# Patient Record
Sex: Female | Born: 1945 | Race: White | Hispanic: No | Smoking: Never smoker
Health system: Southern US, Community
[De-identification: ages and names within clinical notes are randomized; demographics above are authoritative.]

## PROBLEM LIST (undated history)

## (undated) DIAGNOSIS — G459 Transient cerebral ischemic attack, unspecified: Secondary | ICD-10-CM

## (undated) DIAGNOSIS — M1990 Unspecified osteoarthritis, unspecified site: Secondary | ICD-10-CM

## (undated) DIAGNOSIS — I10 Essential (primary) hypertension: Secondary | ICD-10-CM

## (undated) DIAGNOSIS — F329 Major depressive disorder, single episode, unspecified: Secondary | ICD-10-CM

## (undated) DIAGNOSIS — E785 Hyperlipidemia, unspecified: Secondary | ICD-10-CM

## (undated) DIAGNOSIS — R112 Nausea with vomiting, unspecified: Secondary | ICD-10-CM

## (undated) DIAGNOSIS — Z8489 Family history of other specified conditions: Secondary | ICD-10-CM

## (undated) DIAGNOSIS — I639 Cerebral infarction, unspecified: Secondary | ICD-10-CM

## (undated) DIAGNOSIS — T7840XA Allergy, unspecified, initial encounter: Secondary | ICD-10-CM

## (undated) DIAGNOSIS — R160 Hepatomegaly, not elsewhere classified: Secondary | ICD-10-CM

## (undated) DIAGNOSIS — M797 Fibromyalgia: Secondary | ICD-10-CM

## (undated) DIAGNOSIS — K7460 Unspecified cirrhosis of liver: Secondary | ICD-10-CM

## (undated) DIAGNOSIS — I700 Atherosclerosis of aorta: Secondary | ICD-10-CM

## (undated) DIAGNOSIS — R42 Dizziness and giddiness: Secondary | ICD-10-CM

## (undated) DIAGNOSIS — T753XXA Motion sickness, initial encounter: Secondary | ICD-10-CM

## (undated) DIAGNOSIS — E039 Hypothyroidism, unspecified: Secondary | ICD-10-CM

## (undated) DIAGNOSIS — K219 Gastro-esophageal reflux disease without esophagitis: Secondary | ICD-10-CM

## (undated) DIAGNOSIS — E079 Disorder of thyroid, unspecified: Secondary | ICD-10-CM

## (undated) DIAGNOSIS — E119 Type 2 diabetes mellitus without complications: Secondary | ICD-10-CM

## (undated) DIAGNOSIS — Z9889 Other specified postprocedural states: Secondary | ICD-10-CM

## (undated) DIAGNOSIS — R519 Headache, unspecified: Secondary | ICD-10-CM

## (undated) DIAGNOSIS — R51 Headache: Secondary | ICD-10-CM

## (undated) DIAGNOSIS — F32A Depression, unspecified: Secondary | ICD-10-CM

## (undated) HISTORY — PX: SPINE SURGERY: SHX786

## (undated) HISTORY — DX: Disorder of thyroid, unspecified: E07.9

## (undated) HISTORY — DX: Depression, unspecified: F32.A

## (undated) HISTORY — PX: CARPAL TUNNEL RELEASE: SHX101

## (undated) HISTORY — PX: ABDOMINAL HYSTERECTOMY: SHX81

## (undated) HISTORY — DX: Hepatomegaly, not elsewhere classified: R16.0

## (undated) HISTORY — DX: Hyperlipidemia, unspecified: E78.5

## (undated) HISTORY — DX: Essential (primary) hypertension: I10

## (undated) HISTORY — DX: Gastro-esophageal reflux disease without esophagitis: K21.9

## (undated) HISTORY — DX: Allergy, unspecified, initial encounter: T78.40XA

## (undated) HISTORY — DX: Atherosclerosis of aorta: I70.0

## (undated) HISTORY — DX: Fibromyalgia: M79.7

## (undated) HISTORY — DX: Unspecified cirrhosis of liver: K74.60

## (undated) HISTORY — DX: Major depressive disorder, single episode, unspecified: F32.9

## (undated) HISTORY — DX: Cerebral infarction, unspecified: I63.9

---

## 1973-06-29 HISTORY — PX: VARICOSE VEIN SURGERY: SHX832

## 2004-07-07 ENCOUNTER — Emergency Department: Payer: Self-pay | Admitting: General Practice

## 2005-10-15 ENCOUNTER — Emergency Department: Payer: Self-pay | Admitting: Emergency Medicine

## 2007-09-26 ENCOUNTER — Other Ambulatory Visit: Payer: Self-pay

## 2007-09-26 ENCOUNTER — Inpatient Hospital Stay: Payer: Self-pay | Admitting: Internal Medicine

## 2008-08-15 ENCOUNTER — Ambulatory Visit: Payer: Self-pay | Admitting: Gastroenterology

## 2009-01-01 ENCOUNTER — Emergency Department (HOSPITAL_COMMUNITY): Admission: EM | Admit: 2009-01-01 | Discharge: 2009-01-01 | Payer: Self-pay | Admitting: Emergency Medicine

## 2009-05-26 ENCOUNTER — Emergency Department: Payer: Self-pay | Admitting: Unknown Physician Specialty

## 2010-01-18 ENCOUNTER — Emergency Department: Payer: Self-pay | Admitting: Emergency Medicine

## 2010-02-12 ENCOUNTER — Encounter: Payer: Self-pay | Admitting: Anesthesiology

## 2010-02-27 ENCOUNTER — Encounter: Payer: Self-pay | Admitting: Anesthesiology

## 2010-03-29 ENCOUNTER — Encounter: Payer: Self-pay | Admitting: Anesthesiology

## 2010-10-05 LAB — URINE MICROSCOPIC-ADD ON

## 2010-10-05 LAB — URINALYSIS, ROUTINE W REFLEX MICROSCOPIC
Bilirubin Urine: NEGATIVE
Glucose, UA: NEGATIVE mg/dL
Hgb urine dipstick: NEGATIVE
Ketones, ur: NEGATIVE mg/dL
Nitrite: NEGATIVE
Protein, ur: NEGATIVE mg/dL
Specific Gravity, Urine: 1.014 (ref 1.005–1.030)
Urobilinogen, UA: 0.2 mg/dL (ref 0.0–1.0)
pH: 6 (ref 5.0–8.0)

## 2010-10-05 LAB — BASIC METABOLIC PANEL
BUN: 18 mg/dL (ref 6–23)
CO2: 28 mEq/L (ref 19–32)
Calcium: 9.1 mg/dL (ref 8.4–10.5)
Chloride: 105 mEq/L (ref 96–112)
Creatinine, Ser: 1.02 mg/dL (ref 0.4–1.2)
GFR calc Af Amer: >60 mL/min (ref >60)
GFR calc non Af Amer: 55 mL/min — ABNORMAL LOW (ref >60)
Glucose, Bld: 181 mg/dL — ABNORMAL HIGH (ref 70–99)
Potassium: 4.1 mEq/L (ref 3.5–5.1)
Sodium: 140 mEq/L (ref 135–145)

## 2010-10-05 LAB — DIFFERENTIAL
Basophils Absolute: 0 10*3/uL (ref 0.0–0.1)
Basophils Relative: 1 % (ref 0–1)
Eosinophils Absolute: 0.2 10*3/uL (ref 0.0–0.7)
Eosinophils Relative: 3 % (ref 0–5)
Lymphocytes Relative: 37 % (ref 12–46)
Lymphs Abs: 2.3 10*3/uL (ref 0.7–4.0)
Monocytes Absolute: 0.4 10*3/uL (ref 0.1–1.0)
Monocytes Relative: 7 % (ref 3–12)
Neutro Abs: 3.3 10*3/uL (ref 1.7–7.7)
Neutrophils Relative %: 54 % (ref 43–77)

## 2010-10-05 LAB — POCT CARDIAC MARKERS
CKMB, poc: <1 ng/mL — ABNORMAL LOW (ref 1.0–8.0)
Myoglobin, poc: 88.8 ng/mL (ref 12–200)
Troponin i, poc: <0.05 ng/mL (ref 0.00–0.09)

## 2010-10-05 LAB — CBC
HCT: 38 % (ref 36.0–46.0)
Hemoglobin: 13.1 g/dL (ref 12.0–15.0)
MCHC: 34.6 g/dL (ref 30.0–36.0)
MCV: 91.3 fL (ref 78.0–100.0)
Platelets: 210 10*3/uL (ref 150–400)
RBC: 4.16 MIL/uL (ref 3.87–5.11)
RDW: 12.3 % (ref 11.5–15.5)
WBC: 6.2 10*3/uL (ref 4.0–10.5)

## 2012-03-03 ENCOUNTER — Emergency Department: Payer: Self-pay | Admitting: Emergency Medicine

## 2012-03-03 LAB — COMPREHENSIVE METABOLIC PANEL
Albumin: 3.9 g/dL (ref 3.4–5.0)
Alkaline Phosphatase: 50 U/L (ref 50–136)
Anion Gap: 6 — ABNORMAL LOW (ref 7–16)
BUN: 25 mg/dL — ABNORMAL HIGH (ref 7–18)
Bilirubin,Total: 0.3 mg/dL (ref 0.2–1.0)
Calcium, Total: 9.6 mg/dL (ref 8.5–10.1)
Chloride: 104 mmol/L (ref 98–107)
Co2: 29 mmol/L (ref 21–32)
Creatinine: 1.26 mg/dL (ref 0.60–1.30)
EGFR (African American): 51 — ABNORMAL LOW
EGFR (Non-African Amer.): 44 — ABNORMAL LOW
Glucose: 146 mg/dL — ABNORMAL HIGH (ref 65–99)
Osmolality: 285 (ref 275–301)
Potassium: 4.5 mmol/L (ref 3.5–5.1)
SGOT(AST): 59 U/L — ABNORMAL HIGH (ref 15–37)
SGPT (ALT): 49 U/L (ref 12–78)
Sodium: 139 mmol/L (ref 136–145)
Total Protein: 8.2 g/dL (ref 6.4–8.2)

## 2012-03-03 LAB — CBC WITH DIFFERENTIAL/PLATELET
Basophil #: 0.1 10*3/uL (ref 0.0–0.1)
Basophil %: 0.9 %
Eosinophil #: 0.3 10*3/uL (ref 0.0–0.7)
Eosinophil %: 2.9 %
HCT: 39.9 % (ref 35.0–47.0)
HGB: 13.6 g/dL (ref 12.0–16.0)
Lymphocyte #: 4.3 10*3/uL — ABNORMAL HIGH (ref 1.0–3.6)
Lymphocyte %: 46.2 %
MCH: 30.4 pg (ref 26.0–34.0)
MCHC: 34 g/dL (ref 32.0–36.0)
MCV: 89 fL (ref 80–100)
Monocyte #: 0.6 x10 3/mm (ref 0.2–0.9)
Monocyte %: 6 %
Neutrophil #: 4.1 10*3/uL (ref 1.4–6.5)
Neutrophil %: 44 %
Platelet: 245 10*3/uL (ref 150–440)
RBC: 4.46 10*6/uL (ref 3.80–5.20)
RDW: 12.9 % (ref 11.5–14.5)
WBC: 9.2 10*3/uL (ref 3.6–11.0)

## 2012-03-03 LAB — TROPONIN I: Troponin-I: <0.02 ng/mL

## 2012-03-03 LAB — URINALYSIS, COMPLETE
Bacteria: NONE SEEN
Bilirubin,UR: NEGATIVE
Blood: NEGATIVE
Glucose,UR: NEGATIVE mg/dL (ref 0–75)
Ketone: NEGATIVE
Leukocyte Esterase: NEGATIVE
Nitrite: NEGATIVE
Ph: 5 (ref 4.5–8.0)
Protein: NEGATIVE
RBC,UR: <1 /HPF (ref 0–5)
Specific Gravity: 1.016 (ref 1.003–1.030)
Squamous Epithelial: 1
WBC UR: <1 /HPF (ref 0–5)

## 2012-03-03 LAB — CK TOTAL AND CKMB (NOT AT ARMC)
CK, Total: 91 U/L (ref 21–215)
CK-MB: 0.8 ng/mL (ref 0.5–3.6)

## 2012-03-03 LAB — APTT: Activated PTT: 27.9 secs (ref 23.6–35.9)

## 2012-03-03 LAB — PROTIME-INR
INR: 0.9
Prothrombin Time: 12.6 secs (ref 11.5–14.7)

## 2012-05-10 ENCOUNTER — Ambulatory Visit: Payer: Self-pay | Admitting: Gastroenterology

## 2012-07-07 ENCOUNTER — Ambulatory Visit: Payer: Self-pay | Admitting: Family Medicine

## 2012-07-30 ENCOUNTER — Ambulatory Visit: Payer: Self-pay | Admitting: Family Medicine

## 2012-09-16 ENCOUNTER — Ambulatory Visit: Payer: Self-pay | Admitting: Family Medicine

## 2013-01-08 ENCOUNTER — Emergency Department: Payer: Self-pay | Admitting: Emergency Medicine

## 2013-01-10 ENCOUNTER — Emergency Department: Payer: Self-pay | Admitting: Emergency Medicine

## 2013-01-10 LAB — URINALYSIS, COMPLETE
Bacteria: NONE SEEN
Bilirubin,UR: NEGATIVE
Blood: NEGATIVE
Glucose,UR: NEGATIVE mg/dL (ref 0–75)
Ketone: NEGATIVE
Nitrite: NEGATIVE
Ph: 5 (ref 4.5–8.0)
Protein: NEGATIVE
RBC,UR: <1 /HPF (ref 0–5)
Specific Gravity: 1.012 (ref 1.003–1.030)
Squamous Epithelial: 1
WBC UR: 1 /HPF (ref 0–5)

## 2013-01-10 LAB — COMPREHENSIVE METABOLIC PANEL
Albumin: 3.6 g/dL (ref 3.4–5.0)
Alkaline Phosphatase: 65 U/L (ref 50–136)
Anion Gap: 8 (ref 7–16)
BUN: 18 mg/dL (ref 7–18)
Bilirubin,Total: 0.3 mg/dL (ref 0.2–1.0)
Calcium, Total: 9.8 mg/dL (ref 8.5–10.1)
Chloride: 105 mmol/L (ref 98–107)
Co2: 28 mmol/L (ref 21–32)
Creatinine: 0.91 mg/dL (ref 0.60–1.30)
EGFR (African American): >60
EGFR (Non-African Amer.): >60
Glucose: 110 mg/dL — ABNORMAL HIGH (ref 65–99)
Osmolality: 284 (ref 275–301)
Potassium: 4.1 mmol/L (ref 3.5–5.1)
SGOT(AST): 118 U/L — ABNORMAL HIGH (ref 15–37)
SGPT (ALT): 64 U/L (ref 12–78)
Sodium: 141 mmol/L (ref 136–145)
Total Protein: 8.2 g/dL (ref 6.4–8.2)

## 2013-01-10 LAB — CBC
HCT: 36.9 % (ref 35.0–47.0)
HGB: 12.2 g/dL (ref 12.0–16.0)
MCH: 28.5 pg (ref 26.0–34.0)
MCHC: 33 g/dL (ref 32.0–36.0)
MCV: 86 fL (ref 80–100)
Platelet: 216 10*3/uL (ref 150–440)
RBC: 4.27 10*6/uL (ref 3.80–5.20)
RDW: 13.1 % (ref 11.5–14.5)
WBC: 8.9 10*3/uL (ref 3.6–11.0)

## 2013-01-10 LAB — TROPONIN I: Troponin-I: <0.02 ng/mL

## 2013-04-05 ENCOUNTER — Ambulatory Visit: Payer: Self-pay | Admitting: Gastroenterology

## 2013-04-05 LAB — IRON AND TIBC
Iron Bind.Cap.(Total): 466 ug/dL — ABNORMAL HIGH (ref 250–450)
Iron Saturation: 22 %
Iron: 104 ug/dL (ref 50–170)
Unbound Iron-Bind.Cap.: 362 ug/dL

## 2013-04-05 LAB — FERRITIN: Ferritin (ARMC): 33 ng/mL (ref 8–388)

## 2013-04-11 ENCOUNTER — Encounter: Payer: Self-pay | Admitting: Podiatry

## 2013-04-11 ENCOUNTER — Encounter: Payer: Self-pay | Admitting: *Deleted

## 2013-04-11 ENCOUNTER — Ambulatory Visit (INDEPENDENT_AMBULATORY_CARE_PROVIDER_SITE_OTHER): Payer: Medicare Other | Admitting: Podiatry

## 2013-04-11 ENCOUNTER — Ambulatory Visit (INDEPENDENT_AMBULATORY_CARE_PROVIDER_SITE_OTHER): Payer: Medicare Other

## 2013-04-11 VITALS — BP 134/78 | HR 94 | Resp 18 | Ht 64.0 in | Wt 178.0 lb

## 2013-04-11 DIAGNOSIS — M7750 Other enthesopathy of unspecified foot: Secondary | ICD-10-CM

## 2013-04-11 DIAGNOSIS — R52 Pain, unspecified: Secondary | ICD-10-CM

## 2013-04-11 DIAGNOSIS — R609 Edema, unspecified: Secondary | ICD-10-CM

## 2013-04-11 MED ORDER — TRIAMCINOLONE ACETONIDE 10 MG/ML IJ SUSP
5.0000 mg | Freq: Once | INTRAMUSCULAR | Status: AC
  Administered 2013-04-11: 5 mg via INTRA_ARTICULAR

## 2013-04-11 NOTE — Progress Notes (Signed)
N sore   L left foot heel going up leg D last thursday O all of sudden  C worse  A on it  T  Wore air fracture walker , compression hose , ice

## 2013-04-11 NOTE — Progress Notes (Signed)
Subjective:     Patient ID: Charlotte Herrera, female   DOB: 24-Mar-1946, 67 y.o.   MRN: [id number]  Foot Pain   patient states my left plantar HEEL is doing well but the ankle and the outside of my foot is very sore. States it has been progressing over the last 4 weeks she has tried to wear her walking boot and use compression hose   Review of Systems  All other systems reviewed and are negative.       Objective:   Physical Exam  Nursing note and vitals reviewed. Constitutional: She appears well-developed and well-nourished.  Cardiovascular: Intact distal pulses.   Musculoskeletal: Normal range of motion.  Skin: Skin is warm.   Patient has pain in the sinus tarsi left and into the lateral ankle gutter when pressed. The plantar heel is doing well at the current time from surgery    Assessment:     Sinus tarsitis left with lateral inflammation which may be due to compensation for the healing of the heel or separate prop    Plan:     Explained nature of this condition and did separate H&P. Injected the left sinus tarsi and lateral ankle gutter 3 mg Kenalog 5 mg Xylocaine Marcaine mixture

## 2013-05-02 ENCOUNTER — Ambulatory Visit (INDEPENDENT_AMBULATORY_CARE_PROVIDER_SITE_OTHER): Payer: Medicare Other | Admitting: Podiatry

## 2013-05-02 ENCOUNTER — Encounter: Payer: Self-pay | Admitting: Podiatry

## 2013-05-02 VITALS — BP 132/78 | HR 94 | Resp 16 | Ht 64.0 in | Wt 176.0 lb

## 2013-05-02 DIAGNOSIS — M779 Enthesopathy, unspecified: Secondary | ICD-10-CM

## 2013-05-02 NOTE — Progress Notes (Signed)
Subjective:     Patient ID: Charlotte Herrera, female   DOB: August 27, 1945, 67 y.o.   MRN: [id number]  HPI patient states my foot is feeling much better   Review of Systems     Objective:   Physical Exam  Nursing note and vitals reviewed. Constitutional: She is oriented to person, place, and time.  Cardiovascular: Intact distal pulses.   Musculoskeletal: Normal range of motion.  Neurological: She is oriented to person, place, and time.  Skin: Skin is warm.   patient's left sinus tarsi is much improved with pressure with good range of motion     Assessment:     Sinus tarsitis left that is improving    Plan:     Advised on physical therapy and supportive shoe gear. Reappoint if need

## 2013-05-04 ENCOUNTER — Ambulatory Visit: Payer: Self-pay | Admitting: Gastroenterology

## 2013-06-29 HISTORY — PX: COLONOSCOPY: SHX174

## 2013-07-30 ENCOUNTER — Emergency Department: Payer: Self-pay | Admitting: Emergency Medicine

## 2013-07-30 LAB — TROPONIN I: Troponin-I: <0.02 ng/mL

## 2013-07-30 LAB — CBC WITH DIFFERENTIAL/PLATELET
Basophil #: 0.1 10*3/uL (ref 0.0–0.1)
Basophil %: 0.9 %
Eosinophil #: 0.2 10*3/uL (ref 0.0–0.7)
Eosinophil %: 1.8 %
HCT: 39 % (ref 35.0–47.0)
HGB: 13 g/dL (ref 12.0–16.0)
Lymphocyte #: 3.9 10*3/uL — ABNORMAL HIGH (ref 1.0–3.6)
Lymphocyte %: 37.7 %
MCH: 28.7 pg (ref 26.0–34.0)
MCHC: 33.4 g/dL (ref 32.0–36.0)
MCV: 86 fL (ref 80–100)
Monocyte #: 0.6 x10 3/mm (ref 0.2–0.9)
Monocyte %: 5.7 %
Neutrophil #: 5.6 10*3/uL (ref 1.4–6.5)
Neutrophil %: 53.9 %
Platelet: 297 10*3/uL (ref 150–440)
RBC: 4.55 10*6/uL (ref 3.80–5.20)
RDW: 13.9 % (ref 11.5–14.5)
WBC: 10.3 10*3/uL (ref 3.6–11.0)

## 2013-07-30 LAB — COMPREHENSIVE METABOLIC PANEL
Albumin: 3 g/dL — ABNORMAL LOW (ref 3.4–5.0)
Alkaline Phosphatase: 68 U/L
Anion Gap: 6 — ABNORMAL LOW (ref 7–16)
BUN: 31 mg/dL — ABNORMAL HIGH (ref 7–18)
Bilirubin,Total: 0.3 mg/dL (ref 0.2–1.0)
Calcium, Total: 9.8 mg/dL (ref 8.5–10.1)
Chloride: 104 mmol/L (ref 98–107)
Co2: 24 mmol/L (ref 21–32)
Creatinine: 1.32 mg/dL — ABNORMAL HIGH (ref 0.60–1.30)
EGFR (African American): 48 — ABNORMAL LOW
EGFR (Non-African Amer.): 41 — ABNORMAL LOW
Glucose: 136 mg/dL — ABNORMAL HIGH (ref 65–99)
Osmolality: 277 (ref 275–301)
Potassium: 3.9 mmol/L (ref 3.5–5.1)
SGOT(AST): 63 U/L — ABNORMAL HIGH (ref 15–37)
SGPT (ALT): 34 U/L (ref 12–78)
Sodium: 134 mmol/L — ABNORMAL LOW (ref 136–145)
Total Protein: 8.5 g/dL — ABNORMAL HIGH (ref 6.4–8.2)

## 2013-07-30 LAB — URINALYSIS, COMPLETE
Bacteria: NONE SEEN
Bilirubin,UR: NEGATIVE
Blood: NEGATIVE
Glucose,UR: NEGATIVE mg/dL (ref 0–75)
Hyaline Cast: 3
Ketone: NEGATIVE
Leukocyte Esterase: NEGATIVE
Nitrite: NEGATIVE
Ph: 5 (ref 4.5–8.0)
Protein: NEGATIVE
RBC,UR: 1 /HPF (ref 0–5)
Specific Gravity: 1.019 (ref 1.003–1.030)
Squamous Epithelial: 1
WBC UR: 2 /HPF (ref 0–5)

## 2013-07-30 LAB — LIPASE, BLOOD: Lipase: 92 U/L (ref 73–393)

## 2013-08-01 ENCOUNTER — Emergency Department: Payer: Self-pay | Admitting: Emergency Medicine

## 2013-08-01 LAB — DRUG SCREEN, URINE

## 2013-08-01 LAB — COMPREHENSIVE METABOLIC PANEL
Albumin: 3.4 g/dL (ref 3.4–5.0)
Alkaline Phosphatase: 69 U/L
Anion Gap: 7 (ref 7–16)
BUN: 21 mg/dL — ABNORMAL HIGH (ref 7–18)
Bilirubin,Total: 0.2 mg/dL (ref 0.2–1.0)
Calcium, Total: 9.7 mg/dL (ref 8.5–10.1)
Chloride: 105 mmol/L (ref 98–107)
Co2: 28 mmol/L (ref 21–32)
Creatinine: 1.09 mg/dL (ref 0.60–1.30)
EGFR (African American): >60
EGFR (Non-African Amer.): 52 — ABNORMAL LOW
Glucose: 91 mg/dL (ref 65–99)
Osmolality: 282 (ref 275–301)
Potassium: 4.1 mmol/L (ref 3.5–5.1)
SGOT(AST): 46 U/L — ABNORMAL HIGH (ref 15–37)
SGPT (ALT): 32 U/L (ref 12–78)
Sodium: 140 mmol/L (ref 136–145)
Total Protein: 8.8 g/dL — ABNORMAL HIGH (ref 6.4–8.2)

## 2013-08-01 LAB — CBC
HCT: 39.7 % (ref 35.0–47.0)
HGB: 13.1 g/dL (ref 12.0–16.0)
MCH: 28.6 pg (ref 26.0–34.0)
MCHC: 33 g/dL (ref 32.0–36.0)
MCV: 87 fL (ref 80–100)
Platelet: 289 10*3/uL (ref 150–440)
RBC: 4.58 10*6/uL (ref 3.80–5.20)
RDW: 13.5 % (ref 11.5–14.5)
WBC: 10.7 10*3/uL (ref 3.6–11.0)

## 2013-08-01 LAB — URINALYSIS, COMPLETE
Bacteria: NONE SEEN
Bilirubin,UR: NEGATIVE
Blood: NEGATIVE
Glucose,UR: NEGATIVE mg/dL (ref 0–75)
Ketone: NEGATIVE
Leukocyte Esterase: NEGATIVE
Nitrite: NEGATIVE
Ph: 5 (ref 4.5–8.0)
Protein: NEGATIVE
RBC,UR: <1 /HPF (ref 0–5)
Specific Gravity: 1.01 (ref 1.003–1.030)
Squamous Epithelial: 1
WBC UR: <1 /HPF (ref 0–5)

## 2013-08-01 LAB — ETHANOL
Ethanol %: <0.003 % (ref 0.000–0.080)
Ethanol: <3 mg/dL

## 2013-08-01 LAB — SALICYLATE LEVEL: Salicylates, Serum: <1.7 mg/dL

## 2013-08-01 LAB — TSH: Thyroid Stimulating Horm: 1.64 u[IU]/mL

## 2013-08-01 LAB — ACETAMINOPHEN LEVEL: Acetaminophen: <2 ug/mL

## 2013-08-02 LAB — AMMONIA: Ammonia, Plasma: 19 mcmol/L (ref 11–32)

## 2013-08-11 ENCOUNTER — Ambulatory Visit: Payer: Medicare Other | Admitting: Podiatry

## 2013-09-01 ENCOUNTER — Ambulatory Visit (INDEPENDENT_AMBULATORY_CARE_PROVIDER_SITE_OTHER): Payer: Commercial Managed Care - HMO | Admitting: Podiatry

## 2013-09-01 VITALS — BP 149/79 | HR 84 | Resp 16 | Ht 65.0 in | Wt 171.0 lb

## 2013-09-01 DIAGNOSIS — M7750 Other enthesopathy of unspecified foot: Secondary | ICD-10-CM

## 2013-09-01 DIAGNOSIS — B351 Tinea unguium: Secondary | ICD-10-CM

## 2013-09-01 DIAGNOSIS — M79609 Pain in unspecified limb: Secondary | ICD-10-CM

## 2013-09-01 MED ORDER — TRIAMCINOLONE ACETONIDE 10 MG/ML IJ SUSP
10.0000 mg | Freq: Once | INTRAMUSCULAR | Status: AC
  Administered 2013-09-01: 10 mg

## 2013-09-01 NOTE — Progress Notes (Signed)
Subjective:     Patient ID: Charlotte Herrera, female   DOB: 1946/06/17, 68 y.o.   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

## 2013-11-03 ENCOUNTER — Ambulatory Visit: Payer: Self-pay | Admitting: Urgent Care

## 2013-12-01 ENCOUNTER — Ambulatory Visit: Payer: Commercial Managed Care - HMO | Admitting: Podiatry

## 2013-12-14 ENCOUNTER — Ambulatory Visit: Payer: Self-pay | Admitting: Family Medicine

## 2014-01-27 ENCOUNTER — Other Ambulatory Visit: Payer: Self-pay | Admitting: Urgent Care

## 2014-01-27 LAB — HEPATIC FUNCTION PANEL A (ARMC)
Albumin: 3.5 g/dL (ref 3.4–5.0)
Alkaline Phosphatase: 54 U/L
Bilirubin, Direct: <0.1 mg/dL (ref 0.00–0.20)
Bilirubin,Total: 0.4 mg/dL (ref 0.2–1.0)
SGOT(AST): 57 U/L — ABNORMAL HIGH (ref 15–37)
SGPT (ALT): 45 U/L
Total Protein: 8 g/dL (ref 6.4–8.2)

## 2014-10-20 NOTE — Consult Note (Signed)
PATIENT NAME:  Charlotte Herrera, Charlotte Herrera MR#:  [id number] DATE OF BIRTH:  06/16/1946  DATE OF CONSULTATION:  08/02/2013  CONSULTING PHYSICIAN:  Gonzella Lex, MD  IDENTIFYING INFORMATION AND REASON FOR CONSULTATION: A 69 year old woman brought to the Emergency Room yesterday by her daughter because of acute mental status change with crying spells. Consultation for psychiatric evaluation.   HISTORY OF PRESENT ILLNESS: The patient's chief complaint "I have just been having crying spells." She reports that she has had multiple symptoms consistent with depression recently. For the last six months her energy level has been low. For the last two months she has been having crying spells which happened more commonly at night, but have happened at other times during the day as well. For several months she has lost interest in normal activities. She feels overwhelmed by her job. She denies having any suicidal ideation, but does have a sense of hopelessness. Symptoms seem to have been made worse by the cold weather. She has talked to her primary care doctor about it and he prescribed an antidepressant, but she did not start it yet. The patient is not drinking alcohol or abusing drugs and says she has been compliant with other medical treatment.   PAST PSYCHIATRIC HISTORY: She indicates that a couple of decades ago she also went through a period of depression, but has no history of suicidal ideation or behavior and no history of psychiatric hospitalization and cannot recall being on antidepressant medicine before. She is currently taking 0.25 mg of alprazolam twice a day p.r.n. from her primary care doctor for anxiety symptoms.   SOCIAL HISTORY: The patient is divorced. Lives alone, but her daughter lives in the half of the duplex right above her. She has two adult daughters who are actively involved in her life. 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 She used to to enjoy her job and find it easy.  Recently she feels more stressed out about it. She has a sister who has cancer and is getting chemotherapy and that has also been a cause of emotional stress.   PAST MEDICAL HISTORY: The patient has diabetes. Also, a history of coronary artery disease, also high blood pressure, hypothyroidism, chronic headaches.   FAMILY HISTORY: She thinks that she had a brother who had some depression problems after the war in [place], but does not know of any family members with a suicide history.   REVIEW OF SYSTEMS: The patient endorses depressed mood, poor interrupted sleep, low appetite with weight loss, low energy, loss of interest in normal activities. She denies auditory or visual hallucinations and denies paranoia or delusions. Denies any nausea or vomiting. Denies current pain. Denies malaise. She does say that she sometimes gets hot flashes intermittently. She has had recurrent sinus infections diagnosed by her primary care doctor. The rest of the 10- point review of systems is negative.   MENTAL STATUS EXAM: Slightly disheveled woman, looks her stated age or older. Passively cooperative with the interview. Psychomotor activity slow and sluggish. Eye contact poor. Speech quiet and very slow. Thoughts slow and hampered. She is alert and oriented x 4. Short term memory intact, Rudie-term memory appears intact. Appears to have normal fund of knowledge. She has a flat, dysphoric, affect and tearfulness at some points. Mood is described as depressed. Thoughts are lucid but slow. No obvious loosening of associations or delusions. Denies auditory or visual hallucinations. Denies any suicidal thoughts whatsoever. Denies homicidal ideation. Judgment and insight appear adequate.  LABORATORY RESULTS: Reviewed lab from yesterday. A CAT scan showed atrophy and an old stroke, but no recent changes. Drug screen positive for benzodiazepines. TSH normal at 1.6. No alcohol level. BUN slightly elevated at 21, creatinine normal.  AST slightly elevated at 46. CBC unremarkable. Urinalysis unremarkable. No sign of infection.   VITAL SIGNS: Most recent vitals include blood pressure 149/66, respirations 18, pulse 76, temperature 97.2.   ASSESSMENT AND MEDICAL DECISION MAKING: After review of full history as detailed above and review of old records the patient appears to have a major depressive episode, single episode, moderate severity. She does not currently have active suicidal ideation. There had been some concern about sluggishness and self-care, but at this point, she is not showing signs of acute dangerousness that would make her committable. The patient does require treatment. Medically, her blood pressure looks fine, her labs are fine. We reviewed CAT scan and there is no sign of a stroke which had initially been a concern I had. Therefore, I have ruled out stroke, rule out dementia as well as major depression. Proper intervention would include beginning antidepressant medicine. I have prescribed Lexapro and printed out 10 mg a day. The patient has been educated about medication usage. She will be referred back to her primary care doctor and also given some follow-up information for psychiatric treatment by the discharge coordinator. Discussed the case with ER doctor.   DIAGNOSIS, PRINCIPAL AND PRIMARY:  AXIS I: Major depression, single, moderate.   SECONDARY DIAGNOSES: AXIS I: No further diagnosis.  AXIS II: Deferred.  AXIS III: Diabetes, high blood pressure, coronary artery disease, rule out stroke. History of past stroke.  AXIS IV: Moderate.  AXIS V: Functioning at time of evaluation: 41.  ____________________________ Gonzella Lex, MD jtc:sg D: 08/02/2013 12:22:24 ET T: 08/02/2013 12:46:48 ET JOB#: [id number]  cc: Gonzella Lex, MD, <Dictator> Gonzella Lex MD ELECTRONICALLY SIGNED 08/02/2013 16:51

## 2014-10-20 NOTE — Consult Note (Signed)
Brief Consult Note: Comments: Psychiatry: Chart reviewed and case discussed with behavioral health intake nurse. 69 year old woman with a past history of TIA and no past psychiatric history presents with new onset uncontrolled crying, confusion. History sounds at least as likely to be neurological disease as primary psychiatric.Symptoms consistant with pseudobulbar affect typical of neurologic insult. Needs head scan at least. Would not advise jumping to the conclusion of admission to [facility] until further workup..  Electronic Signatures: Gonzella Lex (MD)  (Signed 03-Feb-15 22:43)  Authored: Brief Consult Note   Last Updated: 03-Feb-15 22:43 by Gonzella Lex (MD)

## 2014-10-20 NOTE — Consult Note (Signed)
Brief Consult Note: Diagnosis: major depression single moderate.   Patient was seen by consultant.   Consult note dictated.   Recommend further assessment or treatment.   Orders entered.   Discussed with Attending MD.   Comments: Psychiatry: Patient seen. chart reviewed. Note dictated. Patient appears to have major depression but is not suicidal or acutely dangerous. Advise starting lexapro. Patient agrees. Sscript done. See full note. Follow up with PCP and outpt provider per dc coordinator.  Electronic Signatures: Gonzella Lex (MD)  (Signed 04-Feb-15 12:14)  Authored: Brief Consult Note   Last Updated: 04-Feb-15 12:14 by Gonzella Lex (MD)

## 2014-12-07 ENCOUNTER — Other Ambulatory Visit: Payer: Self-pay

## 2014-12-07 DIAGNOSIS — E119 Type 2 diabetes mellitus without complications: Secondary | ICD-10-CM

## 2014-12-07 MED ORDER — GLUCOSE BLOOD VI STRP: ORAL_STRIP | Status: DC

## 2014-12-07 MED ORDER — INSULIN PEN NEEDLE 31G X 5 MM MISC: 1.0000 | Freq: Every day | Status: DC

## 2014-12-25 ENCOUNTER — Ambulatory Visit (INDEPENDENT_AMBULATORY_CARE_PROVIDER_SITE_OTHER): Payer: PPO | Admitting: Family Medicine

## 2014-12-25 ENCOUNTER — Encounter: Payer: Self-pay | Admitting: Family Medicine

## 2014-12-25 VITALS — BP 118/68 | HR 104 | Temp 98.0°F | Ht 64.0 in | Wt 175.2 lb

## 2014-12-25 DIAGNOSIS — F419 Anxiety disorder, unspecified: Secondary | ICD-10-CM | POA: Insufficient documentation

## 2014-12-25 DIAGNOSIS — F411 Generalized anxiety disorder: Secondary | ICD-10-CM | POA: Diagnosis not present

## 2014-12-25 DIAGNOSIS — B372 Candidiasis of skin and nail: Secondary | ICD-10-CM | POA: Diagnosis not present

## 2014-12-25 DIAGNOSIS — IMO0002 Reserved for concepts with insufficient information to code with codable children: Secondary | ICD-10-CM | POA: Insufficient documentation

## 2014-12-25 DIAGNOSIS — Z8673 Personal history of transient ischemic attack (TIA), and cerebral infarction without residual deficits: Secondary | ICD-10-CM | POA: Diagnosis not present

## 2014-12-25 DIAGNOSIS — Z87898 Personal history of other specified conditions: Secondary | ICD-10-CM | POA: Insufficient documentation

## 2014-12-25 DIAGNOSIS — E1165 Type 2 diabetes mellitus with hyperglycemia: Secondary | ICD-10-CM

## 2014-12-25 DIAGNOSIS — E559 Vitamin D deficiency, unspecified: Secondary | ICD-10-CM | POA: Diagnosis not present

## 2014-12-25 DIAGNOSIS — E785 Hyperlipidemia, unspecified: Secondary | ICD-10-CM

## 2014-12-25 DIAGNOSIS — Z794 Long term (current) use of insulin: Secondary | ICD-10-CM

## 2014-12-25 DIAGNOSIS — E538 Deficiency of other specified B group vitamins: Secondary | ICD-10-CM

## 2014-12-25 DIAGNOSIS — E119 Type 2 diabetes mellitus without complications: Secondary | ICD-10-CM | POA: Insufficient documentation

## 2014-12-25 DIAGNOSIS — E1149 Type 2 diabetes mellitus with other diabetic neurological complication: Secondary | ICD-10-CM

## 2014-12-25 DIAGNOSIS — R748 Abnormal levels of other serum enzymes: Secondary | ICD-10-CM | POA: Insufficient documentation

## 2014-12-25 DIAGNOSIS — E1142 Type 2 diabetes mellitus with diabetic polyneuropathy: Secondary | ICD-10-CM | POA: Insufficient documentation

## 2014-12-25 DIAGNOSIS — Z8659 Personal history of other mental and behavioral disorders: Secondary | ICD-10-CM | POA: Insufficient documentation

## 2014-12-25 MED ORDER — METFORMIN HCL 500 MG PO TABS: 500.0000 mg | ORAL_TABLET | Freq: Two times a day (BID) | ORAL | Status: DC

## 2014-12-25 MED ORDER — CLOPIDOGREL BISULFATE 75 MG PO TABS: 75.0000 mg | ORAL_TABLET | Freq: Every day | ORAL | Status: DC

## 2014-12-25 MED ORDER — ALPRAZOLAM 0.5 MG PO TABS: 0.5000 mg | ORAL_TABLET | Freq: Two times a day (BID) | ORAL | Status: DC | PRN

## 2014-12-25 MED ORDER — KETOCONAZOLE 2 % EX CREA: 1.0000 "application " | TOPICAL_CREAM | Freq: Every day | CUTANEOUS | Status: DC

## 2014-12-25 NOTE — Progress Notes (Signed)
Name: Charlotte Herrera   MRN: [id number]    DOB: [birth year]   Date:12/25/2014       Progress Note  Subjective  Chief Complaint  Chief Complaint  Patient presents with  . Follow-up    diabetes    Diabetes She presents for her follow-up diabetic visit. She has type 2 diabetes mellitus. Her disease course has been stable. Hypoglycemia symptoms include nervousness/anxiousness. Associated symptoms include polydipsia. Pertinent negatives for diabetes include no chest pain and no polyuria. There are no hypoglycemic complications. Diabetic complications include a CVA (hx of TIA.) and peripheral neuropathy. Pertinent negatives for diabetic complications include no heart disease. Risk factors for coronary artery disease include dyslipidemia. Current diabetic treatment includes intensive insulin program and oral agent (monotherapy). Her weight is stable. She is following a diabetic diet. She participates in exercise weekly. There is no change in her home blood glucose trend. Her breakfast blood glucose is taken between 6-7 am. Her breakfast blood glucose range is generally 90-110 mg/dl. An ACE inhibitor/angiotensin II receptor blocker is being taken.  Hyperlipidemia This is a chronic problem. Recent lipid tests were reviewed and are high. Associated symptoms include leg pain. Pertinent negatives include no chest pain or myalgias. She is currently on no antihyperlipidemic treatment. Risk factors for coronary artery disease include diabetes mellitus, dyslipidemia and family history.  Anxiety Presents for follow-up visit. Symptoms include depressed mood, excessive worry and nervous/anxious behavior. Patient reports no chest pain.   Past treatments include benzodiazephines and SSRIs. The treatment provided significant relief. Compliance with prior treatments has been good.      Past Medical History  Diagnosis Date  . Enlarged liver   . Diabetes mellitus without complication   . Hypertension   .  Hyperlipidemia   . GERD (gastroesophageal reflux disease)   . Thyroid disease   . Stroke   . Depression   . Fibromyalgia affecting multiple sites     Past Surgical History  Procedure Laterality Date  . Varicose vein surgery Left 1975  . Abdominal hysterectomy    . Spine surgery    . Carpal tunnel release Left     Family History  Problem Relation Age of Onset  . Cancer Mother   . Diabetes Father   . Hearing loss Father   . Cancer Father   . Diabetes Brother   . Cancer Brother     History   Social History  . Marital Status: Divorced    Spouse Name: N/A  . Number of Children: N/A  . Years of Education: N/A   Occupational History  . Not on file.   Social History Main Topics  . Smoking status: Never Smoker   . Smokeless tobacco: Never Used  . Alcohol Use: No  . Drug Use: No  . Sexual Activity: Not on file   Other Topics Concern  . Not on file   Social History Narrative     Current outpatient prescriptions:  .  acetaminophen (TYLENOL) 325 MG tablet, Take 650 mg by mouth daily., Disp: , Rfl:  .  ALPRAZolam (XANAX) 0.5 MG tablet, Take 0.5 mg by mouth. 1/2 IN MORNING AND 1/2 BEDTIME, Disp: , Rfl:  .  clopidogrel (PLAVIX) 75 MG tablet, Take 75 mg by mouth daily., Disp: , Rfl:  .  ergocalciferol (VITAMIN D2) 50000 UNITS capsule, Take 50,000 Units by mouth once a week., Disp: , Rfl:  .  gabapentin (NEURONTIN) 300 MG capsule, Take 300 mg by mouth. ONE OR TWO AT NIGHT, Disp: ,  Rfl:  .  glucose blood (BAYER CONTOUR NEXT TEST) test strip, Test blood sugar twice daily, Disp: 100 each, Rfl: 12 .  insulin glargine (LANTUS) 100 UNIT/ML injection, Inject 80 Units into the skin at bedtime., Disp: , Rfl:  .  Insulin Pen Needle 31G X 5 MM MISC, 1 each by Does not apply route at bedtime., Disp: 100 each, Rfl: 2 .  levothyroxine (SYNTHROID, LEVOTHROID) 50 MCG tablet, Take 50 mcg by mouth daily before breakfast., Disp: , Rfl:  .  losartan-hydrochlorothiazide (HYZAAR) 50-12.5 MG per  tablet, Take 1 tablet by mouth daily., Disp: , Rfl:  .  metFORMIN (GLUCOPHAGE) 500 MG tablet, Take 500 mg by mouth 2 (two) times daily with a meal., Disp: , Rfl:  .  cetirizine (ZYRTEC) 10 MG tablet, Take 10 mg by mouth daily., Disp: , Rfl:  .  escitalopram (LEXAPRO) 10 MG tablet, Take 10 mg by mouth daily., Disp: , Rfl:  .  fluticasone (FLONASE) 50 MCG/ACT nasal spray, Place 2 sprays into the nose daily., Disp: , Rfl:  .  ketoconazole (NIZORAL) 2 % cream, Apply 1 application topically daily., Disp: 30 g, Rfl: 0 .  omeprazole (PRILOSEC) 40 MG capsule, Take 40 mg by mouth daily., Disp: , Rfl:   Allergies  Allergen Reactions  . Aspirin   . Codeine Nausea And Vomiting    RASH  . Duloxetine Hcl   . Lyrica [Pregabalin] Nausea And Vomiting  . Penicillins   . Sulfa Antibiotics Swelling     Review of Systems  Cardiovascular: Negative for chest pain.  Musculoskeletal: Negative for myalgias.  Endo/Heme/Allergies: Positive for polydipsia.  Psychiatric/Behavioral: The patient is nervous/anxious.       Objective  Filed Vitals:   12/25/14 1450  BP: 118/68  Pulse: 104  Temp: 98 F (36.7 C)  TempSrc: Oral  Height: 5\' 4"  (1.626 m)  Weight: 175 lb 3.2 oz (79.47 kg)  SpO2: 94%    Physical Exam  Constitutional: She is oriented to person, place, and time and well-developed, well-nourished, and in no distress.  HENT:  Head: Normocephalic and atraumatic.  Cardiovascular: Normal rate and regular rhythm.   Pulmonary/Chest: Effort normal and breath sounds normal.  Abdominal: Soft. Bowel sounds are normal.  Musculoskeletal: Normal range of motion.  Neurological: She is alert and oriented to person, place, and time.  Skin: Skin is warm and dry.  Nursing note and vitals reviewed.      No results found for this or any previous visit (from the past 2160 hour(s)).   Assessment & Plan 1. Vitamin B12 deficiency Patient has been on vitamin B-12 injections the past which were stopped by  previous PCP. Repeat levels today and follow-up. - B12  2. Vitamin D deficiency  - Vitamin D (25 hydroxy)  3. Type 2 diabetes mellitus with other diabetic neurological complication  - metFORMIN (GLUCOPHAGE) 500 MG tablet; Take 1 tablet (500 mg total) by mouth 2 (two) times daily with a meal.  Dispense: 180 tablet; Refill: 0  4. Dyslipidemia We are awaiting lab work performed 2 weeks ago by [facility] for evaluation of diabetes, cholesterol and metabolic panel.  5. Candidal intertrigo  - ketoconazole (NIZORAL) 2 % cream; Apply 1 application topically daily.  Dispense: 30 g; Refill: 0  6. Hx of transient ischemic attack (TIA)  - clopidogrel (PLAVIX) 75 MG tablet; Take 1 tablet (75 mg total) by mouth daily.  Dispense: 90 tablet; Refill: 1  7. Generalized anxiety disorder Symptoms of anxiety are stable on present  therapy. Refills provided - ALPRAZolam (XANAX) 0.5 MG tablet; Take 1 tablet (0.5 mg total) by mouth 2 (two) times daily as needed for anxiety.  Dispense: 60 tablet; Refill: 2   There are no diagnoses linked to this encounter.  Piero Mustard [facility] 12/25/2014 4:07 PM

## 2015-01-08 ENCOUNTER — Encounter: Payer: Self-pay | Admitting: Family Medicine

## 2015-01-15 ENCOUNTER — Encounter: Payer: Self-pay | Admitting: Family Medicine

## 2015-01-15 ENCOUNTER — Ambulatory Visit
Admission: RE | Admit: 2015-01-15 | Discharge: 2015-01-15 | Disposition: A | Discharge status: Home / Self Care | Payer: PPO | Source: Ambulatory Visit | Attending: Family Medicine | Admitting: Family Medicine

## 2015-01-15 ENCOUNTER — Ambulatory Visit (INDEPENDENT_AMBULATORY_CARE_PROVIDER_SITE_OTHER): Payer: PPO | Admitting: Family Medicine

## 2015-01-15 VITALS — BP 118/70 | HR 92 | Temp 98.0°F | Resp 17 | Ht 64.0 in | Wt 174.8 lb

## 2015-01-15 DIAGNOSIS — E538 Deficiency of other specified B group vitamins: Secondary | ICD-10-CM

## 2015-01-15 DIAGNOSIS — M11261 Other chondrocalcinosis, right knee: Secondary | ICD-10-CM | POA: Insufficient documentation

## 2015-01-15 DIAGNOSIS — M25462 Effusion, left knee: Secondary | ICD-10-CM | POA: Diagnosis not present

## 2015-01-15 DIAGNOSIS — R748 Abnormal levels of other serum enzymes: Secondary | ICD-10-CM | POA: Diagnosis not present

## 2015-01-15 DIAGNOSIS — G8929 Other chronic pain: Secondary | ICD-10-CM

## 2015-01-15 DIAGNOSIS — L989 Disorder of the skin and subcutaneous tissue, unspecified: Secondary | ICD-10-CM

## 2015-01-15 DIAGNOSIS — M25561 Pain in right knee: Principal | ICD-10-CM

## 2015-01-15 DIAGNOSIS — E785 Hyperlipidemia, unspecified: Secondary | ICD-10-CM

## 2015-01-15 DIAGNOSIS — E1142 Type 2 diabetes mellitus with diabetic polyneuropathy: Secondary | ICD-10-CM | POA: Diagnosis not present

## 2015-01-15 DIAGNOSIS — M25562 Pain in left knee: Secondary | ICD-10-CM

## 2015-01-15 DIAGNOSIS — M179 Osteoarthritis of knee, unspecified: Secondary | ICD-10-CM | POA: Insufficient documentation

## 2015-01-15 DIAGNOSIS — E559 Vitamin D deficiency, unspecified: Secondary | ICD-10-CM | POA: Diagnosis not present

## 2015-01-15 DIAGNOSIS — M25761 Osteophyte, right knee: Secondary | ICD-10-CM | POA: Diagnosis not present

## 2015-01-15 MED ORDER — METFORMIN HCL 1000 MG PO TABS: 1000.0000 mg | ORAL_TABLET | Freq: Two times a day (BID) | ORAL | Status: DC

## 2015-01-15 NOTE — Progress Notes (Signed)
Name: Charlotte Herrera   MRN: [id number]    DOB: 09/02/1945   Date:01/15/2015       Progress Note  Subjective  Chief Complaint  Chief Complaint  Patient presents with  . Referral    Patient wants to discuss referral  . Hyperlipidemia  . Diabetes    Hyperlipidemia This is a chronic problem. The problem is uncontrolled. Recent lipid tests were reviewed and are high. Exacerbating diseases include diabetes. She has no history of liver disease. Associated symptoms include leg pain and myalgias. Pertinent negatives include no chest pain. She is currently on no antihyperlipidemic treatment.  Diabetes She presents for her follow-up diabetic visit. She has type 2 diabetes mellitus. Her disease course has been stable. There are no hypoglycemic associated symptoms. Associated symptoms include foot paresthesias, polydipsia, polyuria and weakness. Pertinent negatives for diabetes include no chest pain. Symptoms are stable. Current diabetic treatment includes intensive insulin program and oral agent (monotherapy). Her weight is stable. She is following a diabetic and generally healthy diet. She has not had a previous visit with a dietitian. She participates in exercise three times a week. Her breakfast blood glucose is taken between 7-8 am. Her breakfast blood glucose range is generally 90-110 mg/dl.  Knee Pain  There was no injury mechanism. The pain is present in the left knee and right knee. The quality of the pain is described as aching. The pain is at a severity of 8/10. The pain is moderate. The pain has been constant since onset. Associated symptoms include a loss of motion, muscle weakness, numbness (numbensss and tingling in her feet.) and tingling. Pertinent negatives include no inability to bear weight. The symptoms are aggravated by movement. She has tried acetaminophen, ice and NSAIDs (Has taken Celebrex in the past.) for the symptoms.  Skin Lesion Multiple skin lesions (face, neck, legs and back),  present for many years. She thinks some of these lesions have changed in shape and color, some have even grown bigger. She has no history of skin cancer. She is requesting a referral to Skin Specialist.   Past Medical History  Diagnosis Date  . Enlarged liver   . Diabetes mellitus without complication   . Hypertension   . Hyperlipidemia   . GERD (gastroesophageal reflux disease)   . Thyroid disease   . Stroke   . Depression   . Fibromyalgia affecting multiple sites     Past Surgical History  Procedure Laterality Date  . Varicose vein surgery Left 1975  . Abdominal hysterectomy    . Spine surgery    . Carpal tunnel release Left     Family History  Problem Relation Age of Onset  . Cancer Mother   . Diabetes Father   . Hearing loss Father   . Cancer Father   . Diabetes Brother   . Cancer Brother     History   Social History  . Marital Status: Divorced    Spouse Name: N/A  . Number of Children: N/A  . Years of Education: N/A   Occupational History  . Not on file.   Social History Main Topics  . Smoking status: Never Smoker   . Smokeless tobacco: Never Used  . Alcohol Use: No  . Drug Use: No  . Sexual Activity: Not on file   Other Topics Concern  . Not on file   Social History Narrative     Current outpatient prescriptions:  .  acetaminophen (TYLENOL) 325 MG tablet, Take 650 mg by mouth  daily., Disp: , Rfl:  .  ALPRAZolam (XANAX) 0.5 MG tablet, Take 1 tablet (0.5 mg total) by mouth 2 (two) times daily as needed for anxiety., Disp: 60 tablet, Rfl: 2 .  cetirizine (ZYRTEC) 10 MG tablet, Take 10 mg by mouth daily., Disp: , Rfl:  .  clopidogrel (PLAVIX) 75 MG tablet, Take 1 tablet (75 mg total) by mouth daily., Disp: 90 tablet, Rfl: 1 .  ergocalciferol (VITAMIN D2) 50000 UNITS capsule, Take 50,000 Units by mouth once a week., Disp: , Rfl:  .  escitalopram (LEXAPRO) 10 MG tablet, Take 10 mg by mouth daily., Disp: , Rfl:  .  gabapentin (NEURONTIN) 300 MG  capsule, Take 300 mg by mouth. ONE OR TWO AT NIGHT, Disp: , Rfl:  .  glucose blood (BAYER CONTOUR NEXT TEST) test strip, Test blood sugar twice daily, Disp: 100 each, Rfl: 12 .  insulin glargine (LANTUS) 100 UNIT/ML injection, Inject 80 Units into the skin at bedtime., Disp: , Rfl:  .  Insulin Pen Needle 31G X 5 MM MISC, 1 each by Does not apply route at bedtime., Disp: 100 each, Rfl: 2 .  ketoconazole (NIZORAL) 2 % cream, Apply 1 application topically daily., Disp: 30 g, Rfl: 0 .  levothyroxine (SYNTHROID, LEVOTHROID) 50 MCG tablet, Take 50 mcg by mouth daily before breakfast., Disp: , Rfl:  .  losartan-hydrochlorothiazide (HYZAAR) 50-12.5 MG per tablet, Take 1 tablet by mouth daily., Disp: , Rfl:  .  metFORMIN (GLUCOPHAGE) 500 MG tablet, Take 1 tablet (500 mg total) by mouth 2 (two) times daily with a meal., Disp: 180 tablet, Rfl: 0 .  fluticasone (FLONASE) 50 MCG/ACT nasal spray, Place 2 sprays into the nose daily., Disp: , Rfl:   Allergies  Allergen Reactions  . Aspirin   . Codeine Nausea And Vomiting    RASH  . Duloxetine Hcl   . Lyrica [Pregabalin] Nausea And Vomiting  . Penicillins   . Sulfa Antibiotics Swelling     Review of Systems  Cardiovascular: Negative for chest pain.  Musculoskeletal: Positive for myalgias and back pain.  Skin: Negative for itching and rash.  Neurological: Positive for tingling, weakness and numbness (numbensss and tingling in her feet.).  Endo/Heme/Allergies: Positive for polydipsia.      Objective  Filed Vitals:   01/15/15 1023  BP: 118/70  Pulse: 92  Temp: 98 F (36.7 C)  TempSrc: Oral  Resp: 17  Height: 5\' 4"  (1.626 m)  Weight: 174 lb 12.8 oz (79.289 kg)  SpO2: 97%    Physical Exam  Constitutional: She is well-developed, well-nourished, and in no distress.  Musculoskeletal:       Right knee: She exhibits normal range of motion, no swelling and no erythema.       Left knee: She exhibits normal range of motion, no swelling, no  deformity and no erythema.  Skin: Skin is warm, dry and intact. No rash noted.  Multiple well-defined raised non-erythematous wart-like and flat lesions over her skin including neck, back, face, and legs.  Nursing note and vitals reviewed.    Assessment & Plan 1. Vitamin B12 deficiency Patient has been on vitamin B-12 injections in the past. Recheck levels today - B12  2. Type 2 diabetes mellitus with diabetic polyneuropathy  - metFORMIN (GLUCOPHAGE) 1000 MG tablet; Take 1 tablet (1,000 mg total) by mouth 2 (two) times daily with a meal.  Dispense: 180 tablet; Refill: 0  3. Vitamin D deficiency  - Vitamin D (25 hydroxy)  4. Dyslipidemia Recheck  liver enzymes and consider starting on a statin as appropriate.  5. Abnormal liver enzymes  - Comprehensive Metabolic Panel (CMET)  6. Bilateral chronic knee pain  - DG Knee Complete 4 Views Left; Future - DG Knee Complete 4 Views Right; Future  7. Skin lesions  - Ambulatory referral to Dermatology   Yelena Metzer [facility] 01/15/2015 10:51 AM

## 2015-01-16 LAB — COMPREHENSIVE METABOLIC PANEL
ALT: 60 IU/L — ABNORMAL HIGH (ref 0–32)
AST: 98 IU/L — ABNORMAL HIGH (ref 0–40)
Albumin/Globulin Ratio: 1.2 (ref 1.1–2.5)
Albumin: 4.6 g/dL (ref 3.6–4.8)
Alkaline Phosphatase: 52 IU/L (ref 39–117)
BUN/Creatinine Ratio: 17 (ref 11–26)
BUN: 20 mg/dL (ref 8–27)
Bilirubin Total: 0.3 mg/dL (ref 0.0–1.2)
CO2: 26 mmol/L (ref 18–29)
Calcium: 10.5 mg/dL — ABNORMAL HIGH (ref 8.7–10.3)
Chloride: 98 mmol/L (ref 97–108)
Creatinine, Ser: 1.17 mg/dL — ABNORMAL HIGH (ref 0.57–1.00)
GFR calc Af Amer: 55 mL/min/{1.73_m2} — ABNORMAL LOW (ref >59)
GFR calc non Af Amer: 48 mL/min/{1.73_m2} — ABNORMAL LOW (ref >59)
Globulin, Total: 3.7 g/dL (ref 1.5–4.5)
Glucose: 158 mg/dL — ABNORMAL HIGH (ref 65–99)
Potassium: 5.2 mmol/L (ref 3.5–5.2)
Sodium: 140 mmol/L (ref 134–144)
Total Protein: 8.3 g/dL (ref 6.0–8.5)

## 2015-01-16 LAB — VITAMIN D 25 HYDROXY (VIT D DEFICIENCY, FRACTURES): Vit D, 25-Hydroxy: 27.1 ng/mL — ABNORMAL LOW (ref 30.0–100.0)

## 2015-01-16 LAB — VITAMIN B12: Vitamin B-12: 673 pg/mL (ref 211–946)

## 2015-01-17 DIAGNOSIS — M7710 Lateral epicondylitis, unspecified elbow: Secondary | ICD-10-CM | POA: Insufficient documentation

## 2015-01-17 DIAGNOSIS — M7540 Impingement syndrome of unspecified shoulder: Secondary | ICD-10-CM | POA: Insufficient documentation

## 2015-01-23 ENCOUNTER — Telehealth: Payer: Self-pay | Admitting: Family Medicine

## 2015-01-23 NOTE — Telephone Encounter (Signed)
Spoke with patient and let her know per Dr. Manuella Ghazi to stop taking medication at this time and he is going to recheck her A1C levels patient verbalized understanding.

## 2015-01-23 NOTE — Telephone Encounter (Signed)
PT SAID THAT THE DR CHANGED HER DOSE OF THE  DIAB MEDS THE OTHER DAY AND IT IS BRINGING HER SUGAR DOWN WAY TO LOW. IT WAS 75 THIS MORNING AND SHE IS NOT FEELING GOOD

## 2015-02-12 ENCOUNTER — Telehealth: Payer: Self-pay | Admitting: Family Medicine

## 2015-02-12 MED ORDER — GLUCOSE BLOOD VI STRP: ORAL_STRIP | Status: DC

## 2015-02-12 MED ORDER — ONETOUCH DELICA LANCETS 33G MISC
33.0000 g | Freq: Two times a day (BID) | Status: DC
Start: 2015-02-12 — End: 2015-02-12

## 2015-02-12 MED ORDER — ONETOUCH DELICA LANCETS 33G MISC: 33.0000 g | Freq: Two times a day (BID) | Status: DC

## 2015-02-12 MED ORDER — ONETOUCH ULTRA MINI W/DEVICE KIT: 1.0000 | PACK | Freq: Two times a day (BID) | Status: DC

## 2015-02-12 NOTE — Telephone Encounter (Signed)
Medication was ordered and sent to wrong pharmacy [facility] , so medication has been reordered and sent to correct pharmacy [facility] [address] [facility]

## 2015-02-19 ENCOUNTER — Ambulatory Visit: Payer: PPO | Admitting: Family Medicine

## 2015-02-27 ENCOUNTER — Encounter: Payer: Self-pay | Admitting: Family Medicine

## 2015-02-27 ENCOUNTER — Ambulatory Visit (INDEPENDENT_AMBULATORY_CARE_PROVIDER_SITE_OTHER): Payer: PPO | Admitting: Family Medicine

## 2015-02-27 ENCOUNTER — Other Ambulatory Visit: Payer: Self-pay | Admitting: Family Medicine

## 2015-02-27 ENCOUNTER — Telehealth: Payer: Self-pay | Admitting: Family Medicine

## 2015-02-27 VITALS — BP 118/78 | HR 96 | Temp 97.9°F | Resp 18 | Ht 64.0 in | Wt 172.9 lb

## 2015-02-27 DIAGNOSIS — J0100 Acute maxillary sinusitis, unspecified: Secondary | ICD-10-CM | POA: Insufficient documentation

## 2015-02-27 DIAGNOSIS — Z8673 Personal history of transient ischemic attack (TIA), and cerebral infarction without residual deficits: Secondary | ICD-10-CM

## 2015-02-27 DIAGNOSIS — B372 Candidiasis of skin and nail: Secondary | ICD-10-CM

## 2015-02-27 DIAGNOSIS — F411 Generalized anxiety disorder: Secondary | ICD-10-CM

## 2015-02-27 MED ORDER — KETOCONAZOLE 2 % EX CREA: 1.0000 "application " | TOPICAL_CREAM | Freq: Every day | CUTANEOUS | Status: DC

## 2015-02-27 MED ORDER — ALPRAZOLAM 0.5 MG PO TABS: 0.5000 mg | ORAL_TABLET | Freq: Two times a day (BID) | ORAL | Status: DC | PRN

## 2015-02-27 MED ORDER — CLOPIDOGREL BISULFATE 75 MG PO TABS: 75.0000 mg | ORAL_TABLET | Freq: Every day | ORAL | Status: DC

## 2015-02-27 MED ORDER — AMOXICILLIN 500 MG PO CAPS: 500.0000 mg | ORAL_CAPSULE | Freq: Three times a day (TID) | ORAL | Status: DC

## 2015-02-27 MED ORDER — LOSARTAN POTASSIUM-HCTZ 50-12.5 MG PO TABS: 1.0000 | ORAL_TABLET | Freq: Every day | ORAL | Status: DC

## 2015-02-27 MED ORDER — CLONIDINE HCL 0.1 MG PO TABS: 0.1000 mg | ORAL_TABLET | Freq: Two times a day (BID) | ORAL | Status: DC

## 2015-02-27 NOTE — Telephone Encounter (Signed)
Medications have been refilled and sent to [facility]

## 2015-02-27 NOTE — Telephone Encounter (Signed)
Medication has been refilled and sent to [facility]

## 2015-02-27 NOTE — Progress Notes (Signed)
Name: Charlotte Herrera   MRN: [id number]    DOB: 02-27-1946   Date:02/27/2015       Progress Note  Subjective  Chief Complaint  Chief Complaint  Patient presents with  . Acute Visit    Possible Sinus Infection  . Diabetes  . Hyperlipidemia    Sinusitis This is a new problem. The current episode started 1 to 4 weeks ago (2 weeks ago). There has been no fever. Associated symptoms include sinus pressure and a sore throat. Pertinent negatives include no coughing, ear pain (pt. described left ear fullness), neck pain or shortness of breath. Past treatments include nothing.    Past Medical History  Diagnosis Date  . Enlarged liver   . Diabetes mellitus without complication   . Hypertension   . Hyperlipidemia   . GERD (gastroesophageal reflux disease)   . Thyroid disease   . Stroke   . Depression   . Fibromyalgia affecting multiple sites     Past Surgical History  Procedure Laterality Date  . Varicose vein surgery Left 1975  . Abdominal hysterectomy    . Spine surgery    . Carpal tunnel release Left     Family History  Problem Relation Age of Onset  . Cancer Mother   . Diabetes Father   . Hearing loss Father   . Cancer Father   . Diabetes Brother   . Cancer Brother     Social History   Social History  . Marital Status: Divorced    Spouse Name: N/A  . Number of Children: N/A  . Years of Education: N/A   Occupational History  . Not on file.   Social History Main Topics  . Smoking status: Never Smoker   . Smokeless tobacco: Never Used  . Alcohol Use: No  . Drug Use: No  . Sexual Activity: Not on file   Other Topics Concern  . Not on file   Social History Narrative     Current outpatient prescriptions:  .  acetaminophen (TYLENOL) 325 MG tablet, Take 650 mg by mouth daily., Disp: , Rfl:  .  Blood Glucose Monitoring Suppl (ONE TOUCH ULTRA MINI) W/DEVICE KIT, 1 Device by Does not apply route 2 (two) times daily., Disp: 1 each, Rfl: 0 .  cetirizine (ZYRTEC) 10  MG tablet, Take 10 mg by mouth daily., Disp: , Rfl:  .  DEXILANT 60 MG capsule, TK 1 C PO D, Disp: , Rfl: 11 .  ergocalciferol (VITAMIN D2) 50000 UNITS capsule, Take 50,000 Units by mouth once a week., Disp: , Rfl:  .  escitalopram (LEXAPRO) 10 MG tablet, Take 10 mg by mouth daily., Disp: , Rfl:  .  fluticasone (FLONASE) 50 MCG/ACT nasal spray, Place 2 sprays into the nose daily., Disp: , Rfl:  .  gabapentin (NEURONTIN) 300 MG capsule, Take 300 mg by mouth. ONE OR TWO AT NIGHT, Disp: , Rfl:  .  glucose blood test strip, Use as instructed, Disp: 100 each, Rfl: 12 .  insulin glargine (LANTUS) 100 UNIT/ML injection, Inject 80 Units into the skin at bedtime., Disp: , Rfl:  .  Insulin Pen Needle 31G X 5 MM MISC, 1 each by Does not apply route at bedtime., Disp: 100 each, Rfl: 2 .  ketoconazole (NIZORAL) 2 % cream, Apply 1 application topically daily., Disp: 30 g, Rfl: 0 .  LANTUS SOLOSTAR 100 UNIT/ML Solostar Pen, INJECT 80 UNITS SQ D, Disp: , Rfl: 5 .  levothyroxine (SYNTHROID, LEVOTHROID) 50 MCG tablet, Take 50  mcg by mouth daily before breakfast., Disp: , Rfl:  .  metFORMIN (GLUCOPHAGE) 1000 MG tablet, Take 1 tablet (1,000 mg total) by mouth 2 (two) times daily with a meal., Disp: 180 tablet, Rfl: 0 .  ONETOUCH DELICA LANCETS 70H MISC, 33 g by Does not apply route 2 (two) times daily. Test twice a day as directed, Disp: 100 each, Rfl: 2 .  ALPRAZolam (XANAX) 0.5 MG tablet, Take 1 tablet (0.5 mg total) by mouth 2 (two) times daily as needed for anxiety., Disp: 60 tablet, Rfl: 0 .  cloNIDine (CATAPRES) 0.1 MG tablet, Take 1 tablet (0.1 mg total) by mouth 2 (two) times daily., Disp: 60 tablet, Rfl: 2 .  clopidogrel (PLAVIX) 75 MG tablet, Take 1 tablet (75 mg total) by mouth daily., Disp: 90 tablet, Rfl: 1 .  losartan-hydrochlorothiazide (HYZAAR) 50-12.5 MG per tablet, Take 1 tablet by mouth daily., Disp: 90 tablet, Rfl: 1  Allergies  Allergen Reactions  . Aspirin   . Codeine Nausea And Vomiting     RASH  . Duloxetine Hcl   . Lyrica [Pregabalin] Nausea And Vomiting  . Penicillins   . Sulfa Antibiotics Swelling     Review of Systems  HENT: Positive for sinus pressure and sore throat. Negative for ear pain (pt. described left ear fullness).   Respiratory: Negative for cough and shortness of breath.   Musculoskeletal: Negative for neck pain.    Objective  Filed Vitals:   02/27/15 1117  BP: 118/78  Pulse: 96  Temp: 97.9 F (36.6 C)  TempSrc: Oral  Resp: 18  Height: 5' 4"  (1.626 m)  Weight: 172 lb 14.4 oz (78.427 kg)  SpO2: 97%    Physical Exam  Constitutional: She is well-developed, well-nourished, and in no distress.  HENT:  Right Ear: Tympanic membrane and ear canal normal.  Left Ear: Tympanic membrane and ear canal normal.  Nose: Right sinus exhibits no maxillary sinus tenderness and no frontal sinus tenderness. Left sinus exhibits maxillary sinus tenderness. Left sinus exhibits no frontal sinus tenderness.  Mouth/Throat: No posterior oropharyngeal erythema.  Cardiovascular: Normal rate and regular rhythm.   Pulmonary/Chest: Effort normal and breath sounds normal.  Nursing note and vitals reviewed.   Assessment & Plan  1. Acute maxillary sinusitis, recurrence not specified  - amoxicillin (AMOXIL) 500 MG capsule; Take 1 capsule (500 mg total) by mouth 3 (three) times daily.  Dispense: 30 capsule; Refill: 0   Reymundo Winship [facility] 02/27/2015 11:38 AM

## 2015-03-05 ENCOUNTER — Other Ambulatory Visit: Payer: Self-pay | Admitting: Family Medicine

## 2015-03-05 ENCOUNTER — Telehealth: Payer: Self-pay | Admitting: Family Medicine

## 2015-03-05 DIAGNOSIS — F411 Generalized anxiety disorder: Secondary | ICD-10-CM

## 2015-03-05 MED ORDER — ALPRAZOLAM 0.5 MG PO TABS: 0.5000 mg | ORAL_TABLET | Freq: Two times a day (BID) | ORAL | Status: DC | PRN

## 2015-03-05 NOTE — Telephone Encounter (Signed)
Alprazolam (Xanax) has been refilled and is ready for patient to pickup, this medication was refilled on 02/27/2015 at patient's office visit however patient stated she never received script, I did research and checked [place] controlled substance registry and the last dispense of this medication was on 11/22/2014 also I called [facility] and they also reported the same date of dispense as 11/22/2014, so prescription has been reprinted per Dr. Manuella Ghazi and patient will come into office to pick up script.

## 2015-03-12 ENCOUNTER — Telehealth: Payer: Self-pay | Admitting: Family Medicine

## 2015-03-12 MED ORDER — INSULIN GLARGINE 100 UNIT/ML ~~LOC~~ SOLN: 80.0000 [IU] | Freq: Every day | SUBCUTANEOUS | Status: DC

## 2015-03-12 MED ORDER — LEVOTHYROXINE SODIUM 50 MCG PO TABS: 50.0000 ug | ORAL_TABLET | Freq: Every day | ORAL | Status: DC

## 2015-03-12 NOTE — Telephone Encounter (Signed)
Medication has been refilled and sent to [facility] 

## 2015-04-04 ENCOUNTER — Telehealth: Payer: Self-pay | Admitting: Family Medicine

## 2015-04-04 ENCOUNTER — Ambulatory Visit (INDEPENDENT_AMBULATORY_CARE_PROVIDER_SITE_OTHER): Payer: PPO | Admitting: Family Medicine

## 2015-04-04 ENCOUNTER — Encounter: Payer: Self-pay | Admitting: Family Medicine

## 2015-04-04 VITALS — BP 120/70 | HR 97 | Temp 98.0°F | Resp 18 | Ht 64.0 in | Wt 176.8 lb

## 2015-04-04 DIAGNOSIS — E119 Type 2 diabetes mellitus without complications: Secondary | ICD-10-CM

## 2015-04-04 DIAGNOSIS — M79675 Pain in left toe(s): Secondary | ICD-10-CM

## 2015-04-04 DIAGNOSIS — M79672 Pain in left foot: Secondary | ICD-10-CM | POA: Diagnosis not present

## 2015-04-04 DIAGNOSIS — M7989 Other specified soft tissue disorders: Secondary | ICD-10-CM

## 2015-04-04 LAB — POCT GLYCOSYLATED HEMOGLOBIN (HGB A1C): Hemoglobin A1C: 7.9

## 2015-04-04 LAB — GLUCOSE, POCT (MANUAL RESULT ENTRY): POC Glucose: 156 mg/dl — AB (ref 70–99)

## 2015-04-04 NOTE — Progress Notes (Signed)
Name: Charlotte Herrera   MRN: [id number]    DOB: 1945-12-20   Date:04/04/2015       Progress Note  Subjective  Chief Complaint  Chief Complaint  Patient presents with  . Foot Pain    check left foot  . Diabetes  . Hyperlipidemia   Foot Pain This is a new problem. The current episode started yesterday (noticed swelling and redness at the base of left great toe). The problem has been gradually improving. Associated symptoms include joint swelling and nausea. Pertinent negatives include no chills or fever. The symptoms are aggravated by standing (pain in the left great toe area on walking ). She has tried ice for the symptoms.   Past Medical History  Diagnosis Date  . Enlarged liver   . Diabetes mellitus without complication ([place])   . Hypertension   . Hyperlipidemia   . GERD (gastroesophageal reflux disease)   . Thyroid disease   . Stroke ([place])   . Depression   . Fibromyalgia affecting multiple sites     Past Surgical History  Procedure Laterality Date  . Varicose vein surgery Left 1975  . Abdominal hysterectomy    . Spine surgery    . Carpal tunnel release Left     Family History  Problem Relation Age of Onset  . Cancer Mother   . Diabetes Father   . Hearing loss Father   . Cancer Father   . Diabetes Brother   . Cancer Brother     Social History   Social History  . Marital Status: Divorced    Spouse Name: N/A  . Number of Children: N/A  . Years of Education: N/A   Occupational History  . Not on file.   Social History Main Topics  . Smoking status: Never Smoker   . Smokeless tobacco: Never Used  . Alcohol Use: No  . Drug Use: No  . Sexual Activity: Not on file   Other Topics Concern  . Not on file   Social History Narrative     Current outpatient prescriptions:  .  ALPRAZolam (XANAX) 0.5 MG tablet, Take 1 tablet (0.5 mg total) by mouth 2 (two) times daily as needed for anxiety., Disp: 60 tablet, Rfl: 0 .  Blood Glucose Monitoring Suppl (ONE TOUCH ULTRA  MINI) W/DEVICE KIT, 1 Device by Does not apply route 2 (two) times daily., Disp: 1 each, Rfl: 0 .  cetirizine (ZYRTEC) 10 MG tablet, Take 10 mg by mouth daily., Disp: , Rfl:  .  cloNIDine (CATAPRES) 0.1 MG tablet, Take 1 tablet (0.1 mg total) by mouth 2 (two) times daily., Disp: 60 tablet, Rfl: 2 .  clopidogrel (PLAVIX) 75 MG tablet, Take 1 tablet (75 mg total) by mouth daily., Disp: 90 tablet, Rfl: 1 .  DEXILANT 60 MG capsule, TK 1 C PO D, Disp: , Rfl: 11 .  ergocalciferol (VITAMIN D2) 50000 UNITS capsule, Take 50,000 Units by mouth once a week., Disp: , Rfl:  .  escitalopram (LEXAPRO) 10 MG tablet, Take 10 mg by mouth daily., Disp: , Rfl:  .  fluticasone (FLONASE) 50 MCG/ACT nasal spray, Place 2 sprays into the nose daily., Disp: , Rfl:  .  gabapentin (NEURONTIN) 300 MG capsule, Take 300 mg by mouth. ONE OR TWO AT NIGHT, Disp: , Rfl:  .  glucose blood test strip, Use as instructed, Disp: 100 each, Rfl: 12 .  insulin glargine (LANTUS) 100 UNIT/ML injection, Inject 0.8 mLs (80 Units total) into the skin at bedtime., Disp:  10 mL, Rfl: 1 .  Insulin Pen Needle 31G X 5 MM MISC, 1 each by Does not apply route at bedtime., Disp: 100 each, Rfl: 2 .  ketoconazole (NIZORAL) 2 % cream, Apply 1 application topically daily., Disp: 30 g, Rfl: 0 .  LANTUS SOLOSTAR 100 UNIT/ML Solostar Pen, INJECT 80 UNITS SQ D, Disp: , Rfl: 5 .  levothyroxine (SYNTHROID, LEVOTHROID) 50 MCG tablet, Take 1 tablet (50 mcg total) by mouth daily before breakfast., Disp: 30 tablet, Rfl: 1 .  losartan-hydrochlorothiazide (HYZAAR) 50-12.5 MG per tablet, Take 1 tablet by mouth daily., Disp: 90 tablet, Rfl: 1 .  metFORMIN (GLUCOPHAGE) 1000 MG tablet, Take 1 tablet (1,000 mg total) by mouth 2 (two) times daily with a meal., Disp: 180 tablet, Rfl: 0 .  ONETOUCH DELICA LANCETS 18A MISC, 33 g by Does not apply route 2 (two) times daily. Test twice a day as directed, Disp: 100 each, Rfl: 2 .  acetaminophen (TYLENOL) 325 MG tablet, Take 650 mg  by mouth daily., Disp: , Rfl:  .  amoxicillin (AMOXIL) 500 MG capsule, Take 1 capsule (500 mg total) by mouth 3 (three) times daily. (Patient not taking: Reported on 04/04/2015), Disp: 30 capsule, Rfl: 0  Allergies  Allergen Reactions  . Aspirin   . Codeine Nausea And Vomiting    RASH  . Duloxetine Hcl   . Lyrica [Pregabalin] Nausea And Vomiting  . Penicillins   . Sulfa Antibiotics Swelling     Review of Systems  Constitutional: Negative for fever and chills.  Gastrointestinal: Positive for nausea.  Musculoskeletal: Positive for joint swelling.      Objective  Filed Vitals:   04/04/15 1125  BP: 120/70  Pulse: 97  Temp: 98 F (36.7 C)  TempSrc: Oral  Resp: 18  Height: _0  (1.626 m)  Weight: 176 lb 12.8 oz (80.196 kg)  SpO2: 97%   Physical Exam  Musculoskeletal:       Left foot: There is no tenderness, no bony tenderness, no swelling, no crepitus and no deformity.  Erythematous, mildly swollen area at the base of left great toe, good ROM of toe, no tenderness on palpation.  Nursing note and vitals reviewed.   Recent Results (from the past 2160 hour(s))  Comprehensive Metabolic Panel (CMET)     Status: Abnormal   Collection Time: 01/15/15 11:40 AM  Result Value Ref Range   Glucose 158 (H) 65 - 99 mg/dL   BUN 20 8 - 27 mg/dL   Creatinine, Ser 1.17 (H) 0.57 - 1.00 mg/dL   GFR calc non Af Amer 48 (L) >59 mL/min/1.73   GFR calc Af Amer 55 (L) >59 mL/min/1.73   BUN/Creatinine Ratio 17 11 - 26   Sodium 140 134 - 144 mmol/L   Potassium 5.2 3.5 - 5.2 mmol/L   Chloride 98 97 - 108 mmol/L   CO2 26 18 - 29 mmol/L   Calcium 10.5 (H) 8.7 - 10.3 mg/dL   Total Protein 8.3 6.0 - 8.5 g/dL   Albumin 4.6 3.6 - 4.8 g/dL   Globulin, Total 3.7 1.5 - 4.5 g/dL   Albumin/Globulin Ratio 1.2 1.1 - 2.5   Bilirubin Total 0.3 0.0 - 1.2 mg/dL   Alkaline Phosphatase 52 39 - 117 IU/L   AST 98 (H) 0 - 40 IU/L   ALT 60 (H) 0 - 32 IU/L  Vitamin D (25 hydroxy)     Status: Abnormal    Collection Time: 01/15/15 11:40 AM  Result Value Ref Range  Vit D, 25-Hydroxy 27.1 (L) 30.0 - 100.0 ng/mL    Comment: Vitamin D deficiency has been defined by the [place] practice guideline as a level of serum 25-OH vitamin D less than 20 ng/mL (1,2). The Endocrine Society went on to further define vitamin D insufficiency as a level between 21 and 29 ng/mL (2). 1. IOM (Institute of Medicine). 2010. Dietary reference    intakes for calcium and D. [place]: The    [employer]. 2. Holick MF, Binkley [place], Bischoff-Ferrari HA, et al.    Evaluation, treatment, and prevention of vitamin D    deficiency: an Endocrine Society clinical practice    guideline. JCEM. 2011 Jul; 96(7):1911-30.   B12     Status: None   Collection Time: 01/15/15 11:40 AM  Result Value Ref Range   Vitamin B-12 673 211 - 946 pg/mL  POCT Glucose (CBG)     Status: Abnormal   Collection Time: 04/04/15 11:38 AM  Result Value Ref Range   POC Glucose 156 (A) 70 - 99 mg/dl  POCT HgB A1C     Status: Normal   Collection Time: 04/04/15 11:44 AM  Result Value Ref Range   Hemoglobin A1C 7.9    Assessment & Plan  1. Type 2 diabetes mellitus without complication, unspecified [place] term insulin use status (HCC)  - POCT Glucose (CBG) - POCT HgB A1C  2. Pain and swelling of toe of left foot Rule out infection, versus inflammation, versus crystal deposition (gout),versus underlying skeletal trauma with x-ray. We will hold off on antibiotic therapy at this time. She will follow up if symptoms not improving. - CBC with Differential - Comprehensive Metabolic Panel (CMET) - Uric acid - Sed Rate (ESR) - DG Foot Complete Left; Future    Dempsey Knotek [facility] 04/04/2015 11:53 AM

## 2015-04-04 NOTE — Telephone Encounter (Signed)
Pts daughter called with concerns about her mom. She woke up this morning with a spot on her foot that is painful. She has DM and is worried about infection. No appt available today or tomorrow. Pt was advised to go to ER or EC and does not want to go. Please advise.

## 2015-04-05 ENCOUNTER — Telehealth: Payer: Self-pay | Admitting: *Deleted

## 2015-04-05 LAB — CBC WITH DIFFERENTIAL/PLATELET
Basophils Absolute: 0.1 10*3/uL (ref 0.0–0.2)
Basos: 1 %
EOS (ABSOLUTE): 0.3 10*3/uL (ref 0.0–0.4)
Eos: 3 %
Hematocrit: 43.3 % (ref 34.0–46.6)
Hemoglobin: 14.5 g/dL (ref 11.1–15.9)
Immature Grans (Abs): 0 10*3/uL (ref 0.0–0.1)
Immature Granulocytes: 0 %
Lymphocytes Absolute: 4.6 10*3/uL — ABNORMAL HIGH (ref 0.7–3.1)
Lymphs: 51 %
MCH: 30.2 pg (ref 26.6–33.0)
MCHC: 33.5 g/dL (ref 31.5–35.7)
MCV: 90 fL (ref 79–97)
Monocytes Absolute: 0.6 10*3/uL (ref 0.1–0.9)
Monocytes: 7 %
Neutrophils Absolute: 3.4 10*3/uL (ref 1.4–7.0)
Neutrophils: 38 %
Platelets: 252 10*3/uL (ref 150–379)
RBC: 4.8 x10E6/uL (ref 3.77–5.28)
RDW: 13.5 % (ref 12.3–15.4)
WBC: 9 10*3/uL (ref 3.4–10.8)

## 2015-04-05 LAB — SEDIMENTATION RATE: Sed Rate: 8 mm/hr (ref 0–40)

## 2015-04-05 LAB — COMPREHENSIVE METABOLIC PANEL
ALT: 62 IU/L — ABNORMAL HIGH (ref 0–32)
AST: 79 IU/L — ABNORMAL HIGH (ref 0–40)
Albumin/Globulin Ratio: 1.3 (ref 1.1–2.5)
Albumin: 4.5 g/dL (ref 3.6–4.8)
Alkaline Phosphatase: 63 IU/L (ref 39–117)
BUN/Creatinine Ratio: 17 (ref 11–26)
BUN: 19 mg/dL (ref 8–27)
Bilirubin Total: 0.4 mg/dL (ref 0.0–1.2)
CO2: 25 mmol/L (ref 18–29)
Calcium: 11.2 mg/dL — ABNORMAL HIGH (ref 8.7–10.3)
Chloride: 100 mmol/L (ref 97–108)
Creatinine, Ser: 1.12 mg/dL — ABNORMAL HIGH (ref 0.57–1.00)
GFR calc Af Amer: 58 mL/min/{1.73_m2} — ABNORMAL LOW (ref >59)
GFR calc non Af Amer: 50 mL/min/{1.73_m2} — ABNORMAL LOW (ref >59)
Globulin, Total: 3.4 g/dL (ref 1.5–4.5)
Glucose: 170 mg/dL — ABNORMAL HIGH (ref 65–99)
Potassium: 5.9 mmol/L — ABNORMAL HIGH (ref 3.5–5.2)
Sodium: 143 mmol/L (ref 134–144)
Total Protein: 7.9 g/dL (ref 6.0–8.5)

## 2015-04-05 LAB — URIC ACID: Uric Acid: 9.5 mg/dL — ABNORMAL HIGH (ref 2.5–7.1)

## 2015-04-05 NOTE — Telephone Encounter (Signed)
Pt states woke yesterday with blood red toe, family doctor said they didn't see anything wrong, this morning the toe feels like an ingrown.  Called phone busy.  I gave pt's contact information to scheduler - Dawn to get pt in today for treatment by Dr. Paulla Dolly.

## 2015-04-05 NOTE — Telephone Encounter (Signed)
Left vm on both numbers for pt to call to schedule an appt

## 2015-04-08 ENCOUNTER — Telehealth: Payer: Self-pay | Admitting: Family Medicine

## 2015-04-08 NOTE — Telephone Encounter (Signed)
Requesting return call to get lab results.

## 2015-04-09 ENCOUNTER — Ambulatory Visit (INDEPENDENT_AMBULATORY_CARE_PROVIDER_SITE_OTHER): Payer: PPO | Admitting: Sports Medicine

## 2015-04-09 ENCOUNTER — Ambulatory Visit: Payer: PPO | Admitting: Family Medicine

## 2015-04-09 ENCOUNTER — Encounter: Payer: Self-pay | Admitting: Sports Medicine

## 2015-04-09 VITALS — BP 136/80 | HR 77 | Resp 16

## 2015-04-09 DIAGNOSIS — B351 Tinea unguium: Secondary | ICD-10-CM | POA: Diagnosis not present

## 2015-04-09 DIAGNOSIS — M79673 Pain in unspecified foot: Secondary | ICD-10-CM | POA: Diagnosis not present

## 2015-04-09 DIAGNOSIS — M722 Plantar fascial fibromatosis: Secondary | ICD-10-CM

## 2015-04-09 DIAGNOSIS — E119 Type 2 diabetes mellitus without complications: Secondary | ICD-10-CM

## 2015-04-09 DIAGNOSIS — M79605 Pain in left leg: Secondary | ICD-10-CM

## 2015-04-09 DIAGNOSIS — M79604 Pain in right leg: Secondary | ICD-10-CM

## 2015-04-09 NOTE — Progress Notes (Signed)
Patient ID: Charlotte Herrera, female   DOB: 1945-11-03, 69 y.o.   MRN: [id number]  Subjective: Charlotte Herrera is a 69 y.o. female patient with history of type  2 diabetes who presents to office today complaining of Charlotte Herrera, painful nails  while ambulating in shoes unable to trim herself. Patient states that the glucose reading this morning was 172 mg/dl. Patient admits that on left heel having increased pain since a few weeks ago; hx of plantar fascial release; pain with first steps in morning in heel and arch. Patient states that sneakers and ice has helped. Patient also admits to 1 episode of left big toe turning red and painful of which she went to her PCP for; states that lab work was done however now symptoms have resolved; got better with icing; probable gout that is no longer symptomatic. Patient denies any new changes in medication or new problems. Patient denies any new cramping, numbness, burning or tingling in the legs.  Patient Active Problem List   Diagnosis Date Noted  . Pain and swelling of toe of left foot 04/04/2015  . Acute maxillary sinusitis 02/27/2015  . Bilateral chronic knee pain 01/15/2015  . Vitamin B12 deficiency 12/25/2014  . Vitamin D deficiency 12/25/2014  . Type 2 diabetes mellitus with neurologic complication ([place]) 37/48/2707  . Dyslipidemia 12/25/2014  . Anxiety disorder 12/25/2014  . Hx of transient ischemic attack (TIA) 12/25/2014  . Type 2 diabetes mellitus treated with insulin ([place]) 12/25/2014  . Abnormal liver enzymes 12/25/2014  . H/O: depression 12/25/2014   Current Outpatient Prescriptions on File Prior to Visit  Medication Sig Dispense Refill  . acetaminophen (TYLENOL) 325 MG tablet Take 650 mg by mouth daily.    [name] Kitchen ALPRAZolam (XANAX) 0.5 MG tablet Take 1 tablet (0.5 mg total) by mouth 2 (two) times daily as needed for anxiety. 60 tablet 0  . amoxicillin (AMOXIL) 500 MG capsule Take 1 capsule (500 mg total) by mouth 3 (three) times daily. (Patient not taking:  Reported on 04/04/2015) 30 capsule 0  . Blood Glucose Monitoring Suppl (ONE TOUCH ULTRA MINI) W/DEVICE KIT 1 Device by Does not apply route 2 (two) times daily. 1 each 0  . cetirizine (ZYRTEC) 10 MG tablet Take 10 mg by mouth daily.    . cloNIDine (CATAPRES) 0.1 MG tablet Take 1 tablet (0.1 mg total) by mouth 2 (two) times daily. 60 tablet 2  . clopidogrel (PLAVIX) 75 MG tablet Take 1 tablet (75 mg total) by mouth daily. 90 tablet 1  . DEXILANT 60 MG capsule TK 1 C PO D  11  . ergocalciferol (VITAMIN D2) 50000 UNITS capsule Take 50,000 Units by mouth once a week.    . escitalopram (LEXAPRO) 10 MG tablet Take 10 mg by mouth daily.    . fluticasone (FLONASE) 50 MCG/ACT nasal spray Place 2 sprays into the nose daily.    [name] Kitchen gabapentin (NEURONTIN) 300 MG capsule Take 300 mg by mouth. ONE OR TWO AT NIGHT    . glucose blood test strip Use as instructed 100 each 12  . insulin glargine (LANTUS) 100 UNIT/ML injection Inject 0.8 mLs (80 Units total) into the skin at bedtime. 10 mL 1  . Insulin Pen Needle 31G X 5 MM MISC 1 each by Does not apply route at bedtime. 100 each 2  . ketoconazole (NIZORAL) 2 % cream Apply 1 application topically daily. 30 g 0  . LANTUS SOLOSTAR 100 UNIT/ML Solostar Pen INJECT 80 UNITS SQ D  5  .  levothyroxine (SYNTHROID, LEVOTHROID) 50 MCG tablet Take 1 tablet (50 mcg total) by mouth daily before breakfast. 30 tablet 1  . losartan-hydrochlorothiazide (HYZAAR) 50-12.5 MG per tablet Take 1 tablet by mouth daily. 90 tablet 1  . metFORMIN (GLUCOPHAGE) 1000 MG tablet Take 1 tablet (1,000 mg total) by mouth 2 (two) times daily with a meal. 180 tablet 0  . ONETOUCH DELICA LANCETS 28J MISC 33 g by Does not apply route 2 (two) times daily. Test twice a day as directed 100 each 2   No current facility-administered medications on file prior to visit.   Allergies  Allergen Reactions  . Aspirin   . Codeine Nausea And Vomiting    RASH  . Duloxetine Hcl   . Lyrica [Pregabalin] Nausea And  Vomiting  . Penicillins   . Sulfa Antibiotics Swelling    Labs:HEMOGLOBIN A1C- lab ordered 04/04/15 but not yet performed.   Objective: General: Patient is awake, alert, and oriented x 3 and in no acute distress.  Integument: Skin is warm, dry and supple bilateral. Nails are tender, Charlotte Herrera, thickened and  dystrophic with subungual debris, consistent with onychomycosis, 1-5 bilateral. No signs of infection. No open lesions or preulcerative lesions present bilateral. Remaining integument unremarkable.  Vasculature:  Dorsalis Pedis pulse 2/4 bilateral. Posterior Tibial pulse  2/4 bilateral.  Capillary fill time <3 sec 1-5 bilateral. Scant hair growth to the level of the digits. Temperature gradient within normal limits. No varicosities present bilateral. No edema present bilateral.   Neurology: The patient has intact sensation measured with a 5.07/10g Semmes Weinstein Monofilament at all pedal sites bilateral . Vibratory sensation diminished bilateral with tuning fork. No Babinski sign present bilateral.   Musculoskeletal: No gross pedal deformities noted bilateral. Mild tenderness to palpation left heel at medial calcaneal tubercale. Muscular strength 5/5 in all lower extremity muscular groups bilateral without pain or limitation on range of motion . No tenderness with calf compression bilateral.  Assessment and Plan: Problem List Items Addressed This Visit    None    Visit Diagnoses    Dermatophytosis of nail    -  Primary    Pain in both lower extremities        Plantar fasciitis of left foot        Diabetes mellitus without complication ([place])          -Examined patient. -Discussed and educated patient on diabetic foot care, especially with  regards to the vascular, neurological and musculoskeletal systems.  -Stressed the importance of good glycemic control and the detriment of not  controlling glucose levels in relation to the foot. -Mechanically debrided all nails 1-5 bilateral  using sterile nail nipper and filed with dremel without incident  -Rx plantar fascial brace for left. Re-educated patient on stretching, good supportive shoes, icing and inserts. Will consider new xray and steroid injection at next encounter if symptoms persist.  -Answered all patient questions -Patient to return as needed or in 3 months for diabetic foot care or sooner if symptoms worsen.  -Patient advised to call the office if any problems or questions arise in the  Meantime.  Landis Martins, DPM

## 2015-04-09 NOTE — Patient Instructions (Signed)

## 2015-04-10 NOTE — Telephone Encounter (Signed)
Results have been reported to patient on 04/09/2015

## 2015-04-16 ENCOUNTER — Encounter: Payer: Self-pay | Admitting: Family Medicine

## 2015-04-16 ENCOUNTER — Ambulatory Visit (INDEPENDENT_AMBULATORY_CARE_PROVIDER_SITE_OTHER): Payer: PPO | Admitting: Family Medicine

## 2015-04-16 VITALS — BP 122/72 | HR 91 | Temp 98.1°F | Resp 18 | Ht 64.0 in | Wt 176.9 lb

## 2015-04-16 DIAGNOSIS — Z23 Encounter for immunization: Secondary | ICD-10-CM | POA: Diagnosis not present

## 2015-04-16 DIAGNOSIS — B373 Candidiasis of vulva and vagina: Secondary | ICD-10-CM | POA: Diagnosis not present

## 2015-04-16 DIAGNOSIS — E119 Type 2 diabetes mellitus without complications: Secondary | ICD-10-CM

## 2015-04-16 DIAGNOSIS — R399 Unspecified symptoms and signs involving the genitourinary system: Secondary | ICD-10-CM | POA: Diagnosis not present

## 2015-04-16 DIAGNOSIS — Z794 Long term (current) use of insulin: Secondary | ICD-10-CM

## 2015-04-16 DIAGNOSIS — B3731 Acute candidiasis of vulva and vagina: Secondary | ICD-10-CM

## 2015-04-16 DIAGNOSIS — F411 Generalized anxiety disorder: Secondary | ICD-10-CM

## 2015-04-16 LAB — POCT UA - MICROALBUMIN: Microalbumin Ur, POC: 20 mg/L

## 2015-04-16 LAB — POCT GLYCOSYLATED HEMOGLOBIN (HGB A1C): Hemoglobin A1C: 8

## 2015-04-16 LAB — POCT URINALYSIS DIPSTICK
Blood, UA: NEGATIVE
Glucose, UA: NEGATIVE
Ketones, UA: NEGATIVE
Nitrite, UA: NEGATIVE
Protein, UA: NEGATIVE
Spec Grav, UA: 1.025
Urobilinogen, UA: 0.2
pH, UA: 5

## 2015-04-16 LAB — GLUCOSE, POCT (MANUAL RESULT ENTRY): POC Glucose: 165 mg/dl — AB (ref 70–99)

## 2015-04-16 MED ORDER — LINAGLIPTIN 5 MG PO TABS: 5.0000 mg | ORAL_TABLET | Freq: Every day | ORAL | Status: DC

## 2015-04-16 MED ORDER — LANTUS SOLOSTAR 100 UNIT/ML ~~LOC~~ SOPN: 80.0000 [IU] | PEN_INJECTOR | Freq: Every day | SUBCUTANEOUS | Status: DC

## 2015-04-16 MED ORDER — FLUCONAZOLE 150 MG PO TABS: 150.0000 mg | ORAL_TABLET | Freq: Once | ORAL | Status: DC

## 2015-04-16 MED ORDER — ALPRAZOLAM 0.5 MG PO TABS: 0.5000 mg | ORAL_TABLET | Freq: Two times a day (BID) | ORAL | Status: DC | PRN

## 2015-04-16 MED ORDER — METFORMIN HCL ER (OSM) 500 MG PO TB24: 500.0000 mg | ORAL_TABLET | Freq: Two times a day (BID) | ORAL | Status: DC

## 2015-04-16 NOTE — Progress Notes (Signed)
Name: Charlotte Herrera   MRN: [id number]    DOB: 1946-04-30   Date:04/16/2015       Progress Note  Subjective  Chief Complaint  Chief Complaint  Patient presents with  . Diabetes    patient is here for her 63-monthfollow-up  . Urinary Tract Infection    patient presents with some pain upon urination. She has also had frequent urination.  . Medication Refill    patient is requesting a 365-monthupply of the Lantus Solostar, dexilant (6058m vitamin D, xanax (0.5) & metformin 500m82m Diabetes She presents for her follow-up diabetic visit. She has type 2 diabetes mellitus. Her disease course has been stable. Current diabetic treatment includes intensive insulin program and oral agent (monotherapy). She is following a diabetic diet. Her breakfast blood glucose range is generally 110-130 mg/dl. An ACE inhibitor/angiotensin II receptor blocker is being taken.  Urinary Tract Infection  This is a new problem. The current episode started in the past 7 days. There has been no fever. Associated symptoms include frequency and urgency. Pertinent negatives include no chills, discharge, hematuria or nausea.   Past Medical History  Diagnosis Date  . Enlarged liver   . Diabetes mellitus without complication (HCC)[place]. Hypertension   . Hyperlipidemia   . GERD (gastroesophageal reflux disease)   . Thyroid disease   . Stroke (HCC)[place]. Depression   . Fibromyalgia affecting multiple sites     Past Surgical History  Procedure Laterality Date  . Varicose vein surgery Left 1975  . Abdominal hysterectomy    . Spine surgery    . Carpal tunnel release Left     Family History  Problem Relation Age of Onset  . Cancer Mother   . Diabetes Father   . Hearing loss Father   . Cancer Father   . Diabetes Brother   . Cancer Brother     Social History   Social History  . Marital Status: Divorced    Spouse Name: N/A  . Number of Children: N/A  . Years of Education: N/A   Occupational History  . Not on  file.   Social History Main Topics  . Smoking status: Never Smoker   . Smokeless tobacco: Never Used  . Alcohol Use: No  . Drug Use: No  . Sexual Activity: Not on file   Other Topics Concern  . Not on file   Social History Narrative    Current outpatient prescriptions:  .  acetaminophen (TYLENOL) 325 MG tablet, Take 650 mg by mouth daily., Disp: , Rfl:  .  ALPRAZolam (XANAX) 0.5 MG tablet, Take 1 tablet (0.5 mg total) by mouth 2 (two) times daily as needed for anxiety., Disp: 60 tablet, Rfl: 0 .  amoxicillin (AMOXIL) 500 MG capsule, Take 1 capsule (500 mg total) by mouth 3 (three) times daily., Disp: 30 capsule, Rfl: 0 .  Blood Glucose Monitoring Suppl (ONE TOUCH ULTRA MINI) W/DEVICE KIT, 1 Device by Does not apply route 2 (two) times daily., Disp: 1 each, Rfl: 0 .  cetirizine (ZYRTEC) 10 MG tablet, Take 10 mg by mouth daily., Disp: , Rfl:  .  cloNIDine (CATAPRES) 0.1 MG tablet, Take 1 tablet (0.1 mg total) by mouth 2 (two) times daily., Disp: 60 tablet, Rfl: 2 .  clopidogrel (PLAVIX) 75 MG tablet, Take 1 tablet (75 mg total) by mouth daily., Disp: 90 tablet, Rfl: 1 .  DEXILANT 60 MG capsule, TK 1 C PO D, Disp: ,  Rfl: 11 .  ergocalciferol (VITAMIN D2) 50000 UNITS capsule, Take 50,000 Units by mouth once a week., Disp: , Rfl:  .  escitalopram (LEXAPRO) 10 MG tablet, Take 10 mg by mouth daily., Disp: , Rfl:  .  fluticasone (FLONASE) 50 MCG/ACT nasal spray, Place 2 sprays into the nose daily., Disp: , Rfl:  .  gabapentin (NEURONTIN) 300 MG capsule, Take 300 mg by mouth. ONE OR TWO AT NIGHT, Disp: , Rfl:  .  insulin glargine (LANTUS) 100 UNIT/ML injection, Inject 0.8 mLs (80 Units total) into the skin at bedtime., Disp: 10 mL, Rfl: 1 .  Insulin Pen Needle 31G X 5 MM MISC, 1 each by Does not apply route at bedtime., Disp: 100 each, Rfl: 2 .  ketoconazole (NIZORAL) 2 % cream, Apply 1 application topically daily., Disp: 30 g, Rfl: 0 .  LANTUS SOLOSTAR 100 UNIT/ML Solostar Pen, INJECT 80 UNITS  SQ D, Disp: , Rfl: 5 .  levothyroxine (SYNTHROID, LEVOTHROID) 50 MCG tablet, Take 1 tablet (50 mcg total) by mouth daily before breakfast., Disp: 30 tablet, Rfl: 1 .  losartan-hydrochlorothiazide (HYZAAR) 50-12.5 MG per tablet, Take 1 tablet by mouth daily., Disp: 90 tablet, Rfl: 1 .  metFORMIN (GLUCOPHAGE) 1000 MG tablet, Take 1 tablet (1,000 mg total) by mouth 2 (two) times daily with a meal., Disp: 180 tablet, Rfl: 0 .  ONETOUCH DELICA LANCETS 58I MISC, 33 g by Does not apply route 2 (two) times daily. Test twice a day as directed, Disp: 100 each, Rfl: 2 .  glucose blood test strip, Use as instructed, Disp: 100 each, Rfl: 12  Allergies  Allergen Reactions  . Aspirin   . Codeine Nausea And Vomiting    RASH  . Duloxetine Hcl   . Lyrica [Pregabalin] Nausea And Vomiting  . Penicillins   . Sulfa Antibiotics Swelling   Review of Systems  Constitutional: Negative for chills.  Gastrointestinal: Negative for nausea.  Genitourinary: Positive for urgency and frequency. Negative for hematuria.    Objective  Filed Vitals:   04/16/15 1059  BP: 122/72  Pulse: 91  Temp: 98.1 F (36.7 C)  TempSrc: Oral  Resp: 18  Height: 5' 4"  (1.626 m)  Weight: 176 lb 14.4 oz (80.241 kg)  SpO2: 96%    Physical Exam  Constitutional: She is oriented to person, place, and time and well-developed, well-nourished, and in no distress.  HENT:  Head: Normocephalic and atraumatic.  Cardiovascular: Normal rate, regular rhythm and normal heart sounds.   No murmur heard. Pulmonary/Chest: Effort normal and breath sounds normal. No respiratory distress. She has no wheezes. She has no rales.  Neurological: She is alert and oriented to person, place, and time.  Nursing note and vitals reviewed.  Assessment & Plan  1. Type 2 diabetes mellitus treated with insulin (HCC) A1c of 8.0%, consistent with poorly controlled diabetes. No apparent change in dietary and lifestyle therapy. We will decrease metformin to 500  mg twice a day because of  As side effects with metformin 100 mg. Add Tradjenta 5 mg daily. Re- check in 3 months.  - POCT glycosylated hemoglobin (Hb A1C) - POCT UA - Microalbumin - POCT glucose (manual entry) - metformin (FORTAMET) 500 MG (OSM) 24 hr tablet; Take 1 tablet (500 mg total) by mouth 2 (two) times daily with a meal.  Dispense: 180 tablet; Refill: 0 - linagliptin (TRADJENTA) 5 MG TABS tablet; Take 1 tablet (5 mg total) by mouth daily.  Dispense: 30 tablet; Refill: 2 - LANTUS SOLOSTAR 100  UNIT/ML Solostar Pen; Inject 80 Units into the skin daily at 10 pm.  Dispense: 45 mL; Refill: 0  2. Urinary tract infection symptoms We will obtain urinalysis and culture to assess. - POCT urinalysis dipstick - Urine Culture - Urinalysis, Routine w reflex microscopic  3. Encounter for immunization   4. Vaginal yeast infection  - fluconazole (DIFLUCAN) 150 MG tablet; Take 1 tablet (150 mg total) by mouth once.  Dispense: 1 tablet; Refill: 0  5. Generalized anxiety disorder  - ALPRAZolam (XANAX) 0.5 MG tablet; Take 1 tablet (0.5 mg total) by mouth 2 (two) times daily as needed for anxiety.  Dispense: 60 tablet; Refill: 0    Skyy Mcknight [facility] 04/16/2015 11:47 AM

## 2015-04-23 ENCOUNTER — Encounter: Payer: Self-pay | Admitting: Sports Medicine

## 2015-04-23 ENCOUNTER — Ambulatory Visit (INDEPENDENT_AMBULATORY_CARE_PROVIDER_SITE_OTHER): Payer: PPO | Admitting: Sports Medicine

## 2015-04-23 ENCOUNTER — Ambulatory Visit: Payer: Self-pay

## 2015-04-23 DIAGNOSIS — E119 Type 2 diabetes mellitus without complications: Secondary | ICD-10-CM

## 2015-04-23 DIAGNOSIS — M79672 Pain in left foot: Secondary | ICD-10-CM

## 2015-04-23 DIAGNOSIS — M722 Plantar fascial fibromatosis: Secondary | ICD-10-CM

## 2015-04-23 DIAGNOSIS — M109 Gout, unspecified: Secondary | ICD-10-CM

## 2015-04-23 MED ORDER — TRIAMCINOLONE ACETONIDE 10 MG/ML IJ SUSP: 10.0000 mg | Freq: Once | INTRAMUSCULAR | Status: DC

## 2015-04-23 MED ORDER — COLCHICINE 0.6 MG PO TABS: 0.6000 mg | ORAL_TABLET | Freq: Every day | ORAL | Status: DC

## 2015-04-23 NOTE — Progress Notes (Signed)
Patient ID: JAHDAI PADOVANO, female   DOB: 1946-01-16, 69 y.o.   MRN: [id number] Subjective: GERARDINE PELTZ is a 69 y.o. female type 2 diabetic patient return for increased heel pain on the left. Patient reports that she has done a lot since last seen 2 weeks ago that could of aggravated her heel. Patient states that the fascial brace helped at first but now her heel is very sensitive; states that she started using her CAM walker with some relief however because its heavy causes back pain which requires her to limb and use cane. Patient denies any other pedal complaints.  Patient Active Problem List   Diagnosis Date Noted  . Pain and swelling of toe of left foot 04/04/2015  . Acute maxillary sinusitis 02/27/2015  . Bilateral chronic knee pain 01/15/2015  . Vitamin B12 deficiency 12/25/2014  . Vitamin D deficiency 12/25/2014  . Type 2 diabetes mellitus with neurologic complication ([place]) 03/21/3006  . Dyslipidemia 12/25/2014  . Anxiety disorder 12/25/2014  . Hx of transient ischemic attack (TIA) 12/25/2014  . Type 2 diabetes mellitus treated with insulin ([place]) 12/25/2014  . Abnormal liver enzymes 12/25/2014  . H/O: depression 12/25/2014   Current Outpatient Prescriptions on File Prior to Visit  Medication Sig Dispense Refill  . acetaminophen (TYLENOL) 325 MG tablet Take 650 mg by mouth daily.    [name] Kitchen ALPRAZolam (XANAX) 0.5 MG tablet Take 1 tablet (0.5 mg total) by mouth 2 (two) times daily as needed for anxiety. 60 tablet 0  . Blood Glucose Monitoring Suppl (ONE TOUCH ULTRA MINI) W/DEVICE KIT 1 Device by Does not apply route 2 (two) times daily. 1 each 0  . cetirizine (ZYRTEC) 10 MG tablet Take 10 mg by mouth daily.    . cloNIDine (CATAPRES) 0.1 MG tablet Take 1 tablet (0.1 mg total) by mouth 2 (two) times daily. 60 tablet 2  . clopidogrel (PLAVIX) 75 MG tablet Take 1 tablet (75 mg total) by mouth daily. 90 tablet 1  . DEXILANT 60 MG capsule TK 1 C PO D  11  . ergocalciferol (VITAMIN D2) 50000 UNITS  capsule Take 50,000 Units by mouth once a week.    . escitalopram (LEXAPRO) 10 MG tablet Take 10 mg by mouth daily.    . fluconazole (DIFLUCAN) 150 MG tablet Take 1 tablet (150 mg total) by mouth once. 1 tablet 0  . fluticasone (FLONASE) 50 MCG/ACT nasal spray Place 2 sprays into the nose daily.    [name] Kitchen gabapentin (NEURONTIN) 300 MG capsule Take 300 mg by mouth. ONE OR TWO AT NIGHT    . glucose blood test strip Use as instructed 100 each 12  . insulin glargine (LANTUS) 100 UNIT/ML injection Inject 0.8 mLs (80 Units total) into the skin at bedtime. 10 mL 1  . Insulin Pen Needle 31G X 5 MM MISC 1 each by Does not apply route at bedtime. 100 each 2  . ketoconazole (NIZORAL) 2 % cream Apply 1 application topically daily. 30 g 0  . LANTUS SOLOSTAR 100 UNIT/ML Solostar Pen Inject 80 Units into the skin daily at 10 pm. 45 mL 0  . levothyroxine (SYNTHROID, LEVOTHROID) 50 MCG tablet Take 1 tablet (50 mcg total) by mouth daily before breakfast. 30 tablet 1  . linagliptin (TRADJENTA) 5 MG TABS tablet Take 1 tablet (5 mg total) by mouth daily. 30 tablet 2  . losartan-hydrochlorothiazide (HYZAAR) 50-12.5 MG per tablet Take 1 tablet by mouth daily. 90 tablet 1  . metformin (FORTAMET) 500 MG (OSM)  24 hr tablet Take 1 tablet (500 mg total) by mouth 2 (two) times daily with a meal. 180 tablet 0  . ONETOUCH DELICA LANCETS 16X MISC 33 g by Does not apply route 2 (two) times daily. Test twice a day as directed 100 each 2   No current facility-administered medications on file prior to visit.   Allergies  Allergen Reactions  . Aspirin   . Codeine Nausea And Vomiting    RASH  . Duloxetine Hcl   . Lyrica [Pregabalin] Nausea And Vomiting  . Penicillins   . Sulfa Antibiotics Swelling    AIC: 7.29m/dl  Objective: Physical Exam General: The patient is alert and oriented x3 in no acute distress.  Dermatology: Skin is warm, dry and supple bilateral lower extremities. Nails 1-10 are short, no warmth, no erythema,  mild edema to heel on left, no eccymosis, no open lesions present. Integument is otherwise unremarkable.  Neurovascular status: Unchanged   Musculoskeletal: Tenderness to palpation at the medial calcaneal tubercale and through the insertion of the plantar fascia on the left foot. No pain with compression of calcaneus bilateral. No pain with tuning fork to calcaneus bilateral. No pain with calf compression bilateral. There is decreased Ankle joint range of motion left>right. All other joints range of motion within normal limits bilateral. Strength 5/5 in all groups bilateral.   Gait: Cane assisted, Antalgic avoid weight on left heel  Xray, Right/Left foot: 3 Views Normal osseous mineralization. Joint spaces preserved. No fracture/dislocation/boney destruction. Calcaneal spur present with mild thickening of plantar fascia and midfoot OA. No other soft tissue abnormalities or radiopaque foreign bodies.   Assessment and Plan: Problem List Items Addressed This Visit    None    Visit Diagnoses    Left foot pain    -  Primary    Relevant Medications    triamcinolone acetonide (KENALOG) 10 MG/ML injection 10 mg    colchicine 0.6 MG tablet    Other Relevant Orders    DG Foot Complete Left    Plantar fasciitis of left foot        Relevant Medications    triamcinolone acetonide (KENALOG) 10 MG/ML injection 10 mg    Diabetes mellitus without complication (HCC)        Gout of left foot, unspecified cause, unspecified chronicity        Possible with elevated uric acid    Relevant Medications    triamcinolone acetonide (KENALOG) 10 MG/ML injection 10 mg    colchicine 0.6 MG tablet       -Complete examination performed. Discussed with patient in detail the condition of plantar fasciitis, how this occurs and general treatment options. Explained both conservative and surgical treatments.  -After oral consent and aseptic prep, injected a mixture containing 1 ml of 1%  plain lidocaine, 1 ml 0.25%  plain marcaine, 0.5 ml of kenalog 10 and 0.5 ml of dexamethasone phosphate into left heel. Post-injection care discussed with patient.  -No anti-inflammatories given due to allergy  -Recommended good supportive shoes and advised use of OTC insert. -Recommend to continue with CAM boot while tender with transition to shoe and fascial brace on left as tolerated - Explained in detail the use of the night splint which was dispensed at today's visit. -Explained and dispensed to patient daily stretching exercises. -Recommend patient to ice affected area 1-2x daily. -Also reviewed labs noticed uric acid is elevated; patient could be having superimposed gout attack on left given that the foot is very tender with  slight swelling; recommend colchicine if symptoms not improved; rx to pick up at pharmacy.  -Patient to return to office in 3 weeks for follow up or sooner if problems or questions  [place].  Landis Martins, DPM

## 2015-05-14 ENCOUNTER — Encounter: Payer: Self-pay | Admitting: Family Medicine

## 2015-05-14 ENCOUNTER — Ambulatory Visit: Payer: PPO | Admitting: Sports Medicine

## 2015-05-14 ENCOUNTER — Ambulatory Visit (INDEPENDENT_AMBULATORY_CARE_PROVIDER_SITE_OTHER): Payer: PPO | Admitting: Family Medicine

## 2015-05-14 VITALS — BP 122/73 | HR 94 | Temp 98.1°F | Resp 18 | Ht 64.0 in | Wt 176.2 lb

## 2015-05-14 DIAGNOSIS — E1142 Type 2 diabetes mellitus with diabetic polyneuropathy: Secondary | ICD-10-CM

## 2015-05-14 DIAGNOSIS — Z794 Long term (current) use of insulin: Secondary | ICD-10-CM | POA: Diagnosis not present

## 2015-05-14 DIAGNOSIS — E559 Vitamin D deficiency, unspecified: Secondary | ICD-10-CM

## 2015-05-14 DIAGNOSIS — R748 Abnormal levels of other serum enzymes: Secondary | ICD-10-CM | POA: Diagnosis not present

## 2015-05-14 DIAGNOSIS — E038 Other specified hypothyroidism: Secondary | ICD-10-CM

## 2015-05-14 DIAGNOSIS — E785 Hyperlipidemia, unspecified: Secondary | ICD-10-CM | POA: Diagnosis not present

## 2015-05-14 DIAGNOSIS — E538 Deficiency of other specified B group vitamins: Secondary | ICD-10-CM

## 2015-05-14 DIAGNOSIS — E039 Hypothyroidism, unspecified: Secondary | ICD-10-CM | POA: Insufficient documentation

## 2015-05-14 MED ORDER — LEVOTHYROXINE SODIUM 50 MCG PO TABS: 50.0000 ug | ORAL_TABLET | Freq: Every day | ORAL | Status: DC

## 2015-05-14 MED ORDER — METFORMIN HCL ER (OSM) 500 MG PO TB24: 500.0000 mg | ORAL_TABLET | Freq: Two times a day (BID) | ORAL | Status: DC

## 2015-05-14 NOTE — Progress Notes (Signed)
Name: Charlotte Herrera   MRN: [id number]    DOB: Mar 29, 1946   Date:05/14/2015       Progress Note  Subjective  Chief Complaint  Chief Complaint  Patient presents with  . Follow-up    1 mo  . Diabetes  . Hyperlipidemia  . Medication Refill    levothyroxine 0.44m / alprazolam 0.577m/ clopidogrel 7544m metformin 500m84mlosartan 12.5mg 54mDiabetes She presents for her follow-up diabetic visit. She has type 2 diabetes mellitus. Her disease course has been stable. Associated symptoms include fatigue, foot paresthesias and polydipsia. Pertinent negatives for diabetes include no polyuria. Symptoms are stable. Diabetic complications include peripheral neuropathy. Pertinent negatives for diabetic complications include no nephropathy. Current diabetic treatment includes intensive insulin program and oral agent (monotherapy) (Pt. did not pick up Rx for Tradjenta, was very expensive.). She is following a diabetic diet. She has not had a previous visit with a dietitian. Her breakfast blood glucose range is generally 90-110 mg/dl. An ACE inhibitor/angiotensin II receptor blocker is being taken. Eye exam is current.  Hyperlipidemia This is a chronic problem. The problem is uncontrolled. Recent lipid tests were reviewed and are high. She is currently on no antihyperlipidemic treatment (Pt. was reportedly taken off Cholesterol therapy by previous PCP).  Thyroid Problem Presents for follow-up visit. Symptoms include fatigue. Patient reports no cold intolerance, diaphoresis, dry skin, hair loss or leg swelling. Past treatments include levothyroxine. Her past medical history is significant for hyperlipidemia.   Vitamin D deficiency  Pt. Presents for follow up of Vitamin D deficiency, last level obtained in July 2016 was 27.1. She is not on OTC Vitamin D.  Vitamin B12 deficiency  Pt. Is here for follow up of Vitamin B 12 deficiency. Levels obtained in July 2016 were 673. Pt. Is on OTC Vitamin B12.  Past  Medical History  Diagnosis Date  . Enlarged liver   . Diabetes mellitus without complication (HCC) [place] Hypertension   . Hyperlipidemia   . GERD (gastroesophageal reflux disease)   . Thyroid disease   . Stroke (HCC) [place] Depression   . Fibromyalgia affecting multiple sites     Past Surgical History  Procedure Laterality Date  . Varicose vein surgery Left 1975  . Abdominal hysterectomy    . Spine surgery    . Carpal tunnel release Left     Family History  Problem Relation Age of Onset  . Cancer Mother   . Diabetes Father   . Hearing loss Father   . Cancer Father   . Diabetes Brother   . Cancer Brother     Social History   Social History  . Marital Status: Divorced    Spouse Name: N/A  . Number of Children: N/A  . Years of Education: N/A   Occupational History  . Not on file.   Social History Main Topics  . Smoking status: Never Smoker   . Smokeless tobacco: Never Used  . Alcohol Use: No  . Drug Use: No  . Sexual Activity: Not on file   Other Topics Concern  . Not on file   Social History Narrative     Current outpatient prescriptions:  .  ALPRAZolam (XANAX) 0.5 MG tablet, Take 1 tablet (0.5 mg total) by mouth 2 (two) times daily as needed for anxiety., Disp: 60 tablet, Rfl: 0 .  Blood Glucose Monitoring Suppl (ONE TOUCH ULTRA MINI) W/DEVICE KIT, 1 Device by Does not apply route 2 (two) times  daily., Disp: 1 each, Rfl: 0 .  cetirizine (ZYRTEC) 10 MG tablet, Take 10 mg by mouth daily., Disp: , Rfl:  .  cloNIDine (CATAPRES) 0.1 MG tablet, Take 1 tablet (0.1 mg total) by mouth 2 (two) times daily., Disp: 60 tablet, Rfl: 2 .  clopidogrel (PLAVIX) 75 MG tablet, Take 1 tablet (75 mg total) by mouth daily., Disp: 90 tablet, Rfl: 1 .  DEXILANT 60 MG capsule, TK 1 C PO D, Disp: , Rfl: 11 .  fluconazole (DIFLUCAN) 150 MG tablet, Take 1 tablet (150 mg total) by mouth once., Disp: 1 tablet, Rfl: 0 .  fluticasone (FLONASE) 50 MCG/ACT nasal spray, Place 2 sprays into the  nose daily., Disp: , Rfl:  .  gabapentin (NEURONTIN) 300 MG capsule, Take 300 mg by mouth. ONE OR TWO AT NIGHT, Disp: , Rfl:  .  glucose blood test strip, Use as instructed, Disp: 100 each, Rfl: 12 .  insulin glargine (LANTUS) 100 UNIT/ML injection, Inject 0.8 mLs (80 Units total) into the skin at bedtime., Disp: 10 mL, Rfl: 1 .  Insulin Pen Needle 31G X 5 MM MISC, 1 each by Does not apply route at bedtime., Disp: 100 each, Rfl: 2 .  ketoconazole (NIZORAL) 2 % cream, Apply 1 application topically daily., Disp: 30 g, Rfl: 0 .  LANTUS SOLOSTAR 100 UNIT/ML Solostar Pen, Inject 80 Units into the skin daily at 10 pm., Disp: 45 mL, Rfl: 0 .  levothyroxine (SYNTHROID, LEVOTHROID) 50 MCG tablet, Take 1 tablet (50 mcg total) by mouth daily before breakfast., Disp: 30 tablet, Rfl: 1 .  linagliptin (TRADJENTA) 5 MG TABS tablet, Take 1 tablet (5 mg total) by mouth daily., Disp: 30 tablet, Rfl: 2 .  losartan-hydrochlorothiazide (HYZAAR) 50-12.5 MG per tablet, Take 1 tablet by mouth daily., Disp: 90 tablet, Rfl: 1 .  metformin (FORTAMET) 500 MG (OSM) 24 hr tablet, Take 1 tablet (500 mg total) by mouth 2 (two) times daily with a meal., Disp: 180 tablet, Rfl: 0 .  ONETOUCH DELICA LANCETS 52D MISC, 33 g by Does not apply route 2 (two) times daily. Test twice a day as directed, Disp: 100 each, Rfl: 2  Current facility-administered medications:  .  triamcinolone acetonide (KENALOG) 10 MG/ML injection 10 mg, 10 mg, Other, Once, Landis Martins, DPM  Allergies  Allergen Reactions  . Aspirin   . Codeine Nausea And Vomiting    RASH  . Duloxetine Hcl   . Lyrica [Pregabalin] Nausea And Vomiting  . Penicillins   . Sulfa Antibiotics Swelling     Review of Systems  Constitutional: Positive for fatigue. Negative for diaphoresis.  Gastrointestinal: Negative for abdominal pain.  Neurological: Positive for tingling.  Endo/Heme/Allergies: Positive for polydipsia. Negative for cold intolerance.    Objective  Filed  Vitals:   05/14/15 1338  BP: 122/73  Pulse: 94  Temp: 98.1 F (36.7 C)  TempSrc: Oral  Resp: 18  Height: 5' 4"  (1.626 m)  Weight: 176 lb 3.2 oz (79.924 kg)  SpO2: 97%    Physical Exam  Constitutional: She is oriented to person, place, and time and well-developed, well-nourished, and in no distress.  HENT:  Head: Normocephalic and atraumatic.  Eyes: Pupils are equal, round, and reactive to light.  Cardiovascular: Normal rate, regular rhythm and normal heart sounds.   No murmur heard. Pulmonary/Chest: Effort normal and breath sounds normal. She has no wheezes.  Abdominal: Soft. Bowel sounds are normal. There is no tenderness.  Neurological: She is alert and oriented to person,  place, and time.  Psychiatric: Mood, memory, affect and judgment normal.  Nursing note and vitals reviewed.    Assessment & Plan  1. Vitamin B12 deficiency On OTC vitamin B12. Repeat levels today. - B12  2. Type 2 diabetes mellitus with diabetic polyneuropathy, with Defoor-term current use of insulin (HCC) Continue on Lantus and metformin. Recheck A1c in 2 months. - metformin (FORTAMET) 500 MG (OSM) 24 hr tablet; Take 1 tablet (500 mg total) by mouth 2 (two) times daily with a meal.  Dispense: 180 tablet; Refill: 0  3. Abnormal liver enzymes Repeat liver enzymes. Persistently elevated, consider workup for transaminitis  4. Dyslipidemia Patient not on statin therapy secondary to elevated liver enzymes. Recheck levels and follow-up. - Lipid Profile - Comprehensive Metabolic Panel (CMET)  5. Vitamin D deficiency  - Vitamin D (25 hydroxy)  6. Other specified hypothyroidism  - TSH - levothyroxine (SYNTHROID, LEVOTHROID) 50 MCG tablet; Take 1 tablet (50 mcg total) by mouth daily before breakfast.  Dispense: 90 tablet; Refill: 1  Henri Baumler Asad [facility] 05/14/2015 1:54 PM

## 2015-05-15 LAB — COMPREHENSIVE METABOLIC PANEL
ALT: 56 IU/L — ABNORMAL HIGH (ref 0–32)
AST: 61 IU/L — ABNORMAL HIGH (ref 0–40)
Albumin/Globulin Ratio: 1.5 (ref 1.1–2.5)
Albumin: 4.6 g/dL (ref 3.6–4.8)
Alkaline Phosphatase: 57 IU/L (ref 39–117)
BUN/Creatinine Ratio: 13 (ref 11–26)
BUN: 12 mg/dL (ref 8–27)
Bilirubin Total: 0.4 mg/dL (ref 0.0–1.2)
CO2: 24 mmol/L (ref 18–29)
Calcium: 10 mg/dL (ref 8.7–10.3)
Chloride: 100 mmol/L (ref 97–106)
Creatinine, Ser: 0.93 mg/dL (ref 0.57–1.00)
GFR calc Af Amer: 73 mL/min/{1.73_m2} (ref >59)
GFR calc non Af Amer: 63 mL/min/{1.73_m2} (ref >59)
Globulin, Total: 3.1 g/dL (ref 1.5–4.5)
Glucose: 147 mg/dL — ABNORMAL HIGH (ref 65–99)
Potassium: 5.3 mmol/L — ABNORMAL HIGH (ref 3.5–5.2)
Sodium: 141 mmol/L (ref 136–144)
Total Protein: 7.7 g/dL (ref 6.0–8.5)

## 2015-05-15 LAB — VITAMIN D 25 HYDROXY (VIT D DEFICIENCY, FRACTURES): Vit D, 25-Hydroxy: 27.5 ng/mL — ABNORMAL LOW (ref 30.0–100.0)

## 2015-05-15 LAB — VITAMIN B12: Vitamin B-12: 600 pg/mL (ref 211–946)

## 2015-05-15 LAB — LIPID PANEL
Chol/HDL Ratio: 5.9 ratio units — ABNORMAL HIGH (ref 0.0–4.4)
Cholesterol, Total: 229 mg/dL — ABNORMAL HIGH (ref 100–199)
HDL: 39 mg/dL — ABNORMAL LOW (ref >39)
LDL Calculated: 142 mg/dL — ABNORMAL HIGH (ref 0–99)
Triglycerides: 238 mg/dL — ABNORMAL HIGH (ref 0–149)
VLDL Cholesterol Cal: 48 mg/dL — ABNORMAL HIGH (ref 5–40)

## 2015-05-15 LAB — TSH: TSH: 1.22 u[IU]/mL (ref 0.450–4.500)

## 2015-05-16 ENCOUNTER — Telehealth: Payer: Self-pay

## 2015-05-16 MED ORDER — VITAMIN D (ERGOCALCIFEROL) 1.25 MG (50000 UNIT) PO CAPS: 50000.0000 [IU] | ORAL_CAPSULE | ORAL | Status: DC

## 2015-05-16 NOTE — Telephone Encounter (Signed)
Prescription for Vitamin D3 50,000 units has been sent to [facility]

## 2015-05-22 ENCOUNTER — Telehealth: Payer: Self-pay | Admitting: Family Medicine

## 2015-05-22 NOTE — Telephone Encounter (Signed)
Returned call and no response after multiple rings.

## 2015-05-22 NOTE — Telephone Encounter (Signed)
Routed to Dr. Manuella Ghazi for medication

## 2015-05-22 NOTE — Telephone Encounter (Signed)
PT WAS HERE A FEW DAYS AGO. SHE WAS NEEDING CREAM FOR YEAST INFECTION  AND ALSO LOST RX FOR XANAX ( NEEDS GENERIC). NEEDS A RX FOR BOTH [address] DR TO KNOW HAS GOUT IN BOTH FEET. PT HAD LAB WORK YESTERDAY AND IS WANTING THE RESULTS ALSO OF LABS

## 2015-05-28 ENCOUNTER — Encounter: Payer: Self-pay | Admitting: Family Medicine

## 2015-05-28 ENCOUNTER — Ambulatory Visit (INDEPENDENT_AMBULATORY_CARE_PROVIDER_SITE_OTHER): Payer: PPO | Admitting: Family Medicine

## 2015-05-28 VITALS — BP 122/71 | HR 95 | Temp 98.2°F | Resp 17 | Ht 64.0 in | Wt 166.8 lb

## 2015-05-28 DIAGNOSIS — B372 Candidiasis of skin and nail: Secondary | ICD-10-CM

## 2015-05-28 DIAGNOSIS — M1A9XX0 Chronic gout, unspecified, without tophus (tophi): Secondary | ICD-10-CM | POA: Insufficient documentation

## 2015-05-28 DIAGNOSIS — F411 Generalized anxiety disorder: Secondary | ICD-10-CM | POA: Diagnosis not present

## 2015-05-28 DIAGNOSIS — M109 Gout, unspecified: Secondary | ICD-10-CM

## 2015-05-28 DIAGNOSIS — R1011 Right upper quadrant pain: Secondary | ICD-10-CM

## 2015-05-28 MED ORDER — ALLOPURINOL 100 MG PO TABS: 100.0000 mg | ORAL_TABLET | Freq: Every day | ORAL | Status: DC

## 2015-05-28 MED ORDER — KETOCONAZOLE 2 % EX CREA: 1.0000 "application " | TOPICAL_CREAM | Freq: Every day | CUTANEOUS | Status: DC

## 2015-05-28 MED ORDER — ALPRAZOLAM 0.5 MG PO TABS: 0.5000 mg | ORAL_TABLET | Freq: Two times a day (BID) | ORAL | Status: DC | PRN

## 2015-05-28 NOTE — Progress Notes (Signed)
Name: Charlotte Herrera   MRN: [id number]    DOB: [birth year]   Date:05/28/2015       Progress Note  Subjective  Chief Complaint  Chief Complaint  Patient presents with  . Follow-up    Gout  . Diabetes  . Hyperlipidemia   Anxiety Presents for follow-up visit. Symptoms include depressed mood, excessive worry, insomnia, irritability and malaise. Patient reports no chest pain, nervous/anxious behavior or shortness of breath.   Past treatments include benzodiazephines. The treatment provided significant relief. Compliance with prior treatments has been good.  Abdominal Pain This is a new problem. Episode onset: 2 weeks. The pain is located in the RUQ. The pain is at a severity of 8/10. The pain is moderate. The quality of the pain is burning and sharp. The abdominal pain does not radiate. Pertinent negatives include no fever or myalgias. The pain is aggravated by eating ('acts up after eating a full meal'). Relieved by: usually goes away on its own. Prior diagnostic workup includes GI consult (Has seen GI in the past. [place] at that time was reportedly normal.).   Gout Pt. Is here follow up of gout. Most recent attack was last week, involved pain in swelling in right foot. She was seen by Podiatrist and Orthopedics and received a corticosteroid injection in her big toe. Later on, she was started on Colchicine for treatment. A uric acid level obtained in Dr. Ammie Ferrier office was elevated at 7.6. She reports her foot pain and swelling has resolved.   Rash Under the breast Patient requesting refill for ketoconazole, originally prescribed for rash underneath her breasts. She reports rash which came on but has improved under the right breast. No burning. Past Medical History  Diagnosis Date  . Enlarged liver   . Diabetes mellitus without complication ([place])   . Hypertension   . Hyperlipidemia   . GERD (gastroesophageal reflux disease)   . Thyroid disease   . Stroke ([place])   . Depression   . Fibromyalgia  affecting multiple sites     Past Surgical History  Procedure Laterality Date  . Varicose vein surgery Left 1975  . Abdominal hysterectomy    . Spine surgery    . Carpal tunnel release Left     Family History  Problem Relation Age of Onset  . Cancer Mother   . Diabetes Father   . Hearing loss Father   . Cancer Father   . Diabetes Brother   . Cancer Brother     Social History   Social History  . Marital Status: Divorced    Spouse Name: N/A  . Number of Children: N/A  . Years of Education: N/A   Occupational History  . Not on file.   Social History Main Topics  . Smoking status: Never Smoker   . Smokeless tobacco: Never Used  . Alcohol Use: No  . Drug Use: No  . Sexual Activity: Not on file   Other Topics Concern  . Not on file   Social History Narrative     Current outpatient prescriptions:  .  ALPRAZolam (XANAX) 0.5 MG tablet, Take 1 tablet (0.5 mg total) by mouth 2 (two) times daily as needed for anxiety., Disp: 60 tablet, Rfl: 0 .  Blood Glucose Monitoring Suppl (ONE TOUCH ULTRA MINI) W/DEVICE KIT, 1 Device by Does not apply route 2 (two) times daily., Disp: 1 each, Rfl: 0 .  cetirizine (ZYRTEC) 10 MG tablet, Take 10 mg by mouth daily., Disp: , Rfl:  .  cloNIDine (CATAPRES) 0.1 MG tablet, Take 1 tablet (0.1 mg total) by mouth 2 (two) times daily., Disp: 60 tablet, Rfl: 2 .  clopidogrel (PLAVIX) 75 MG tablet, Take 1 tablet (75 mg total) by mouth daily., Disp: 90 tablet, Rfl: 1 .  DEXILANT 60 MG capsule, TK 1 C PO D, Disp: , Rfl: 11 .  fluconazole (DIFLUCAN) 150 MG tablet, Take 1 tablet (150 mg total) by mouth once., Disp: 1 tablet, Rfl: 0 .  fluticasone (FLONASE) 50 MCG/ACT nasal spray, Place 2 sprays into the nose daily., Disp: , Rfl:  .  gabapentin (NEURONTIN) 300 MG capsule, Take 300 mg by mouth. ONE OR TWO AT NIGHT, Disp: , Rfl:  .  glucose blood test strip, Use as instructed, Disp: 100 each, Rfl: 12 .  insulin glargine (LANTUS) 100 UNIT/ML injection,  Inject 0.8 mLs (80 Units total) into the skin at bedtime., Disp: 10 mL, Rfl: 1 .  Insulin Pen Needle 31G X 5 MM MISC, 1 each by Does not apply route at bedtime., Disp: 100 each, Rfl: 2 .  ketoconazole (NIZORAL) 2 % cream, Apply 1 application topically daily., Disp: 30 g, Rfl: 0 .  LANTUS SOLOSTAR 100 UNIT/ML Solostar Pen, Inject 80 Units into the skin daily at 10 pm., Disp: 45 mL, Rfl: 0 .  levothyroxine (SYNTHROID, LEVOTHROID) 50 MCG tablet, Take 1 tablet (50 mcg total) by mouth daily before breakfast., Disp: 90 tablet, Rfl: 1 .  linagliptin (TRADJENTA) 5 MG TABS tablet, Take 1 tablet (5 mg total) by mouth daily., Disp: 30 tablet, Rfl: 2 .  losartan-hydrochlorothiazide (HYZAAR) 50-12.5 MG per tablet, Take 1 tablet by mouth daily., Disp: 90 tablet, Rfl: 1 .  metformin (FORTAMET) 500 MG (OSM) 24 hr tablet, Take 1 tablet (500 mg total) by mouth 2 (two) times daily with a meal., Disp: 180 tablet, Rfl: 0 .  ONETOUCH DELICA LANCETS 33G MISC, 33 g by Does not apply route 2 (two) times daily. Test twice a day as directed, Disp: 100 each, Rfl: 2 .  Vitamin D, Ergocalciferol, (DRISDOL) 50000 UNITS CAPS capsule, Take 1 capsule (50,000 Units total) by mouth once a week., Disp: 12 capsule, Rfl: 0  Current facility-administered medications:  .  triamcinolone acetonide (KENALOG) 10 MG/ML injection 10 mg, 10 mg, Other, Once, Titorya Stover, DPM  Allergies  Allergen Reactions  . Aspirin   . Codeine Nausea And Vomiting    RASH  . Duloxetine Hcl   . Lyrica [Pregabalin] Nausea And Vomiting  . Penicillins   . Sulfa Antibiotics Swelling    Review of Systems  Constitutional: Positive for irritability. Negative for fever and chills.  Respiratory: Negative for shortness of breath.   Cardiovascular: Negative for chest pain.  Gastrointestinal: Positive for abdominal pain.  Musculoskeletal: Negative for myalgias and joint pain.  Psychiatric/Behavioral: Negative for depression. The patient has insomnia. The  patient is not nervous/anxious.     Objective  Filed Vitals:   05/28/15 0921  BP: 122/71  Pulse: 95  Temp: 98.2 F (36.8 C)  TempSrc: Oral  Resp: 17  Height: 5' 4" (1.626 m)  Weight: 166 lb 12.8 oz (75.66 kg)  SpO2: 97%    Physical Exam  Constitutional: She is oriented to person, place, and time and well-developed, well-nourished, and in no distress.  HENT:  Head: Normocephalic and atraumatic.  Cardiovascular: Normal rate and regular rhythm.   Pulmonary/Chest: Effort normal and breath sounds normal.  Abdominal: Soft. Bowel sounds are normal. There is tenderness (mild tenderness to palpation over   the RUQ. No rebound.).  Musculoskeletal:       Right foot: There is no tenderness and no swelling.  Neurological: She is alert and oriented to person, place, and time.  Skin: Skin is warm and dry. Rash noted. Rash is macular. There is erythema.     Psychiatric: Mood, memory, affect and judgment normal.  Nursing note and vitals reviewed.   Assessment & Plan  1. Candidal intertrigo Refill for ketoconazole provided for candidal rash - ketoconazole (NIZORAL) 2 % cream; Apply 1 application topically daily.  Dispense: 30 g; Refill: 0  2. Generalized anxiety disorder Stable and controlled. Patient compliant with controlled substances agreement and understands the side effects and dependence potential of benzodiazepines. Refills provided. - ALPRAZolam (XANAX) 0.5 MG tablet; Take 1 tablet (0.5 mg total) by mouth 2 (two) times daily as needed for anxiety.  Dispense: 60 tablet; Refill: 0  3. Acute gout involving toe of right foot, unspecified cause Patient was provided with a prescription for allopurinol 100 mg daily to be started in 1 week to lower her uric acid levels and the incidence of gout attacks. Ten-year on colchicine prescribed by orthopedist. Follow-up in one month. - allopurinol (ZYLOPRIM) 100 MG tablet; Take 1 tablet (100 mg total) by mouth daily.  Dispense: 30 tablet; Refill:  0  4. RUQ pain Ration has been seen by gastroenterology in the past for similar right-sided abdominal pain. We will obtain a right upper quadrant ultrasound and liver studies and follow-up - Comprehensive Metabolic Panel (CMET) - US Abdomen Limited RUQ; Future - Hepatic function panel    Amit Meloy Asad A[facility] 05/28/2015 9:39 AM

## 2015-05-29 ENCOUNTER — Encounter: Payer: Self-pay | Admitting: Family Medicine

## 2015-06-01 LAB — COMPREHENSIVE METABOLIC PANEL
ALT: 71 IU/L — ABNORMAL HIGH (ref 0–32)
AST: 98 IU/L — ABNORMAL HIGH (ref 0–40)
Albumin/Globulin Ratio: 1.2 (ref 1.1–2.5)
Albumin: 4 g/dL (ref 3.6–4.8)
Alkaline Phosphatase: 79 IU/L (ref 39–117)
BUN/Creatinine Ratio: 15 (ref 11–26)
BUN: 14 mg/dL (ref 8–27)
Bilirubin Total: 0.3 mg/dL (ref 0.0–1.2)
CO2: 23 mmol/L (ref 18–29)
Calcium: 9.6 mg/dL (ref 8.7–10.3)
Chloride: 98 mmol/L (ref 97–106)
Creatinine, Ser: 0.96 mg/dL (ref 0.57–1.00)
GFR calc Af Amer: 70 mL/min/{1.73_m2} (ref >59)
GFR calc non Af Amer: 61 mL/min/{1.73_m2} (ref >59)
Globulin, Total: 3.4 g/dL (ref 1.5–4.5)
Glucose: 197 mg/dL — ABNORMAL HIGH (ref 65–99)
Potassium: 4.4 mmol/L (ref 3.5–5.2)
Sodium: 137 mmol/L (ref 136–144)
Total Protein: 7.4 g/dL (ref 6.0–8.5)

## 2015-06-01 LAB — HEPATIC FUNCTION PANEL: Bilirubin, Direct: 0.13 mg/dL (ref 0.00–0.40)

## 2015-06-06 ENCOUNTER — Telehealth: Payer: Self-pay | Admitting: Family Medicine

## 2015-06-06 ENCOUNTER — Ambulatory Visit: Payer: PPO

## 2015-06-06 NOTE — Telephone Encounter (Signed)
Pt is currently taking Colchicine 6 mg, a new med for her, pt states she is having a lot of side affects with this medication and would like a call back to discuss what she has going on. Pt would like a call back.

## 2015-06-07 NOTE — Telephone Encounter (Signed)
Patient spoke with Dr. Manuella Ghazi on this morning 06/07/2015 and was given advice

## 2015-06-09 ENCOUNTER — Other Ambulatory Visit: Payer: Self-pay | Admitting: Family Medicine

## 2015-06-17 ENCOUNTER — Telehealth: Payer: Self-pay | Admitting: Family Medicine

## 2015-06-17 NOTE — Telephone Encounter (Signed)
Pt had appt she did not know about for a u/s of the liver and when she called the imaging place they said that the order had been canceled for the patient was a no show, but the patient said she did not know about the appt. Could you possibly get the order put back in and let the patient know when it is done so she can have the xray.

## 2015-06-17 NOTE — Telephone Encounter (Signed)
Charlotte Herrera has spoken with patient and order has been resolved.

## 2015-06-19 ENCOUNTER — Ambulatory Visit: Payer: PPO

## 2015-06-27 ENCOUNTER — Ambulatory Visit: Payer: PPO | Admitting: Family Medicine

## 2015-06-29 ENCOUNTER — Other Ambulatory Visit: Payer: Self-pay | Admitting: Family Medicine

## 2015-07-01 ENCOUNTER — Other Ambulatory Visit: Payer: Self-pay | Admitting: Family Medicine

## 2015-07-02 ENCOUNTER — Ambulatory Visit
Admission: RE | Admit: 2015-07-02 | Discharge: 2015-07-02 | Disposition: A | Discharge status: Home / Self Care | Payer: PPO | Source: Ambulatory Visit | Attending: Family Medicine | Admitting: Family Medicine

## 2015-07-02 ENCOUNTER — Other Ambulatory Visit: Payer: Self-pay | Admitting: Family Medicine

## 2015-07-02 DIAGNOSIS — K760 Fatty (change of) liver, not elsewhere classified: Secondary | ICD-10-CM | POA: Insufficient documentation

## 2015-07-02 DIAGNOSIS — R1011 Right upper quadrant pain: Secondary | ICD-10-CM | POA: Diagnosis not present

## 2015-07-03 ENCOUNTER — Other Ambulatory Visit: Payer: Self-pay | Admitting: Family Medicine

## 2015-07-04 ENCOUNTER — Telehealth: Payer: Self-pay

## 2015-07-04 NOTE — Telephone Encounter (Signed)
Medication has already been received by Pharmacy on 07/02/2015, patient has been notified

## 2015-07-05 NOTE — Telephone Encounter (Signed)
Medication has been refilled and sent to [facility] 

## 2015-07-11 ENCOUNTER — Ambulatory Visit (INDEPENDENT_AMBULATORY_CARE_PROVIDER_SITE_OTHER): Payer: PPO | Admitting: Family Medicine

## 2015-07-11 ENCOUNTER — Encounter: Payer: Self-pay | Admitting: Family Medicine

## 2015-07-11 VITALS — BP 122/73 | HR 96 | Temp 97.9°F | Resp 17 | Ht 64.0 in | Wt 172.9 lb

## 2015-07-11 DIAGNOSIS — E1140 Type 2 diabetes mellitus with diabetic neuropathy, unspecified: Secondary | ICD-10-CM

## 2015-07-11 DIAGNOSIS — J0100 Acute maxillary sinusitis, unspecified: Secondary | ICD-10-CM | POA: Diagnosis not present

## 2015-07-11 DIAGNOSIS — Z794 Long term (current) use of insulin: Secondary | ICD-10-CM | POA: Diagnosis not present

## 2015-07-11 DIAGNOSIS — I10 Essential (primary) hypertension: Secondary | ICD-10-CM

## 2015-07-11 DIAGNOSIS — R748 Abnormal levels of other serum enzymes: Secondary | ICD-10-CM

## 2015-07-11 LAB — GLUCOSE, POCT (MANUAL RESULT ENTRY): POC Glucose: 167 mg/dl — AB (ref 70–99)

## 2015-07-11 LAB — POCT GLYCOSYLATED HEMOGLOBIN (HGB A1C): Hemoglobin A1C: 7.5

## 2015-07-11 MED ORDER — FLUTICASONE PROPIONATE 50 MCG/ACT NA SUSP: 2.0000 | Freq: Every day | NASAL | Status: DC

## 2015-07-11 MED ORDER — INSULIN GLARGINE 100 UNIT/ML SOLOSTAR PEN: 80.0000 [IU] | PEN_INJECTOR | Freq: Every day | SUBCUTANEOUS | Status: DC

## 2015-07-11 MED ORDER — AZITHROMYCIN 250 MG PO TABS: ORAL_TABLET | ORAL | Status: DC

## 2015-07-11 MED ORDER — CLONIDINE HCL 0.1 MG PO TABS: 0.1000 mg | ORAL_TABLET | Freq: Two times a day (BID) | ORAL | Status: DC

## 2015-07-11 NOTE — Progress Notes (Signed)
Name: Charlotte Herrera   MRN: [id number]    DOB: 11/09/1945   Date:07/11/2015       Progress Note  Subjective  Chief Complaint  Chief Complaint  Patient presents with  . Medication Refill    Sinusitis This is a new problem. Episode onset: 2-3 weeks. There has been no fever. Associated symptoms include sinus pressure and a sore throat. Pertinent negatives include no chills, coughing, ear pain, headaches or neck pain. Past treatments include nothing.  Diabetes She presents for her follow-up diabetic visit. She has type 2 diabetes mellitus. Her disease course has been stable. There are no hypoglycemic associated symptoms. Pertinent negatives for hypoglycemia include no headaches. Associated symptoms include foot paresthesias (cramping in right toes at night). Pertinent negatives for diabetes include no chest pain, no fatigue, no polydipsia and no polyuria. Diabetic complications include autonomic neuropathy and a CVA (hx of TIA). Current diabetic treatment includes oral agent (monotherapy) and intensive insulin program. She is currently taking insulin at bedtime. Insulin injections are given by patient. Her weight is stable. She monitors blood glucose at home 1-2 x per day. Her breakfast blood glucose range is generally 110-130 mg/dl.  Hypertension This is a chronic problem. The problem is controlled. Pertinent negatives include no chest pain, headaches, neck pain or palpitations. Past treatments include central alpha agonists, diuretics and angiotensin blockers. Hypertensive end-organ damage includes CVA (hx of TIA). There is no history of kidney disease or CAD/MI.    Past Medical History  Diagnosis Date  . Enlarged liver   . Diabetes mellitus without complication ([place])   . Hypertension   . Hyperlipidemia   . GERD (gastroesophageal reflux disease)   . Thyroid disease   . Stroke ([place])   . Depression   . Fibromyalgia affecting multiple sites     Past Surgical History  Procedure Laterality Date   . Varicose vein surgery Left 1975  . Abdominal hysterectomy    . Spine surgery    . Carpal tunnel release Left     Family History  Problem Relation Age of Onset  . Cancer Mother   . Diabetes Father   . Hearing loss Father   . Cancer Father   . Diabetes Brother   . Cancer Brother     Social History   Social History  . Marital Status: Divorced    Spouse Name: N/A  . Number of Children: N/A  . Years of Education: N/A   Occupational History  . Not on file.   Social History Main Topics  . Smoking status: Never Smoker   . Smokeless tobacco: Never Used  . Alcohol Use: No  . Drug Use: No  . Sexual Activity: Not on file   Other Topics Concern  . Not on file   Social History Narrative     Current outpatient prescriptions:  .  allopurinol (ZYLOPRIM) 100 MG tablet, TAKE 1 TABLET(100 MG) BY MOUTH DAILY, Disp: 30 tablet, Rfl: 0 .  allopurinol (ZYLOPRIM) 100 MG tablet, Take 1 tablet (100 mg total) by mouth daily., Disp: 30 tablet, Rfl: 0 .  ALPRAZolam (XANAX) 0.5 MG tablet, TAKE 1 TABLET BY MOUTH TWICE DAILY AS NEEDED FOR ANXIETY, Disp: 60 tablet, Rfl: 0 .  Blood Glucose Monitoring Suppl (ONE TOUCH ULTRA MINI) W/DEVICE KIT, 1 Device by Does not apply route 2 (two) times daily., Disp: 1 each, Rfl: 0 .  cetirizine (ZYRTEC) 10 MG tablet, Take 10 mg by mouth daily., Disp: , Rfl:  .  cloNIDine (CATAPRES) 0.1  MG tablet, Take 1 tablet (0.1 mg total) by mouth 2 (two) times daily., Disp: 60 tablet, Rfl: 2 .  clopidogrel (PLAVIX) 75 MG tablet, Take 1 tablet (75 mg total) by mouth daily., Disp: 90 tablet, Rfl: 1 .  DEXILANT 60 MG capsule, TK 1 C PO D, Disp: , Rfl: 11 .  fluconazole (DIFLUCAN) 150 MG tablet, Take 1 tablet (150 mg total) by mouth once., Disp: 1 tablet, Rfl: 0 .  fluticasone (FLONASE) 50 MCG/ACT nasal spray, Place 2 sprays into the nose daily., Disp: , Rfl:  .  gabapentin (NEURONTIN) 300 MG capsule, Take 300 mg by mouth. ONE OR TWO AT NIGHT, Disp: , Rfl:  .  glucose blood  test strip, Use as instructed, Disp: 100 each, Rfl: 12 .  Insulin Glargine (LANTUS SOLOSTAR) 100 UNIT/ML Solostar Pen, Inject 80 Units into the skin daily at 10 pm., Disp: 15 mL, Rfl: 0 .  insulin glargine (LANTUS) 100 UNIT/ML injection, Inject 0.8 mLs (80 Units total) into the skin at bedtime., Disp: 10 mL, Rfl: 1 .  Insulin Pen Needle 31G X 5 MM MISC, 1 each by Does not apply route at bedtime., Disp: 100 each, Rfl: 2 .  ketoconazole (NIZORAL) 2 % cream, Apply 1 application topically daily., Disp: 30 g, Rfl: 0 .  levothyroxine (SYNTHROID, LEVOTHROID) 50 MCG tablet, Take 1 tablet (50 mcg total) by mouth daily before breakfast., Disp: 90 tablet, Rfl: 1 .  losartan-hydrochlorothiazide (HYZAAR) 50-12.5 MG per tablet, Take 1 tablet by mouth daily., Disp: 90 tablet, Rfl: 1 .  metformin (FORTAMET) 500 MG (OSM) 24 hr tablet, Take 1 tablet (500 mg total) by mouth 2 (two) times daily with a meal., Disp: 180 tablet, Rfl: 0 .  ONETOUCH DELICA LANCETS 40J MISC, 33 g by Does not apply route 2 (two) times daily. Test twice a day as directed, Disp: 100 each, Rfl: 2 .  Vitamin D, Ergocalciferol, (DRISDOL) 50000 UNITS CAPS capsule, Take 1 capsule (50,000 Units total) by mouth once a week., Disp: 12 capsule, Rfl: 0  Current facility-administered medications:  .  triamcinolone acetonide (KENALOG) 10 MG/ML injection 10 mg, 10 mg, Other, Once, Landis Martins, DPM  Allergies  Allergen Reactions  . Aspirin   . Codeine Nausea And Vomiting    RASH  . Duloxetine Hcl   . Lyrica [Pregabalin] Nausea And Vomiting  . Penicillins   . Sulfa Antibiotics Swelling     Review of Systems  Constitutional: Negative for fever, chills and fatigue.  HENT: Positive for sinus pressure and sore throat. Negative for ear pain.   Respiratory: Negative for cough.   Cardiovascular: Negative for chest pain and palpitations.  Musculoskeletal: Negative for neck pain.  Neurological: Negative for headaches.  Endo/Heme/Allergies: Negative  for polydipsia.    Objective  Filed Vitals:   07/11/15 1503  BP: 122/73  Pulse: 96  Temp: 97.9 F (36.6 C)  TempSrc: Oral  Resp: 17  Height: 5' 4"  (1.626 m)  Weight: 172 lb 14.4 oz (78.427 kg)  SpO2: 94%    Physical Exam  Constitutional: She is well-developed, well-nourished, and in no distress.  HENT:  Head: Normocephalic.  Right Ear: Tympanic membrane and ear canal normal.  Nose: Mucosal edema and rhinorrhea present. Right sinus exhibits no maxillary sinus tenderness. Left sinus exhibits maxillary sinus tenderness.  Mouth/Throat: Mucous membranes are normal. No posterior oropharyngeal erythema.  Nasal turbinate erythematous and edematous, nasal secretions visible. R. Ear canal has dried blood visible, no active bleeding, TM normal.  Neck: Neck  supple.  Cardiovascular: Normal rate, regular rhythm and normal heart sounds.   No murmur heard. Pulmonary/Chest: Effort normal and breath sounds normal.  Nursing note and vitals reviewed.   Assessment & Plan  1. Type 2 diabetes mellitus with diabetic neuropathy, with [place]-term current use of insulin (HCC) Stable, A1c improved, continue present therapy. - POCT HgB A1C - POCT Glucose (CBG) - Insulin Glargine (LANTUS SOLOSTAR) 100 UNIT/ML Solostar Pen; Inject 80 Units into the skin daily at 10 pm.  Dispense: 15 mL; Refill: 3  2. 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 Repeat today. - Comprehensive Metabolic Panel (CMET)  3. Essential hypertension BP stable on therapy. - cloNIDine (CATAPRES) 0.1 MG tablet; Take 1 tablet (0.1 mg total) by mouth 2 (two) times daily.  Dispense: 180 tablet; Refill: 2  4. Acute non-recurrent maxillary sinusitis  - azithromycin (ZITHROMAX Z-PAK) 250 MG tablet; 2 tabs po x day 1, then 1 tab po q day x 4 days  Dispense: 6 each; Refill: 0   Genora Arp Asad A[facility] 07/11/2015 3:28 PM

## 2015-07-12 LAB — COMPREHENSIVE METABOLIC PANEL
ALT: 60 IU/L — ABNORMAL HIGH (ref 0–32)
AST: 78 IU/L — ABNORMAL HIGH (ref 0–40)
Albumin/Globulin Ratio: 1.2 (ref 1.1–2.5)
Albumin: 4.3 g/dL (ref 3.5–4.8)
Alkaline Phosphatase: 59 IU/L (ref 39–117)
BUN/Creatinine Ratio: 16 (ref 11–26)
BUN: 14 mg/dL (ref 8–27)
Bilirubin Total: 0.3 mg/dL (ref 0.0–1.2)
CO2: 24 mmol/L (ref 18–29)
Calcium: 10 mg/dL (ref 8.7–10.3)
Chloride: 99 mmol/L (ref 96–106)
Creatinine, Ser: 0.9 mg/dL (ref 0.57–1.00)
GFR calc Af Amer: 75 mL/min/{1.73_m2} (ref >59)
GFR calc non Af Amer: 65 mL/min/{1.73_m2} (ref >59)
Globulin, Total: 3.5 g/dL (ref 1.5–4.5)
Glucose: 162 mg/dL — ABNORMAL HIGH (ref 65–99)
Potassium: 4.3 mmol/L (ref 3.5–5.2)
Sodium: 139 mmol/L (ref 134–144)
Total Protein: 7.8 g/dL (ref 6.0–8.5)

## 2015-07-16 ENCOUNTER — Ambulatory Visit: Payer: PPO | Admitting: Family Medicine

## 2015-07-22 DIAGNOSIS — M5136 Other intervertebral disc degeneration, lumbar region: Secondary | ICD-10-CM | POA: Diagnosis not present

## 2015-07-22 DIAGNOSIS — M9905 Segmental and somatic dysfunction of pelvic region: Secondary | ICD-10-CM | POA: Diagnosis not present

## 2015-07-22 DIAGNOSIS — M5431 Sciatica, right side: Secondary | ICD-10-CM | POA: Diagnosis not present

## 2015-07-22 DIAGNOSIS — M9903 Segmental and somatic dysfunction of lumbar region: Secondary | ICD-10-CM | POA: Diagnosis not present

## 2015-07-29 ENCOUNTER — Other Ambulatory Visit: Payer: Self-pay | Admitting: Family Medicine

## 2015-08-05 DIAGNOSIS — M79673 Pain in unspecified foot: Secondary | ICD-10-CM | POA: Insufficient documentation

## 2015-08-05 DIAGNOSIS — M179 Osteoarthritis of knee, unspecified: Secondary | ICD-10-CM | POA: Insufficient documentation

## 2015-08-05 DIAGNOSIS — M1710 Unilateral primary osteoarthritis, unspecified knee: Secondary | ICD-10-CM | POA: Insufficient documentation

## 2015-08-05 DIAGNOSIS — M79672 Pain in left foot: Secondary | ICD-10-CM | POA: Diagnosis not present

## 2015-08-05 DIAGNOSIS — M1712 Unilateral primary osteoarthritis, left knee: Secondary | ICD-10-CM | POA: Diagnosis not present

## 2015-08-10 ENCOUNTER — Other Ambulatory Visit: Payer: Self-pay | Admitting: Family Medicine

## 2015-08-14 ENCOUNTER — Ambulatory Visit: Payer: PPO | Admitting: Family Medicine

## 2015-08-22 ENCOUNTER — Ambulatory Visit: Payer: PPO | Admitting: Family Medicine

## 2015-08-24 ENCOUNTER — Other Ambulatory Visit: Payer: Self-pay | Admitting: Family Medicine

## 2015-08-28 ENCOUNTER — Other Ambulatory Visit: Payer: Self-pay | Admitting: Family Medicine

## 2015-08-29 ENCOUNTER — Other Ambulatory Visit: Payer: Self-pay | Admitting: Family Medicine

## 2015-09-03 ENCOUNTER — Other Ambulatory Visit: Payer: Self-pay | Admitting: Family Medicine

## 2015-09-03 ENCOUNTER — Ambulatory Visit: Payer: PPO | Admitting: Family Medicine

## 2015-09-03 NOTE — Telephone Encounter (Signed)
Medication has been refilled and sent to [facility] 

## 2015-09-10 ENCOUNTER — Ambulatory Visit (INDEPENDENT_AMBULATORY_CARE_PROVIDER_SITE_OTHER): Payer: PPO | Admitting: Family Medicine

## 2015-09-10 ENCOUNTER — Encounter: Payer: Self-pay | Admitting: Family Medicine

## 2015-09-10 VITALS — BP 120/71 | HR 98 | Temp 98.7°F | Resp 17 | Ht 64.0 in | Wt 175.8 lb

## 2015-09-10 DIAGNOSIS — M1A9XX0 Chronic gout, unspecified, without tophus (tophi): Secondary | ICD-10-CM | POA: Diagnosis not present

## 2015-09-10 DIAGNOSIS — E559 Vitamin D deficiency, unspecified: Secondary | ICD-10-CM

## 2015-09-10 DIAGNOSIS — M9905 Segmental and somatic dysfunction of pelvic region: Secondary | ICD-10-CM | POA: Diagnosis not present

## 2015-09-10 DIAGNOSIS — R748 Abnormal levels of other serum enzymes: Secondary | ICD-10-CM | POA: Diagnosis not present

## 2015-09-10 DIAGNOSIS — E038 Other specified hypothyroidism: Secondary | ICD-10-CM | POA: Diagnosis not present

## 2015-09-10 DIAGNOSIS — J0100 Acute maxillary sinusitis, unspecified: Secondary | ICD-10-CM | POA: Diagnosis not present

## 2015-09-10 DIAGNOSIS — M5136 Other intervertebral disc degeneration, lumbar region: Secondary | ICD-10-CM | POA: Diagnosis not present

## 2015-09-10 DIAGNOSIS — M9903 Segmental and somatic dysfunction of lumbar region: Secondary | ICD-10-CM | POA: Diagnosis not present

## 2015-09-10 DIAGNOSIS — I10 Essential (primary) hypertension: Secondary | ICD-10-CM | POA: Diagnosis not present

## 2015-09-10 DIAGNOSIS — F411 Generalized anxiety disorder: Secondary | ICD-10-CM | POA: Diagnosis not present

## 2015-09-10 DIAGNOSIS — M5431 Sciatica, right side: Secondary | ICD-10-CM | POA: Diagnosis not present

## 2015-09-10 MED ORDER — AZITHROMYCIN 250 MG PO TABS
ORAL_TABLET | ORAL | Status: DC
Start: 2015-09-10 — End: 2015-10-08

## 2015-09-10 MED ORDER — ALPRAZOLAM 0.5 MG PO TABS: 0.5000 mg | ORAL_TABLET | Freq: Two times a day (BID) | ORAL | Status: DC | PRN

## 2015-09-10 MED ORDER — LOSARTAN POTASSIUM-HCTZ 50-12.5 MG PO TABS: 1.0000 | ORAL_TABLET | Freq: Every day | ORAL | Status: DC

## 2015-09-10 MED ORDER — LEVOTHYROXINE SODIUM 50 MCG PO TABS: 50.0000 ug | ORAL_TABLET | Freq: Every day | ORAL | Status: DC

## 2015-09-10 MED ORDER — ALLOPURINOL 100 MG PO TABS: 100.0000 mg | ORAL_TABLET | Freq: Every day | ORAL | Status: DC

## 2015-09-10 NOTE — Progress Notes (Signed)
Name: Charlotte Herrera   MRN: [id number]    DOB: 06-28-1946   Date:09/10/2015       Progress Note  Subjective  Chief Complaint  Chief Complaint  Patient presents with  . Medication Refill    all meds    HPI  Anxiety: Symptoms include feeling nervous, anxious, difficulty sleeping. Also has depression (lost 3 family members in last 2 weeks). Takes Alprazolam 0.64m twice daily PRN.  Hypertension: BP within normal range today. No chest pain, shortness of breath (sometimes has chest pain on the left side at night). Taking Losartan-HCTZ 50-12.5 mg daily.  Hypothyroidism: Takes Levothyroxine 50 mcg every morning. Last TSH was within normal range. Has fatigue, constipation, and dry skin, increased sweating at night.   Gout: Last gout attack in December 2016, affected her feet. Uric acid level was 9.5 obtained in October 2016.  Sinusitis: Symptoms present for a week or so, include headaches, dizziness, sneezing. No coughing or wheezing. No nasal discharge.  Past Medical History  Diagnosis Date  . Enlarged liver   . Diabetes [place] ([place]   . Hypertension   . Hyperlipidemia   . GERD (gastroesophageal reflux disease)   . Thyroid disease   . Stroke ([place]   . Depression   . Fibromyalgia affecting multiple sites     Past Surgical History  Procedure Laterality Date  . Varicose vein surgery Left 1975  . Abdominal hysterectomy    . Spine surgery    . Carpal tunnel release Left     Family History  Problem Relation Age of Onset  . Cancer Mother   . Diabetes Father   . Hearing loss Father   . Cancer Father   . Diabetes Brother   . Cancer Brother     Social History   Social History  . Marital Status: Divorced    Spouse Name: N/A  . Number of Children: N/A  . Years of Education: N/A   Occupational History  . Not on file.   Social History Main Topics  . Smoking status: Never Smoker   . Smokeless tobacco: Never Used  . Alcohol Use: No  . Drug Use: No  .  Sexual Activity: Not on file   Other Topics Concern  . Not on file   Social History Narrative     Current outpatient prescriptions:  .  allopurinol (ZYLOPRIM) 100 MG tablet, TAKE 1 TABLET(100 MG) BY MOUTH DAILY, Disp: 30 tablet, Rfl: 0 .  allopurinol (ZYLOPRIM) 100 MG tablet, Take 1 tablet (100 mg total) by mouth daily., Disp: 30 tablet, Rfl: 0 .  ALPRAZolam (XANAX) 0.5 MG tablet, TAKE 1 TABLET BY MOUTH TWICE DAILY AS NEEDED FOR ANXIETY, Disp: 60 tablet, Rfl: 0 .  azithromycin (ZITHROMAX Z-PAK) 250 MG tablet, 2 tabs po x day 1, then 1 tab po q day x 4 days, Disp: 6 each, Rfl: 0 .  B-D UF III MINI PEN NEEDLES 31G X 5 MM MISC, use as directed at bedtime, Disp: 100 each, Rfl: 2 .  Blood Glucose Monitoring Suppl (ONE TOUCH ULTRA MINI) W/DEVICE KIT, 1 Device by Does not apply route 2 (two) times daily., Disp: 1 each, Rfl: 0 .  cetirizine (ZYRTEC) 10 MG tablet, Take 10 mg by mouth daily., Disp: , Rfl:  .  cloNIDine (CATAPRES) 0.1 MG tablet, Take 1 tablet (0.1 mg total) by mouth 2 (two) times daily., Disp: 180 tablet, Rfl: 2 .  clopidogrel (PLAVIX) 75 MG tablet, Take 1 tablet (75 mg total) by  mouth daily., Disp: 90 tablet, Rfl: 1 .  DEXILANT 60 MG capsule, TK 1 C PO D, Disp: , Rfl: 11 .  fluconazole (DIFLUCAN) 150 MG tablet, Take 1 tablet (150 mg total) by mouth once., Disp: 1 tablet, Rfl: 0 .  fluticasone (FLONASE) 50 MCG/ACT nasal spray, Place 2 sprays into both nostrils daily., Disp: 16 g, Rfl: 0 .  gabapentin (NEURONTIN) 300 MG capsule, Take 300 mg by mouth. ONE OR TWO AT NIGHT, Disp: , Rfl:  .  glucose blood test strip, Use as instructed, Disp: 100 each, Rfl: 12 .  Insulin Glargine (LANTUS SOLOSTAR) 100 UNIT/ML Solostar Pen, Inject 80 Units into the skin daily at 10 pm., Disp: 15 mL, Rfl: 3 .  insulin glargine (LANTUS) 100 UNIT/ML injection, Inject 0.8 mLs (80 Units total) into the skin at bedtime., Disp: 10 mL, Rfl: 1 .  ketoconazole (NIZORAL) 2 % cream, Apply 1 application topically daily.,  Disp: 30 g, Rfl: 0 .  levothyroxine (SYNTHROID, LEVOTHROID) 50 MCG tablet, Take 1 tablet (50 mcg total) by mouth daily before breakfast., Disp: 90 tablet, Rfl: 1 .  losartan-hydrochlorothiazide (HYZAAR) 50-12.5 MG per tablet, Take 1 tablet by mouth daily., Disp: 90 tablet, Rfl: 1 .  metformin (FORTAMET) 500 MG (OSM) 24 hr tablet, Take 1 tablet (500 mg total) by mouth 2 (two) times daily with a meal., Disp: 180 tablet, Rfl: 0 .  ONETOUCH DELICA LANCETS 01U MISC, 33 g by Does not apply route 2 (two) times daily. Test twice a day as directed, Disp: 100 each, Rfl: 2 .  Vitamin D, Ergocalciferol, (DRISDOL) 50000 UNITS CAPS capsule, Take 1 capsule (50,000 Units total) by mouth once a week. (Patient not taking: Reported on 09/10/2015), Disp: 12 capsule, Rfl: 0  Current facility-administered medications:  .  triamcinolone acetonide (KENALOG) 10 MG/ML injection 10 mg, 10 mg, Other, Once, Landis Martins, DPM  Allergies  Allergen Reactions  . Aspirin   . Codeine Nausea And Vomiting    RASH  . Duloxetine Hcl   . Lyrica [Pregabalin] Nausea And Vomiting  . Penicillins   . Sulfa Antibiotics Swelling     Review of Systems  Constitutional: Positive for chills (has hot and cold spells) and malaise/fatigue. Negative for fever.  Eyes: Positive for blurred vision (sometimes has blurred vision.). Negative for double vision.  Respiratory: Negative for shortness of breath.   Cardiovascular: Negative for chest pain.  Gastrointestinal: Positive for constipation.  Musculoskeletal: Negative for joint pain.  Neurological: Positive for headaches.  Psychiatric/Behavioral: The patient is nervous/anxious and has insomnia.     Objective  Filed Vitals:   09/10/15 1602  BP: 120/71  Pulse: 98  Temp: 98.7 F (37.1 C)  TempSrc: Oral  Resp: 17  Height: _0  (1.626 m)  Weight: 175 lb 12.8 oz (79.742 kg)  SpO2: 96%    Physical Exam  Constitutional: She is oriented to person, place, and time and  well-developed, well-nourished, and in no distress.  HENT:  Right Ear: Tympanic membrane and ear canal normal.  Left Ear: Tympanic membrane and ear canal normal.  Nose: Right sinus exhibits no maxillary sinus tenderness and no frontal sinus tenderness. Left sinus exhibits maxillary sinus tenderness and frontal sinus tenderness.  Mouth/Throat: No posterior oropharyngeal erythema.  Nasal turbinate hypertrophy.  Cardiovascular: Normal rate and regular rhythm.   Pulmonary/Chest: Effort normal and breath sounds normal.  Abdominal: Soft. Bowel sounds are normal.  Musculoskeletal:       Right ankle: She exhibits no swelling.  Left ankle: She exhibits no swelling.  Neurological: She is alert and oriented to person, place, and time.  Nursing note and vitals reviewed.    Assessment & Plan  1. Other specified hypothyroidism  - levothyroxine (SYNTHROID, LEVOTHROID) 50 MCG tablet; Take 1 tablet (50 mcg total) by mouth daily before breakfast.  Dispense: 90 tablet; Refill: 1 - TSH  2. Essential hypertension  - losartan-hydrochlorothiazide (HYZAAR) 50-12.5 MG tablet; Take 1 tablet by mouth daily.  Dispense: 90 tablet; Refill: 1  3. Abnormal liver enzymes  - Comprehensive Metabolic Panel (CMET)  4. Vitamin D deficiency  - Vitamin D (25 hydroxy)  5. Generalized anxiety disorder  - ALPRAZolam (XANAX) 0.5 MG tablet; Take 1 tablet (0.5 mg total) by mouth 2 (two) times daily as needed. for anxiety  Dispense: 60 tablet; Refill: 0  6. Acute non-recurrent maxillary sinusitis  - azithromycin (ZITHROMAX) 250 MG tablet; 2 tabs po x day 1 then 1 tab po q day x 4 days  Dispense: 6 tablet; Refill: 0  7. Chronic gout involving toe of right foot without tophus, unspecified cause  - allopurinol (ZYLOPRIM) 100 MG tablet; Take 1 tablet (100 mg total) by mouth daily.  Dispense: 30 tablet; Refill: 0 - Uric acid   Almee Pelphrey Asad A[facility] 09/10/2015 4:40 PM

## 2015-09-13 DIAGNOSIS — M9905 Segmental and somatic dysfunction of pelvic region: Secondary | ICD-10-CM | POA: Diagnosis not present

## 2015-09-13 DIAGNOSIS — M5431 Sciatica, right side: Secondary | ICD-10-CM | POA: Diagnosis not present

## 2015-09-13 DIAGNOSIS — M5136 Other intervertebral disc degeneration, lumbar region: Secondary | ICD-10-CM | POA: Diagnosis not present

## 2015-09-13 DIAGNOSIS — M9903 Segmental and somatic dysfunction of lumbar region: Secondary | ICD-10-CM | POA: Diagnosis not present

## 2015-09-17 ENCOUNTER — Telehealth: Payer: Self-pay | Admitting: Family Medicine

## 2015-09-17 NOTE — Telephone Encounter (Signed)
Routed to Dr. Manuella Ghazi for new prescription approval

## 2015-09-17 NOTE — Telephone Encounter (Signed)
Patient was given a zpack and it only helped for a short while. Patient does not have fever however she is experiencing sore throat and ear ache. Requesting a prescription for antibiotic. Please send to [email or web address].

## 2015-09-18 NOTE — Telephone Encounter (Signed)
Please schedule for an appointment for evaluation of symptoms and prescription of appropriate antibiotic therapy

## 2015-09-19 NOTE — Telephone Encounter (Signed)
Patient called wanting to know if her antibiotic request had been completed.  I informed patient that per Dr. Manuella Ghazi she would have to come in for a scheduled appointment for evaluation of her current symptoms and prescription of appropriate antibiotic therapy.  Patient stated that she was not coming in again and that she would just find another doctor that cared.

## 2015-09-25 ENCOUNTER — Other Ambulatory Visit: Payer: Self-pay | Admitting: Family Medicine

## 2015-09-25 DIAGNOSIS — Z1231 Encounter for screening mammogram for malignant neoplasm of breast: Secondary | ICD-10-CM

## 2015-10-01 ENCOUNTER — Ambulatory Visit
Admission: RE | Admit: 2015-10-01 | Discharge: 2015-10-01 | Disposition: A | Discharge status: Home / Self Care | Payer: PPO | Source: Ambulatory Visit | Attending: Family Medicine | Admitting: Family Medicine

## 2015-10-01 DIAGNOSIS — E038 Other specified hypothyroidism: Secondary | ICD-10-CM | POA: Diagnosis not present

## 2015-10-01 DIAGNOSIS — E559 Vitamin D deficiency, unspecified: Secondary | ICD-10-CM | POA: Diagnosis not present

## 2015-10-01 DIAGNOSIS — M1A9XX0 Chronic gout, unspecified, without tophus (tophi): Secondary | ICD-10-CM | POA: Diagnosis not present

## 2015-10-01 DIAGNOSIS — Z1231 Encounter for screening mammogram for malignant neoplasm of breast: Secondary | ICD-10-CM | POA: Diagnosis not present

## 2015-10-01 DIAGNOSIS — R748 Abnormal levels of other serum enzymes: Secondary | ICD-10-CM | POA: Diagnosis not present

## 2015-10-02 ENCOUNTER — Telehealth: Payer: Self-pay

## 2015-10-02 LAB — COMPREHENSIVE METABOLIC PANEL
ALT: 68 IU/L — ABNORMAL HIGH (ref 0–32)
AST: 98 IU/L — ABNORMAL HIGH (ref 0–40)
Albumin/Globulin Ratio: 1.3 (ref 1.2–2.2)
Albumin: 4.4 g/dL (ref 3.5–4.8)
Alkaline Phosphatase: 67 IU/L (ref 39–117)
BUN/Creatinine Ratio: 18 (ref 12–28)
BUN: 19 mg/dL (ref 8–27)
Bilirubin Total: 0.5 mg/dL (ref 0.0–1.2)
CO2: 20 mmol/L (ref 18–29)
Calcium: 10 mg/dL (ref 8.7–10.3)
Chloride: 99 mmol/L (ref 96–106)
Creatinine, Ser: 1.07 mg/dL — ABNORMAL HIGH (ref 0.57–1.00)
GFR calc Af Amer: 61 mL/min/{1.73_m2} (ref >59)
GFR calc non Af Amer: 53 mL/min/{1.73_m2} — ABNORMAL LOW (ref >59)
Globulin, Total: 3.3 g/dL (ref 1.5–4.5)
Glucose: 231 mg/dL — ABNORMAL HIGH (ref 65–99)
Potassium: 4.3 mmol/L (ref 3.5–5.2)
Sodium: 140 mmol/L (ref 134–144)
Total Protein: 7.7 g/dL (ref 6.0–8.5)

## 2015-10-02 LAB — URIC ACID: Uric Acid: 6.4 mg/dL (ref 2.5–7.1)

## 2015-10-02 LAB — TSH: TSH: 1.46 u[IU]/mL (ref 0.450–4.500)

## 2015-10-02 LAB — VITAMIN D 25 HYDROXY (VIT D DEFICIENCY, FRACTURES): Vit D, 25-Hydroxy: 21.1 ng/mL — ABNORMAL LOW (ref 30.0–100.0)

## 2015-10-02 MED ORDER — VITAMIN D (ERGOCALCIFEROL) 1.25 MG (50000 UNIT) PO CAPS
50000.0000 [IU] | ORAL_CAPSULE | ORAL | Status: DC
Start: 2015-10-02 — End: 2016-01-23

## 2015-10-02 NOTE — Telephone Encounter (Signed)
Lab results have been reported to patient and a prescription for Vitamin D 50,000 units take 1 capsule by mouth once a week for 12 weeks has been sent to [facility] per Dr. Manuella Ghazi, patient has been notified

## 2015-10-08 ENCOUNTER — Telehealth: Payer: Self-pay

## 2015-10-08 ENCOUNTER — Encounter: Payer: Self-pay | Admitting: Family Medicine

## 2015-10-08 ENCOUNTER — Ambulatory Visit (INDEPENDENT_AMBULATORY_CARE_PROVIDER_SITE_OTHER): Payer: PPO | Admitting: Family Medicine

## 2015-10-08 VITALS — BP 122/68 | HR 95 | Temp 97.9°F | Resp 16 | Ht 64.0 in | Wt 175.0 lb

## 2015-10-08 DIAGNOSIS — Z794 Long term (current) use of insulin: Secondary | ICD-10-CM

## 2015-10-08 DIAGNOSIS — M1A9XX0 Chronic gout, unspecified, without tophus (tophi): Secondary | ICD-10-CM

## 2015-10-08 DIAGNOSIS — E1142 Type 2 diabetes mellitus with diabetic polyneuropathy: Secondary | ICD-10-CM

## 2015-10-08 MED ORDER — ALLOPURINOL 100 MG PO TABS: 100.0000 mg | ORAL_TABLET | Freq: Two times a day (BID) | ORAL | Status: DC

## 2015-10-08 MED ORDER — METFORMIN HCL ER (OSM) 500 MG PO TB24: 500.0000 mg | ORAL_TABLET | Freq: Two times a day (BID) | ORAL | Status: DC

## 2015-10-08 NOTE — Telephone Encounter (Signed)
Medication has been refilled and sent to [facility] 

## 2015-10-08 NOTE — Progress Notes (Signed)
Name: Charlotte Herrera   MRN: [id number]    DOB: [birth year]   Date:10/08/2015       Progress Note  Subjective  Chief Complaint  Chief Complaint  Patient presents with  . Follow-up    3 mo   . Diabetes    Diabetes She presents for her follow-up diabetic visit. She has type 2 diabetes mellitus. Her disease course has been stable. Pertinent negatives for diabetes include no chest pain. Current diabetic treatment includes intensive insulin program and oral agent (monotherapy). She is following a diabetic diet. Her breakfast blood glucose range is generally 110-130 mg/dl. Eye exam is not current.     Past Medical History  Diagnosis Date  . Enlarged liver   . Diabetes mellitus without complication ([place])   . Hypertension   . Hyperlipidemia   . GERD (gastroesophageal reflux disease)   . Thyroid disease   . Stroke ([place])   . Depression   . Fibromyalgia affecting multiple sites     Past Surgical History  Procedure Laterality Date  . Varicose vein surgery Left 1975  . Abdominal hysterectomy    . Spine surgery    . Carpal tunnel release Left     Family History  Problem Relation Age of Onset  . Cancer Mother   . Breast cancer Mother     16's  . Diabetes Father   . Hearing loss Father   . Cancer Father   . Diabetes Brother   . Cancer Brother   . Breast cancer Maternal Aunt     40's    Social History   Social History  . Marital Status: Divorced    Spouse Name: N/A  . Number of Children: N/A  . Years of Education: N/A   Occupational History  . Not on file.   Social History Main Topics  . Smoking status: Never Smoker   . Smokeless tobacco: Never Used  . Alcohol Use: No  . Drug Use: No  . Sexual Activity: Not on file   Other Topics Concern  . Not on file   Social History Narrative     Current outpatient prescriptions:  .  ALPRAZolam (XANAX) 0.5 MG tablet, Take 1 tablet (0.5 mg total) by mouth 2 (two) times daily as needed. for anxiety, Disp: 60 tablet, Rfl: 0 .   B-D UF III MINI PEN NEEDLES 31G X 5 MM MISC, use as directed at bedtime, Disp: 100 each, Rfl: 2 .  Blood Glucose Monitoring Suppl (ONE TOUCH ULTRA MINI) W/DEVICE KIT, 1 Device by Does not apply route 2 (two) times daily., Disp: 1 each, Rfl: 0 .  cetirizine (ZYRTEC) 10 MG tablet, Take 10 mg by mouth daily., Disp: , Rfl:  .  cloNIDine (CATAPRES) 0.1 MG tablet, Take 1 tablet (0.1 mg total) by mouth 2 (two) times daily., Disp: 180 tablet, Rfl: 2 .  clopidogrel (PLAVIX) 75 MG tablet, Take 1 tablet (75 mg total) by mouth daily., Disp: 90 tablet, Rfl: 1 .  DEXILANT 60 MG capsule, TK 1 C PO D, Disp: , Rfl: 11 .  fluticasone (FLONASE) 50 MCG/ACT nasal spray, SHAKE LQ AND U 2 SPRAYS IEN D, Disp: , Rfl: 0 .  gabapentin (NEURONTIN) 300 MG capsule, Take 300 mg by mouth. ONE OR TWO AT NIGHT, Disp: , Rfl:  .  glucose blood test strip, Use as instructed, Disp: 100 each, Rfl: 12 .  Insulin Glargine (LANTUS SOLOSTAR) 100 UNIT/ML Solostar Pen, Inject 80 Units into the skin daily at 10 pm.,  Disp: 15 mL, Rfl: 3 .  insulin glargine (LANTUS) 100 UNIT/ML injection, Inject 0.8 mLs (80 Units total) into the skin at bedtime., Disp: 10 mL, Rfl: 1 .  ketoconazole (NIZORAL) 2 % cream, Apply 1 application topically daily., Disp: 30 g, Rfl: 0 .  levothyroxine (SYNTHROID, LEVOTHROID) 50 MCG tablet, Take 1 tablet (50 mcg total) by mouth daily before breakfast., Disp: 90 tablet, Rfl: 1 .  losartan-hydrochlorothiazide (HYZAAR) 50-12.5 MG tablet, Take 1 tablet by mouth daily., Disp: 90 tablet, Rfl: 1 .  ONETOUCH DELICA LANCETS 66Z MISC, 33 g by Does not apply route 2 (two) times daily. Test twice a day as directed, Disp: 100 each, Rfl: 2 .  Vitamin D, Ergocalciferol, (DRISDOL) 50000 units CAPS capsule, Take 1 capsule (50,000 Units total) by mouth once a week. For 12 weeks, Disp: 12 capsule, Rfl: 0 .  allopurinol (ZYLOPRIM) 100 MG tablet, Take 1 tablet (100 mg total) by mouth 2 (two) times daily., Disp: 180 tablet, Rfl: 0 .  metformin  (FORTAMET) 500 MG (OSM) 24 hr tablet, Take 1 tablet (500 mg total) by mouth 2 (two) times daily with a meal., Disp: 180 tablet, Rfl: 0  Current facility-administered medications:  .  triamcinolone acetonide (KENALOG) 10 MG/ML injection 10 mg, 10 mg, Other, Once, Landis Martins, DPM  Allergies  Allergen Reactions  . Aspirin   . Codeine Nausea And Vomiting    RASH  . Duloxetine Hcl   . Lyrica [Pregabalin] Nausea And Vomiting  . Penicillins   . Sulfa Antibiotics Swelling     Review of Systems  Cardiovascular: Negative for chest pain.  Gastrointestinal: Positive for abdominal pain (RUQ abdominal pain, especially at bedtime). Negative for nausea and vomiting.    Objective  Filed Vitals:   10/08/15 1329  BP: 122/68  Pulse: 95  Temp: 97.9 F (36.6 C)  TempSrc: Oral  Resp: 16  Height: 5' 4"  (1.626 m)  Weight: 175 lb (79.379 kg)  SpO2: 96%    Physical Exam  Constitutional: She is oriented to person, place, and time and well-developed, well-nourished, and in no distress.  HENT:  Head: Normocephalic and atraumatic.  Cardiovascular: Normal rate and regular rhythm.   Pulmonary/Chest: Effort normal and breath sounds normal.  Neurological: She is alert and oriented to person, place, and time.  Nursing note and vitals reviewed.       Assessment & Plan  1. Type 2 diabetes mellitus with diabetic polyneuropathy, with Glauber-term current use of insulin (HCC) Refills for metformin provided. Patient will return in one week to obtain an A1c - metformin (FORTAMET) 500 MG (OSM) 24 hr tablet; Take 1 tablet (500 mg total) by mouth 2 (two) times daily with a meal.  Dispense: 180 tablet; Refill: 1   Charlotte Herrera [facility] 10/08/2015 1:41 PM

## 2015-10-15 ENCOUNTER — Ambulatory Visit (INDEPENDENT_AMBULATORY_CARE_PROVIDER_SITE_OTHER): Payer: PPO

## 2015-10-15 ENCOUNTER — Telehealth: Payer: Self-pay | Admitting: Family Medicine

## 2015-10-15 DIAGNOSIS — E1149 Type 2 diabetes mellitus with other diabetic neurological complication: Secondary | ICD-10-CM

## 2015-10-15 DIAGNOSIS — F411 Generalized anxiety disorder: Secondary | ICD-10-CM

## 2015-10-15 LAB — POCT GLYCOSYLATED HEMOGLOBIN (HGB A1C): Hemoglobin A1C: 7.7

## 2015-10-15 MED ORDER — ALPRAZOLAM 0.5 MG PO TABS: 0.5000 mg | ORAL_TABLET | Freq: Two times a day (BID) | ORAL | Status: DC | PRN

## 2015-10-15 NOTE — Telephone Encounter (Signed)
Prescription for Xanax 0.5 mg is ready for pickup

## 2015-10-15 NOTE — Telephone Encounter (Signed)
Patient is requesting a refill on xanax 0.5mg . Patient's daughter states that she is out.  Patient's last office visit was on 10/08/15 and she is needing medication today.

## 2015-10-16 ENCOUNTER — Encounter: Admission: RE | Payer: Self-pay | Source: Ambulatory Visit

## 2015-10-16 ENCOUNTER — Ambulatory Visit: Admission: RE | Admit: 2015-10-16 | Payer: PPO | Source: Ambulatory Visit | Admitting: Ophthalmology

## 2015-10-16 SURGERY — PHACOEMULSIFICATION, CATARACT, WITH IOL INSERTION
Anesthesia: Topical | Laterality: Left

## 2015-10-17 ENCOUNTER — Ambulatory Visit: Payer: PPO | Admitting: Nurse Practitioner

## 2015-10-22 ENCOUNTER — Other Ambulatory Visit: Payer: Self-pay | Admitting: Family Medicine

## 2015-10-24 ENCOUNTER — Other Ambulatory Visit: Payer: Self-pay | Admitting: Gastroenterology

## 2015-10-28 ENCOUNTER — Telehealth: Payer: Self-pay | Admitting: Family Medicine

## 2015-10-28 NOTE — Telephone Encounter (Signed)
Pt would like results of her sugar test that she states she had last week. Please advise.

## 2015-10-29 NOTE — Telephone Encounter (Signed)
Patient said she was still waiting for the refill on Dexilant. Please call her today.

## 2015-10-31 NOTE — Telephone Encounter (Signed)
Results have been reported to patient 

## 2015-11-05 ENCOUNTER — Ambulatory Visit (INDEPENDENT_AMBULATORY_CARE_PROVIDER_SITE_OTHER): Payer: PPO | Admitting: Family Medicine

## 2015-11-05 ENCOUNTER — Encounter: Payer: Self-pay | Admitting: Family Medicine

## 2015-11-05 VITALS — BP 112/70 | HR 96 | Temp 98.1°F | Resp 17 | Ht 64.0 in | Wt 175.7 lb

## 2015-11-05 DIAGNOSIS — R1904 Left lower quadrant abdominal swelling, mass and lump: Secondary | ICD-10-CM | POA: Diagnosis not present

## 2015-11-05 DIAGNOSIS — E538 Deficiency of other specified B group vitamins: Secondary | ICD-10-CM

## 2015-11-05 DIAGNOSIS — E1142 Type 2 diabetes mellitus with diabetic polyneuropathy: Secondary | ICD-10-CM | POA: Diagnosis not present

## 2015-11-05 DIAGNOSIS — E1165 Type 2 diabetes mellitus with hyperglycemia: Secondary | ICD-10-CM

## 2015-11-05 DIAGNOSIS — IMO0002 Reserved for concepts with insufficient information to code with codable children: Secondary | ICD-10-CM

## 2015-11-05 MED ORDER — INSULIN GLARGINE 100 UNIT/ML SOLOSTAR PEN: 80.0000 [IU] | PEN_INJECTOR | Freq: Every day | SUBCUTANEOUS | Status: DC

## 2015-11-05 MED ORDER — LINAGLIPTIN 5 MG PO TABS: 5.0000 mg | ORAL_TABLET | Freq: Every day | ORAL | Status: DC

## 2015-11-05 NOTE — Progress Notes (Signed)
Name: Charlotte Herrera   MRN: [id number]    DOB: 06/09/1946   Date:11/05/2015       Progress Note  Subjective  Chief Complaint  Chief Complaint  Patient presents with  . Follow-up    Discuss meds    Diabetes She presents for her follow-up diabetic visit. She has type 2 diabetes mellitus. Her disease course has been worsening. Associated symptoms include fatigue, polydipsia and polyuria. Symptoms are worsening. Current diabetic treatment includes oral agent (monotherapy) and intensive insulin program. Her weight is stable.   Mass in Left lower Abdomen: Pt. Reports experiencing a 'knot' in her left lower abdominal area, notived for 2-3 months, worse with constipation and straining. No pain but hurts when she presses on it.  Past Medical History  Diagnosis Date  . Enlarged liver   . Diabetes mellitus without complication ([place])   . Hypertension   . Hyperlipidemia   . GERD (gastroesophageal reflux disease)   . Thyroid disease   . Stroke ([place])   . Depression   . Fibromyalgia affecting multiple sites     Past Surgical History  Procedure Laterality Date  . Varicose vein surgery Left 1975  . Abdominal hysterectomy    . Spine surgery    . Carpal tunnel release Left     Family History  Problem Relation Age of Onset  . Cancer Mother   . Breast cancer Mother     57's  . Diabetes Father   . Hearing loss Father   . Cancer Father   . Diabetes Brother   . Cancer Brother   . Breast cancer Maternal Aunt     40's    Social History   Social History  . Marital Status: Divorced    Spouse Name: N/A  . Number of Children: N/A  . Years of Education: N/A   Occupational History  . Not on file.   Social History Main Topics  . Smoking status: Never Smoker   . Smokeless tobacco: Never Used  . Alcohol Use: No  . Drug Use: No  . Sexual Activity: Not on file   Other Topics Concern  . Not on file   Social History Narrative     Current outpatient prescriptions:  .  allopurinol  (ZYLOPRIM) 100 MG tablet, Take 1 tablet (100 mg total) by mouth 2 (two) times daily., Disp: 180 tablet, Rfl: 0 .  ALPRAZolam (XANAX) 0.5 MG tablet, Take 1 tablet (0.5 mg total) by mouth 2 (two) times daily as needed. for anxiety, Disp: 60 tablet, Rfl: 0 .  B-D UF III MINI PEN NEEDLES 31G X 5 MM MISC, use as directed at bedtime, Disp: 100 each, Rfl: 2 .  Blood Glucose Monitoring Suppl (ONE TOUCH ULTRA MINI) W/DEVICE KIT, 1 Device by Does not apply route 2 (two) times daily., Disp: 1 each, Rfl: 0 .  cetirizine (ZYRTEC) 10 MG tablet, Take 10 mg by mouth daily., Disp: , Rfl:  .  cloNIDine (CATAPRES) 0.1 MG tablet, Take 1 tablet (0.1 mg total) by mouth 2 (two) times daily., Disp: 180 tablet, Rfl: 2 .  clopidogrel (PLAVIX) 75 MG tablet, Take 1 tablet (75 mg total) by mouth daily., Disp: 90 tablet, Rfl: 1 .  DEXILANT 60 MG capsule, TAKE 1 CAPSULE BY MOUTH DAILY, Disp: 30 capsule, Rfl: 11 .  fluticasone (FLONASE) 50 MCG/ACT nasal spray, SHAKE LQ AND U 2 SPRAYS IEN D, Disp: , Rfl: 0 .  gabapentin (NEURONTIN) 300 MG capsule, Take 300 mg by mouth. ONE OR  TWO AT NIGHT, Disp: , Rfl:  .  glucose blood test strip, Use as instructed, Disp: 100 each, Rfl: 12 .  ketoconazole (NIZORAL) 2 % cream, Apply 1 application topically daily., Disp: 30 g, Rfl: 0 .  LANTUS SOLOSTAR 100 UNIT/ML Solostar Pen, ADMINISTER 80 UNITS UNDER THE SKIN DAILY AT 10 PM, Disp: 15 mL, Rfl: 0 .  levothyroxine (SYNTHROID, LEVOTHROID) 50 MCG tablet, Take 1 tablet (50 mcg total) by mouth daily before breakfast., Disp: 90 tablet, Rfl: 1 .  losartan-hydrochlorothiazide (HYZAAR) 50-12.5 MG tablet, Take 1 tablet by mouth daily., Disp: 90 tablet, Rfl: 1 .  metformin (FORTAMET) 500 MG (OSM) 24 hr tablet, Take 1 tablet (500 mg total) by mouth 2 (two) times daily with a meal., Disp: 180 tablet, Rfl: 1 .  ONETOUCH DELICA LANCETS 54Y MISC, 33 g by Does not apply route 2 (two) times daily. Test twice a day as directed, Disp: 100 each, Rfl: 2 .  Vitamin D,  Ergocalciferol, (DRISDOL) 50000 units CAPS capsule, Take 1 capsule (50,000 Units total) by mouth once a week. For 12 weeks, Disp: 12 capsule, Rfl: 0  Current facility-administered medications:  .  triamcinolone acetonide (KENALOG) 10 MG/ML injection 10 mg, 10 mg, Other, Once, Landis Martins, DPM  Allergies  Allergen Reactions  . Aspirin   . Codeine Nausea And Vomiting    RASH  . Duloxetine Hcl   . Lyrica [Pregabalin] Nausea And Vomiting  . Penicillins   . Sulfa Antibiotics Swelling     Review of Systems  Constitutional: Positive for fatigue.  Gastrointestinal: Negative for nausea, vomiting, abdominal pain, diarrhea and constipation.  Endo/Heme/Allergies: Positive for polydipsia.     Objective  Filed Vitals:   11/05/15 1024  BP: 112/70  Pulse: 96  Temp: 98.1 F (36.7 C)  TempSrc: Oral  Resp: 17  Height: 5' 4"  (1.626 m)  Weight: 175 lb 11.2 oz (79.697 kg)  SpO2: 96%    Physical Exam  Constitutional: She is well-developed, well-nourished, and in no distress.  Cardiovascular: Normal rate and regular rhythm.   Pulmonary/Chest: Effort normal and breath sounds normal.  Abdominal: Soft. Bowel sounds are normal. She exhibits mass. There is no tenderness.    In the left lower quadrant area, there is a cystic palpable mass, non tender to palpation, no surrounding skin changes.  Nursing note and vitals reviewed.      Assessment & Plan  1. Vitamin B12 deficiency Last B12 level was within normal range in November 2016. Repeat tap and follow-up. - B12  2. Uncontrolled type 2 diabetes mellitus with peripheral neuropathy (HCC) A1c is still above goal, today it is 7.7%, worse from 7.5% from 3 months ago. We will add DPP 4 inhibitor to her diabetic regimen. Continue on metformin at the same dosage and Lantus at the current units. Advise to be physically active as much as possible. - linagliptin (TRADJENTA) 5 MG TABS tablet; Take 1 tablet (5 mg total) by mouth daily.   Dispense: 30 tablet; Refill: 2 - Insulin Glargine (LANTUS SOLOSTAR) 100 UNIT/ML Solostar Pen; Inject 80 Units into the skin daily at 10 pm.  Dispense: 15 mL; Refill: 2  3. Abdominal mass, left lower quadrant We'll obtain an ultrasound for evaluation of this abdominal mass. - US Abdomen Limited; Future  Janathan Bribiesca [facility]ad [facility] 11/05/2015 10:36 AM

## 2015-11-06 ENCOUNTER — Telehealth: Payer: Self-pay | Admitting: Family Medicine

## 2015-11-06 LAB — VITAMIN B12: Vitamin B-12: 416 pg/mL (ref 211–946)

## 2015-11-06 NOTE — Telephone Encounter (Signed)
Charlotte Herrera from [facility] states patient called wanting an appointment with them. States that she has found a knot in her stomach. [facility] is requesting for [place] to fax over notes along with a referral (F) [phone]

## 2015-11-07 ENCOUNTER — Telehealth: Payer: Self-pay | Admitting: Gastroenterology

## 2015-11-07 NOTE — Telephone Encounter (Signed)
Dr. Manuella Ghazi please place referral and I will fax notes to [facility]

## 2015-11-07 NOTE — Telephone Encounter (Signed)
Opened chart in error please disregard

## 2015-11-07 NOTE — Telephone Encounter (Signed)
Patient was evaluated and ordered an ultrasound of left lower quadrant to find out the nature of the suspected mass in the left lower abdomen. However, if she wants to skip the ultrasound and go straight to Gen. surgery we can place a referral. Please confirm

## 2015-11-07 NOTE — Telephone Encounter (Signed)
Patient called and wanted to get a second opinion on a lump in her stomach next to her belly button. Called Dr. Trena Platt office at [facility] and asked for notes. Referral coordinator stated that patient needs to go get her ultra sound that was ordered and then Dr. Manuella Ghazi will determine who she needs to see. Amber agreed with this. We will wait to see what the ultrasound shows and Dr. Trena Platt office will contact us. I explained this to the patient and she understood.

## 2015-11-12 ENCOUNTER — Ambulatory Visit
Admission: RE | Admit: 2015-11-12 | Discharge: 2015-11-12 | Disposition: A | Discharge status: Home / Self Care | Payer: PPO | Source: Ambulatory Visit | Attending: Family Medicine | Admitting: Family Medicine

## 2015-11-12 DIAGNOSIS — R1904 Left lower quadrant abdominal swelling, mass and lump: Secondary | ICD-10-CM | POA: Diagnosis not present

## 2015-11-12 DIAGNOSIS — R1900 Intra-abdominal and pelvic swelling, mass and lump, unspecified site: Secondary | ICD-10-CM | POA: Diagnosis not present

## 2015-11-14 ENCOUNTER — Encounter: Payer: Self-pay | Admitting: Family Medicine

## 2015-11-14 ENCOUNTER — Telehealth: Payer: Self-pay | Admitting: Family Medicine

## 2015-11-14 DIAGNOSIS — F411 Generalized anxiety disorder: Secondary | ICD-10-CM

## 2015-11-14 MED ORDER — ALPRAZOLAM 0.5 MG PO TABS: 0.5000 mg | ORAL_TABLET | Freq: Two times a day (BID) | ORAL | Status: DC | PRN

## 2015-11-14 NOTE — Telephone Encounter (Signed)
Prescription for Alprazolam is ready for patient pick up and she has been notified

## 2015-11-14 NOTE — Telephone Encounter (Signed)
Pt would like a refill on her Alprazolam. Pt will be out tomorrow.

## 2015-11-28 ENCOUNTER — Other Ambulatory Visit: Payer: Self-pay | Admitting: Family Medicine

## 2015-11-29 ENCOUNTER — Telehealth: Payer: Self-pay | Admitting: Family Medicine

## 2015-11-29 NOTE — Telephone Encounter (Signed)
Called patient back on the number listed and after at least 12-15 rings, no one answered. No option to leave a voice message. Prior to today, I have not received any message regarding possible side effects associated with Tradjenta (reviewed  the "encounters tab" in the chart review section). Please schedule patient for an appointment next week to discuss ordering an MRI and to discuss switching to a different agent for diabetes.

## 2015-11-29 NOTE — Telephone Encounter (Signed)
Patient is also waiting on an order to have a MRI per the ultrasound result note.

## 2015-11-29 NOTE — Telephone Encounter (Signed)
Patient states she was told on Tuesday by nurse that they would have you to return patient call and she has not heard anything. She states that she was prescribed Tradjenta a few weeks back and has had: joint aches, severe headaches, and breathing problems. When I informed her that I did not see the message, that I would have to place a message because Dr Manuella Ghazi is currently out of the office. Her reply was, "this happens all the time that she never receive return calls, that she will just have to find another doctor who can keep good notes and a doctor who actually gives a ______." Patient then hung up.

## 2015-12-02 NOTE — Telephone Encounter (Signed)
Patient called and made appointment for this coming Thursday. She then stated that she has been waiting for a return call but Dr Manuella Ghazi has never called her that her eyes will be swollen shut before he ever give her a call. I did inform patient that Dr Manuella Ghazi called her on 11-29-15 but was not able to leave a voice message. She then stated that you must have called her home phone and not her cell. I verified her cell [phone] which is the best number to reach her

## 2015-12-03 NOTE — Telephone Encounter (Signed)
Patient reports that Tradjenta, which was prescribed to her for diabetes caused itchy and swollen eyes, sore and itchy throat and consequently she stopped taking it. After discontinuation, her symptoms  resolved. Recommended to continue to take Lantus and metformin for now and we'll recheck A1c next month. Her stress level is also improving, which will lower her sugars as well.

## 2015-12-04 NOTE — Telephone Encounter (Signed)
I read your message below. I would like clarification. She has appointment for tomorrow 12-05-15 with you. Would you like for me to cancel this appointment?

## 2015-12-04 NOTE — Telephone Encounter (Signed)
Patient informed to keep appointment for 12-05-15

## 2015-12-04 NOTE — Telephone Encounter (Signed)
Patient should keep her appointment to discuss her symptoms in detail and to consider referring to general surgery for a lipoma in her abdominal wall.

## 2015-12-05 ENCOUNTER — Ambulatory Visit (INDEPENDENT_AMBULATORY_CARE_PROVIDER_SITE_OTHER): Payer: PPO | Admitting: Family Medicine

## 2015-12-05 ENCOUNTER — Encounter: Payer: Self-pay | Admitting: Family Medicine

## 2015-12-05 VITALS — BP 112/63 | HR 99 | Temp 98.0°F | Resp 18 | Ht 64.0 in | Wt 173.1 lb

## 2015-12-05 DIAGNOSIS — B372 Candidiasis of skin and nail: Secondary | ICD-10-CM

## 2015-12-05 DIAGNOSIS — R82998 Other abnormal findings in urine: Secondary | ICD-10-CM

## 2015-12-05 DIAGNOSIS — J3089 Other allergic rhinitis: Secondary | ICD-10-CM | POA: Insufficient documentation

## 2015-12-05 DIAGNOSIS — D171 Benign lipomatous neoplasm of skin and subcutaneous tissue of trunk: Secondary | ICD-10-CM | POA: Diagnosis not present

## 2015-12-05 DIAGNOSIS — R748 Abnormal levels of other serum enzymes: Secondary | ICD-10-CM | POA: Diagnosis not present

## 2015-12-05 DIAGNOSIS — R8299 Other abnormal findings in urine: Secondary | ICD-10-CM

## 2015-12-05 LAB — POCT URINALYSIS DIPSTICK
Bilirubin, UA: NEGATIVE
Blood, UA: NEGATIVE
Glucose, UA: NEGATIVE
Ketones, UA: NEGATIVE
Leukocytes, UA: NEGATIVE
Nitrite, UA: POSITIVE
Protein, UA: NEGATIVE
Spec Grav, UA: 1.01
Urobilinogen, UA: 0.2
pH, UA: 5

## 2015-12-05 MED ORDER — KETOCONAZOLE 2 % EX CREA: 1.0000 "application " | TOPICAL_CREAM | Freq: Every day | CUTANEOUS | Status: DC

## 2015-12-05 MED ORDER — FLUTICASONE PROPIONATE 50 MCG/ACT NA SUSP: 2.0000 | Freq: Every day | NASAL | Status: DC

## 2015-12-05 NOTE — Progress Notes (Signed)
Name: Charlotte Herrera   MRN: [id number]    DOB: 12-16-1945   Date:12/05/2015       Progress Note  Subjective  Chief Complaint  Chief Complaint  Patient presents with  . Medication Refill    flonase     HPI  Seasonal Allergic Rhinitis:  Pt. Takes Flonase for environmental and  seasonal allergies, symptoms include nasal congestion, runny nose, itchy eyes, and sore throat. She also takes Cetirizine for relief.  Abdominal Wall Lipoma:  Pt. Has a palpable mass on her abdominal wall, can be felt in certain positions. An Ultrasound of abdomen was obtained last month which showed a probable abdominal wall lipoma.  The mass does hurt occasionally.  Elevated Liver Enzymes: Elevated liver enzymes, AST is 98 and ALT is 68, patient has diffuse fatty liver disease.  No abdominal pain, nausea, or vomiting.     Past Medical History  Diagnosis Date  . Enlarged liver   . Diabetes mellitus without complication ([place])   . Hypertension   . Hyperlipidemia   . GERD (gastroesophageal reflux disease)   . Thyroid disease   . Stroke ([place])   . Depression   . Fibromyalgia affecting multiple sites     Past Surgical History  Procedure Laterality Date  . Varicose vein surgery Left 1975  . Abdominal hysterectomy    . Spine surgery    . Carpal tunnel release Left     Family History  Problem Relation Age of Onset  . Cancer Mother   . Breast cancer Mother     67's  . Diabetes Father   . Hearing loss Father   . Cancer Father   . Diabetes Brother   . Cancer Brother   . Breast cancer Maternal Aunt     40's    Social History   Social History  . Marital Status: Divorced    Spouse Name: N/A  . Number of Children: N/A  . Years of Education: N/A   Occupational History  . Not on file.   Social History Main Topics  . Smoking status: Never Smoker   . Smokeless tobacco: Never Used  . Alcohol Use: No  . Drug Use: No  . Sexual Activity: Not on file   Other Topics Concern  . Not on file    Social History Narrative     Current outpatient prescriptions:  .  allopurinol (ZYLOPRIM) 100 MG tablet, Take 1 tablet (100 mg total) by mouth 2 (two) times daily., Disp: 180 tablet, Rfl: 0 .  ALPRAZolam (XANAX) 0.5 MG tablet, Take 1 tablet (0.5 mg total) by mouth 2 (two) times daily as needed. for anxiety, Disp: 60 tablet, Rfl: 1 .  B-D UF III MINI PEN NEEDLES 31G X 5 MM MISC, use as directed at bedtime, Disp: 100 each, Rfl: 2 .  Blood Glucose Monitoring Suppl (ONE TOUCH ULTRA MINI) W/DEVICE KIT, 1 Device by Does not apply route 2 (two) times daily., Disp: 1 each, Rfl: 0 .  cetirizine (ZYRTEC) 10 MG tablet, Take 10 mg by mouth daily., Disp: , Rfl:  .  cloNIDine (CATAPRES) 0.1 MG tablet, Take 1 tablet (0.1 mg total) by mouth 2 (two) times daily., Disp: 180 tablet, Rfl: 2 .  clopidogrel (PLAVIX) 75 MG tablet, Take 1 tablet (75 mg total) by mouth daily., Disp: 90 tablet, Rfl: 1 .  DEXILANT 60 MG capsule, TAKE 1 CAPSULE BY MOUTH DAILY, Disp: 30 capsule, Rfl: 11 .  fluticasone (FLONASE) 50 MCG/ACT nasal spray, SHAKE LQ AND  U 2 SPRAYS IEN D, Disp: , Rfl: 0 .  gabapentin (NEURONTIN) 300 MG capsule, Take 300 mg by mouth. ONE OR TWO AT NIGHT, Disp: , Rfl:  .  glucose blood test strip, Use as instructed, Disp: 100 each, Rfl: 12 .  Insulin Glargine (LANTUS SOLOSTAR) 100 UNIT/ML Solostar Pen, Inject 80 Units into the skin daily at 10 pm., Disp: 15 mL, Rfl: 2 .  ketoconazole (NIZORAL) 2 % cream, Apply 1 application topically daily., Disp: 30 g, Rfl: 0 .  levothyroxine (SYNTHROID, LEVOTHROID) 50 MCG tablet, Take 1 tablet (50 mcg total) by mouth daily before breakfast., Disp: 90 tablet, Rfl: 1 .  linagliptin (TRADJENTA) 5 MG TABS tablet, Take 1 tablet (5 mg total) by mouth daily., Disp: 30 tablet, Rfl: 2 .  losartan-hydrochlorothiazide (HYZAAR) 50-12.5 MG tablet, Take 1 tablet by mouth daily., Disp: 90 tablet, Rfl: 1 .  metformin (FORTAMET) 500 MG (OSM) 24 hr tablet, Take 1 tablet (500 mg total) by mouth  2 (two) times daily with a meal., Disp: 180 tablet, Rfl: 1 .  ONETOUCH DELICA LANCETS 38G MISC, 33 g by Does not apply route 2 (two) times daily. Test twice a day as directed, Disp: 100 each, Rfl: 2 .  Vitamin D, Ergocalciferol, (DRISDOL) 50000 units CAPS capsule, Take 1 capsule (50,000 Units total) by mouth once a week. For 12 weeks, Disp: 12 capsule, Rfl: 0  Current facility-administered medications:  .  triamcinolone acetonide (KENALOG) 10 MG/ML injection 10 mg, 10 mg, Other, Once, Landis Martins, DPM  Allergies  Allergen Reactions  . Aspirin   . Codeine Nausea And Vomiting    RASH  . Duloxetine Hcl   . Lyrica [Pregabalin] Nausea And Vomiting  . Penicillins   . Sulfa Antibiotics Swelling     Review of Systems  HENT: Positive for congestion and sore throat.   Gastrointestinal: Negative for nausea, vomiting and abdominal pain.  Skin: Positive for itching.    Objective  Filed Vitals:   12/05/15 1514  BP: 112/63  Pulse: 99  Temp: 98 F (36.7 C)  TempSrc: Oral  Resp: 18  Height: 5' 4" (1.626 m)  Weight: 173 lb 1.6 oz (78.518 kg)  SpO2: 97%    Physical Exam  Constitutional: She is oriented to person, place, and time and well-developed, well-nourished, and in no distress.  Cardiovascular: Normal rate and regular rhythm.   No murmur heard. Pulmonary/Chest: Effort normal and breath sounds normal. She has no wheezes.  Abdominal: Soft. Bowel sounds are normal. There is no tenderness.  Palpable non-tender mass in the abdominal wall just inferior and lateral to the umbilicus.   Neurological: She is alert and oriented to person, place, and time.  Nursing note and vitals reviewed.    Assessment & Plan  1. Environmental and seasonal allergies  - fluticasone (FLONASE) 50 MCG/ACT nasal spray; Place 2 sprays into both nostrils daily.  Dispense: 16 g; Refill: 2  2. Lipoma of abdominal wall We'll refer to Gen. Surgery to discuss possible excision - Ambulatory referral to  General Surgery  3. Abnormal liver enzymes  - Comprehensive Metabolic Panel (CMET)  4. Candidal intertrigo She occasionally gets a rash underneath her breasts, worse with heat and humid weather. Requesting refill for ketoconazole cream to be applied to the affected area. - ketoconazole (NIZORAL) 2 % cream; Apply 1 application topically daily.  Dispense: 60 g; Refill: 0  5. Dark urine Shouldn't has noticed dark-colored urine in the past few days. We'll obtain urinalysis and follow-up. -  POCT Urinalysis Dipstick  Eulah Walkup [facility] 12/05/2015 3:17 PM

## 2015-12-06 LAB — COMPREHENSIVE METABOLIC PANEL
ALT: 97 IU/L — ABNORMAL HIGH (ref 0–32)
AST: 167 IU/L — ABNORMAL HIGH (ref 0–40)
Albumin/Globulin Ratio: 1.2 (ref 1.2–2.2)
Albumin: 4.2 g/dL (ref 3.5–4.8)
Alkaline Phosphatase: 63 IU/L (ref 39–117)
BUN/Creatinine Ratio: 20 (ref 12–28)
BUN: 19 mg/dL (ref 8–27)
Bilirubin Total: 0.4 mg/dL (ref 0.0–1.2)
CO2: 22 mmol/L (ref 18–29)
Calcium: 9.7 mg/dL (ref 8.7–10.3)
Chloride: 99 mmol/L (ref 96–106)
Creatinine, Ser: 0.94 mg/dL (ref 0.57–1.00)
GFR calc Af Amer: 71 mL/min/{1.73_m2} (ref >59)
GFR calc non Af Amer: 62 mL/min/{1.73_m2} (ref >59)
Globulin, Total: 3.5 g/dL (ref 1.5–4.5)
Glucose: 214 mg/dL — ABNORMAL HIGH (ref 65–99)
Potassium: 4.8 mmol/L (ref 3.5–5.2)
Sodium: 141 mmol/L (ref 134–144)
Total Protein: 7.7 g/dL (ref 6.0–8.5)

## 2015-12-09 ENCOUNTER — Other Ambulatory Visit: Payer: Self-pay

## 2015-12-11 DIAGNOSIS — M9903 Segmental and somatic dysfunction of lumbar region: Secondary | ICD-10-CM | POA: Diagnosis not present

## 2015-12-11 DIAGNOSIS — M5136 Other intervertebral disc degeneration, lumbar region: Secondary | ICD-10-CM | POA: Diagnosis not present

## 2015-12-11 DIAGNOSIS — M9905 Segmental and somatic dysfunction of pelvic region: Secondary | ICD-10-CM | POA: Diagnosis not present

## 2015-12-11 DIAGNOSIS — M5431 Sciatica, right side: Secondary | ICD-10-CM | POA: Diagnosis not present

## 2015-12-16 ENCOUNTER — Encounter: Payer: Self-pay | Admitting: General Surgery

## 2015-12-16 ENCOUNTER — Telehealth: Payer: Self-pay | Admitting: Family Medicine

## 2015-12-16 NOTE — Telephone Encounter (Signed)
Please inform patient of her lab results based on the notes that I made regarding pertinent labs

## 2015-12-16 NOTE — Telephone Encounter (Signed)
Pt states she is waiting to hear about lab results.

## 2015-12-16 NOTE — Addendum Note (Signed)
Addended byManuella Ghazi, Waldron Gerry A A on: 12/16/2015 05:21 PM   Modules accepted: Orders

## 2015-12-25 ENCOUNTER — Encounter: Payer: Self-pay | Admitting: *Deleted

## 2015-12-25 ENCOUNTER — Other Ambulatory Visit: Payer: Self-pay | Admitting: Family Medicine

## 2015-12-27 NOTE — Telephone Encounter (Signed)
Left voicemail notifying patient that she has completed her 12 week therapy of vitamin D and her levels will be rechecked at next office visit to determine if she needs another therapy or not

## 2015-12-30 ENCOUNTER — Ambulatory Visit: Payer: PPO | Admitting: Family Medicine

## 2016-01-06 ENCOUNTER — Encounter: Payer: Self-pay | Admitting: General Surgery

## 2016-01-06 ENCOUNTER — Ambulatory Visit (INDEPENDENT_AMBULATORY_CARE_PROVIDER_SITE_OTHER): Payer: PPO | Admitting: General Surgery

## 2016-01-06 VITALS — BP 128/76 | HR 80 | Resp 12 | Ht 63.0 in | Wt 173.0 lb

## 2016-01-06 DIAGNOSIS — D171 Benign lipomatous neoplasm of skin and subcutaneous tissue of trunk: Secondary | ICD-10-CM

## 2016-01-06 NOTE — Patient Instructions (Addendum)
Follow up as needed. If there is an increase in size or it becomes painful patient will contact office.

## 2016-01-06 NOTE — Progress Notes (Signed)
Patient ID: Charlotte Herrera, female   DOB: 04-01-1946, 70 y.o.   MRN: [id number]  Chief Complaint  Patient presents with  . Other    Lipoma    HPI Charlotte Herrera is a 70 y.o. female here today for an evaluation of an abdominal lipoma. Patient states she noticed a lump about a month ago while giving her insulin shot. The lump is located near her navel. She states the lump is painful only when it is pressed on. No redness or draining. She had an Ultrasound done on 11/12/15. I have reviewed the history of present illness with the patient.  HPI  Past Medical History  Diagnosis Date  . Enlarged liver   . Diabetes mellitus without complication ([place])   . Hypertension   . Hyperlipidemia   . GERD (gastroesophageal reflux disease)   . Thyroid disease   . Stroke ([place])   . Depression   . Fibromyalgia affecting multiple sites     Past Surgical History  Procedure Laterality Date  . Varicose vein surgery Left 1975  . Abdominal hysterectomy    . Spine surgery    . Carpal tunnel release Left   . Colonoscopy  2015    Family History  Problem Relation Age of Onset  . Cancer Mother   . Breast cancer Mother     10's  . Diabetes Father   . Hearing loss Father   . Cancer Father   . Diabetes Brother   . Cancer Brother   . Breast cancer Maternal Aunt     40's    Social History Social History  Substance Use Topics  . Smoking status: Never Smoker   . Smokeless tobacco: Never Used  . Alcohol Use: No    Allergies  Allergen Reactions  . Aspirin   . Codeine Nausea And Vomiting    RASH  . Duloxetine Hcl   . Lyrica [Pregabalin] Nausea And Vomiting  . Penicillins   . Sulfa Antibiotics Swelling    Current Outpatient Prescriptions  Medication Sig Dispense Refill  . allopurinol (ZYLOPRIM) 100 MG tablet Take 1 tablet (100 mg total) by mouth 2 (two) times daily. 180 tablet 0  . ALPRAZolam (XANAX) 0.5 MG tablet Take 1 tablet (0.5 mg total) by mouth 2 (two) times daily as needed. for anxiety 60  tablet 1  . B-D UF III MINI PEN NEEDLES 31G X 5 MM MISC use as directed at bedtime 100 each 2  . Blood Glucose Monitoring Suppl (ONE TOUCH ULTRA MINI) W/DEVICE KIT 1 Device by Does not apply route 2 (two) times daily. 1 each 0  . cetirizine (ZYRTEC) 10 MG tablet Take 10 mg by mouth daily.    . cloNIDine (CATAPRES) 0.1 MG tablet Take 1 tablet (0.1 mg total) by mouth 2 (two) times daily. (Patient taking differently: Take 0.1 mg by mouth daily. ) 180 tablet 2  . clopidogrel (PLAVIX) 75 MG tablet Take 1 tablet (75 mg total) by mouth daily. 90 tablet 1  . DEXILANT 60 MG capsule TAKE 1 CAPSULE BY MOUTH DAILY 30 capsule 11  . fluticasone (FLONASE) 50 MCG/ACT nasal spray Place 2 sprays into both nostrils daily. 16 g 2  . gabapentin (NEURONTIN) 300 MG capsule Take 300 mg by mouth. ONE OR TWO AT NIGHT    . glucose blood test strip Use as instructed 100 each 12  . ketoconazole (NIZORAL) 2 % cream Apply 1 application topically daily. 60 g 0  . levothyroxine (SYNTHROID, LEVOTHROID) 50 MCG tablet  Take 1 tablet (50 mcg total) by mouth daily before breakfast. 90 tablet 1  . losartan-hydrochlorothiazide (HYZAAR) 50-12.5 MG tablet Take 1 tablet by mouth daily. 90 tablet 1  . metformin (FORTAMET) 500 MG (OSM) 24 hr tablet Take 1 tablet (500 mg total) by mouth 2 (two) times daily with a meal. 180 tablet 1  . ONETOUCH DELICA LANCETS 61Y MISC 33 g by Does not apply route 2 (two) times daily. Test twice a day as directed 100 each 2  . LANTUS SOLOSTAR 100 UNIT/ML Solostar Pen ADMINISTER 80 UNITS UNDER THE SKIN DAILY AT 10 PM 15 mL 0  . Vitamin D, Ergocalciferol, (DRISDOL) 50000 units CAPS capsule Take 1 capsule (50,000 Units total) by mouth once a week. For 12 weeks (Patient not taking: Reported on 01/06/2016) 12 capsule 0   Current Facility-Administered Medications  Medication Dose Route Frequency Provider Last Rate Last Dose  . triamcinolone acetonide (KENALOG) 10 MG/ML injection 10 mg  10 mg Other Once Landis Martins,  DPM        Review of Systems Review of Systems  Constitutional: Negative.   Respiratory: Negative.   Cardiovascular: Negative.     Blood pressure 128/76, pulse 80, resp. rate 12, height 5' 3"  (1.6 m), weight 173 lb (78.472 kg).  Physical Exam Physical Exam  Constitutional: She is oriented to person, place, and time. She appears well-developed and well-nourished.  Eyes: Conjunctivae are normal. No scleral icterus.  Neck: No thyromegaly present.  Abdominal: Soft. Bowel sounds are normal.    Lymphadenopathy:    She has no cervical adenopathy.  Neurological: She is alert and oriented to person, place, and time.  Skin: Skin is warm and dry.    Data Reviewed Notes and Ultrasound reviewed.  Assessment    Abdominal wall Lipoma. Very small and minimally symptomatic. No need for excision at present    Plan    Follow up as needed. If there is an increase in size or it becomes painful patient will contact office.       This information has been scribed by Verlene Mayer, CMA    PCp: Dr. Irwin Brakeman 01/09/2016, 2:35 PM

## 2016-01-07 ENCOUNTER — Other Ambulatory Visit: Payer: Self-pay | Admitting: Family Medicine

## 2016-01-09 ENCOUNTER — Encounter: Payer: Self-pay | Admitting: General Surgery

## 2016-01-14 ENCOUNTER — Ambulatory Visit: Payer: PPO | Admitting: Family Medicine

## 2016-01-14 ENCOUNTER — Other Ambulatory Visit: Payer: Self-pay | Admitting: Family Medicine

## 2016-01-14 ENCOUNTER — Ambulatory Visit: Payer: PPO | Admitting: General Surgery

## 2016-01-15 ENCOUNTER — Encounter: Payer: Self-pay | Admitting: Gastroenterology

## 2016-01-15 ENCOUNTER — Ambulatory Visit (INDEPENDENT_AMBULATORY_CARE_PROVIDER_SITE_OTHER): Payer: PPO | Admitting: Gastroenterology

## 2016-01-15 VITALS — BP 107/62 | HR 75 | Temp 97.6°F | Ht 64.0 in | Wt 173.5 lb

## 2016-01-15 DIAGNOSIS — R748 Abnormal levels of other serum enzymes: Secondary | ICD-10-CM

## 2016-01-15 NOTE — Progress Notes (Signed)
Primary Care Physician: Keith Rake, MD  Primary Gastroenterologist:  Dr. Lucilla Lame  Chief Complaint  Patient presents with  . Elevated Hepatic Enzymes    HPI: Charlotte Herrera is a 70 y.o. female here For chronically elevated liver enzymes. The patient has had an increased AST over ALT. The patient denies any alcohol abuse at present or in the past. The patient has never had a workup for her abnormal liver enzymes. The patient is now here because of the findings on her blood work. The patient's most recent liver enzymes showed her AST of 167 with her ALT of 97. The patient's bilirubin and alkaline phosphatase were normal. The patient denies any new medications. She did have an ultrasound that showed fatty liver in the past.  Current Outpatient Prescriptions  Medication Sig Dispense Refill  . allopurinol (ZYLOPRIM) 100 MG tablet Take 1 tablet (100 mg total) by mouth 2 (two) times daily. 180 tablet 0  . ALPRAZolam (XANAX) 0.5 MG tablet Take 1 tablet (0.5 mg total) by mouth 2 (two) times daily as needed. for anxiety 60 tablet 1  . B-D UF III MINI PEN NEEDLES 31G X 5 MM MISC use as directed at bedtime 100 each 2  . Blood Glucose Monitoring Suppl (ONE TOUCH ULTRA MINI) W/DEVICE KIT 1 Device by Does not apply route 2 (two) times daily. 1 each 0  . cetirizine (ZYRTEC) 10 MG tablet Take 10 mg by mouth daily.    . cloNIDine (CATAPRES) 0.1 MG tablet Take 1 tablet (0.1 mg total) by mouth 2 (two) times daily. (Patient taking differently: Take 0.1 mg by mouth daily. ) 180 tablet 2  . clopidogrel (PLAVIX) 75 MG tablet Take 1 tablet (75 mg total) by mouth daily. 90 tablet 1  . DEXILANT 60 MG capsule TAKE 1 CAPSULE BY MOUTH DAILY 30 capsule 11  . fluticasone (FLONASE) 50 MCG/ACT nasal spray Place 2 sprays into both nostrils daily. 16 g 2  . gabapentin (NEURONTIN) 300 MG capsule Take 300 mg by mouth. ONE OR TWO AT NIGHT    . glucose blood test strip Use as instructed 100 each 12  . ketoconazole (NIZORAL) 2 %  cream Apply 1 application topically daily. 60 g 0  . LANTUS SOLOSTAR 100 UNIT/ML Solostar Pen ADMINISTER 80 UNITS UNDER THE SKIN DAILY AT 10 PM 15 mL 0  . levothyroxine (SYNTHROID, LEVOTHROID) 50 MCG tablet Take 1 tablet (50 mcg total) by mouth daily before breakfast. 90 tablet 1  . losartan-hydrochlorothiazide (HYZAAR) 50-12.5 MG tablet Take 1 tablet by mouth daily. 90 tablet 1  . metformin (FORTAMET) 500 MG (OSM) 24 hr tablet Take 1 tablet (500 mg total) by mouth 2 (two) times daily with a meal. 180 tablet 1  . ONETOUCH DELICA LANCETS 62V MISC 33 g by Does not apply route 2 (two) times daily. Test twice a day as directed 100 each 2  . Vitamin D, Ergocalciferol, (DRISDOL) 50000 units CAPS capsule Take 1 capsule (50,000 Units total) by mouth once a week. For 12 weeks 12 capsule 0   Current Facility-Administered Medications  Medication Dose Route Frequency Provider Last Rate Last Dose  . triamcinolone acetonide (KENALOG) 10 MG/ML injection 10 mg  10 mg Other Once Landis Martins, DPM        Allergies as of 01/15/2016 - Review Complete 01/15/2016  Allergen Reaction Noted  . Aspirin  12/25/2014  . Codeine Nausea And Vomiting 04/11/2013  . Duloxetine hcl  12/25/2014  . Lyrica [pregabalin] Nausea And Vomiting  04/11/2013  . Penicillins  12/25/2014  . Sulfa antibiotics Swelling 04/11/2013    ROS:  General: Negative for anorexia, weight loss, fever, chills, fatigue, weakness. ENT: Negative for hoarseness, difficulty swallowing , nasal congestion. CV: Negative for chest pain, angina, palpitations, dyspnea on exertion, peripheral edema.  Respiratory: Negative for dyspnea at rest, dyspnea on exertion, cough, sputum, wheezing.  GI: See history of present illness. GU:  Negative for dysuria, hematuria, urinary incontinence, urinary frequency, nocturnal urination.  Endo: Negative for unusual weight change.    Physical Examination:   BP 107/62 mmHg  Pulse 75  Temp(Src) 97.6 F (36.4 C) (Oral)   Ht 5' 4"  (1.626 m)  Wt 173 lb 8 oz (78.699 kg)  BMI 29.77 kg/m2  General: Well-nourished, well-developed in no acute distress.  Eyes: No icterus. Conjunctivae pink. Mouth: Oropharyngeal mucosa moist and pink , no lesions erythema or exudate. Lungs: Clear to auscultation bilaterally. Non-labored. Heart: Regular rate and rhythm, no murmurs rubs or gallops.  Abdomen: Bowel sounds are normal, nontender, nondistended, no hepatosplenomegaly or masses, no abdominal bruits or hernia , no rebound or guarding.   Extremities: No lower extremity edema. No clubbing or deformities. Neuro: Alert and oriented x 3.  Grossly intact. Skin: Warm and dry, no jaundice.   Psych: Alert and cooperative, normal mood and affect.  Labs:    Imaging Studies: No results found.  Assessment and Plan:   Charlotte Herrera is a 70 y.o. y/o female who has a history of chronically elevated liver enzymes with the most recent liver enzymes being significantly higher than the past. The patient's AST is higher than ALT. The patient will have her labs sent off for possible causes of her abnormal liver enzymes. The patient may have the elevated liver enzymes from fatty liver. The patient will be contacted with the results of her labs and has been told to try and lose weight with exercise. The patient has been explained the plan and agrees with it.   Note: This dictation was prepared with Dragon dictation along with smaller phrase technology. Any transcriptional errors that result from this process are unintentional.

## 2016-01-16 LAB — IRON AND TIBC
Iron Saturation: 28 % (ref 15–55)
Iron: 121 ug/dL (ref 27–139)
Total Iron Binding Capacity: 436 ug/dL (ref 250–450)
UIBC: 315 ug/dL (ref 118–369)

## 2016-01-16 LAB — HEPATITIS B SURFACE ANTIBODY,QUALITATIVE: Hep B Surface Ab, Qual: NONREACTIVE

## 2016-01-16 LAB — HEPATIC FUNCTION PANEL
ALT: 78 IU/L — ABNORMAL HIGH (ref 0–32)
AST: 142 IU/L — ABNORMAL HIGH (ref 0–40)
Albumin: 4.2 g/dL (ref 3.5–4.8)
Alkaline Phosphatase: 69 IU/L (ref 39–117)
Bilirubin Total: 0.4 mg/dL (ref 0.0–1.2)
Bilirubin, Direct: 0.15 mg/dL (ref 0.00–0.40)
Total Protein: 7.5 g/dL (ref 6.0–8.5)

## 2016-01-16 LAB — ANTI-SMOOTH MUSCLE ANTIBODY, IGG: Smooth Muscle Ab: 14 Units (ref 0–19)

## 2016-01-16 LAB — CERULOPLASMIN: Ceruloplasmin: 31.6 mg/dL (ref 19.0–39.0)

## 2016-01-16 LAB — ANA: Anti Nuclear Antibody(ANA): POSITIVE — AB

## 2016-01-16 LAB — MITOCHONDRIAL ANTIBODIES: Mitochondrial Ab: 8.4 Units (ref 0.0–20.0)

## 2016-01-16 LAB — HEPATITIS C ANTIBODY: Hep C Virus Ab: <0.1 s/co ratio (ref 0.0–0.9)

## 2016-01-16 LAB — HEPATITIS B SURFACE ANTIGEN: Hepatitis B Surface Ag: NEGATIVE

## 2016-01-16 LAB — HEPATITIS A ANTIBODY, TOTAL: Hep A Total Ab: NEGATIVE

## 2016-01-16 LAB — FERRITIN: Ferritin: 148 ng/mL (ref 15–150)

## 2016-01-16 LAB — ALPHA-1-ANTITRYPSIN: A-1 Antitrypsin: 113 mg/dL (ref 90–200)

## 2016-01-21 ENCOUNTER — Telehealth: Payer: Self-pay | Admitting: Family Medicine

## 2016-01-21 ENCOUNTER — Encounter: Payer: Self-pay | Admitting: Family Medicine

## 2016-01-21 ENCOUNTER — Telehealth: Payer: Self-pay

## 2016-01-21 ENCOUNTER — Ambulatory Visit (INDEPENDENT_AMBULATORY_CARE_PROVIDER_SITE_OTHER): Payer: PPO | Admitting: Family Medicine

## 2016-01-21 VITALS — BP 112/70 | HR 90 | Temp 98.5°F | Resp 16 | Ht 64.0 in | Wt 174.0 lb

## 2016-01-21 DIAGNOSIS — Z8673 Personal history of transient ischemic attack (TIA), and cerebral infarction without residual deficits: Secondary | ICD-10-CM

## 2016-01-21 DIAGNOSIS — E785 Hyperlipidemia, unspecified: Secondary | ICD-10-CM | POA: Diagnosis not present

## 2016-01-21 DIAGNOSIS — E559 Vitamin D deficiency, unspecified: Secondary | ICD-10-CM | POA: Diagnosis not present

## 2016-01-21 DIAGNOSIS — F411 Generalized anxiety disorder: Secondary | ICD-10-CM

## 2016-01-21 DIAGNOSIS — IMO0002 Reserved for concepts with insufficient information to code with codable children: Secondary | ICD-10-CM

## 2016-01-21 DIAGNOSIS — E1142 Type 2 diabetes mellitus with diabetic polyneuropathy: Secondary | ICD-10-CM | POA: Diagnosis not present

## 2016-01-21 DIAGNOSIS — E1165 Type 2 diabetes mellitus with hyperglycemia: Secondary | ICD-10-CM

## 2016-01-21 LAB — LIPID PANEL
Cholesterol: 203 mg/dL — ABNORMAL HIGH (ref 125–200)
HDL: 40 mg/dL — ABNORMAL LOW (ref >=46)
LDL Cholesterol: 112 mg/dL (ref <130)
Total CHOL/HDL Ratio: 5.1 Ratio — ABNORMAL HIGH (ref <=5.0)
Triglycerides: 253 mg/dL — ABNORMAL HIGH (ref <150)
VLDL: 51 mg/dL — ABNORMAL HIGH (ref <30)

## 2016-01-21 LAB — GLUCOSE, POCT (MANUAL RESULT ENTRY): POC Glucose: 209 mg/dl — AB (ref 70–99)

## 2016-01-21 LAB — POCT GLYCOSYLATED HEMOGLOBIN (HGB A1C): Hemoglobin A1C: 8.8

## 2016-01-21 MED ORDER — ALPRAZOLAM 0.5 MG PO TABS: 0.5000 mg | ORAL_TABLET | Freq: Two times a day (BID) | ORAL | 2 refills | Status: DC | PRN

## 2016-01-21 MED ORDER — CLOPIDOGREL BISULFATE 75 MG PO TABS: 75.0000 mg | ORAL_TABLET | Freq: Every day | ORAL | 1 refills | Status: DC

## 2016-01-21 MED ORDER — GABAPENTIN 300 MG PO CAPS: 300.0000 mg | ORAL_CAPSULE | Freq: Every day | ORAL | 0 refills | Status: DC

## 2016-01-21 MED ORDER — INSULIN DEGLUDEC-LIRAGLUTIDE 100-3.6 UNIT-MG/ML ~~LOC~~ SOPN: 16.0000 [IU] | PEN_INJECTOR | Freq: Every day | SUBCUTANEOUS | 3 refills | Status: DC

## 2016-01-21 NOTE — Telephone Encounter (Signed)
Below is an email message that I received from patient Charlotte Herrera daughter regarding her medication refills.      Good morning,   Sorry just now getting to send this email. I meant to send it at [place] when I arrived at work but got busy.  My mom has an appointment today & needs to get more than 2 months refills. The doctor can put it on file with the drug store & they know that they can't dispense the mediation but every 30 days. It's just ridiculous to have her driving up there to get refills. I haven't had to a written prescription for my anxiety medication in years because the pharmacy calls & gets refills so I know this can be done. It would be her anxiety medicine Zanax & any other meds he's giving her should all have refills on the them. Please feel free to call me at [phone].  Thanks  Charlotte Herrera  Charlotte Herrera's daughter

## 2016-01-21 NOTE — Telephone Encounter (Signed)
LVM for pt to return my call.

## 2016-01-21 NOTE — Telephone Encounter (Signed)
-----   Message from Lucilla Lame, MD sent at 01/20/2016 12:31 PM EDT ----- Let the patient know that her test for hepatitis A and B were negative and she needs a vaccination. The patient's ANA was positive and her liver enzymes remained elevated. The patient should be set up for a liver biopsy to rule out autoimmune hepatitis.

## 2016-01-21 NOTE — Progress Notes (Signed)
Name: Charlotte Herrera   MRN: [id number]    DOB: 26-Dec-1945   Date:01/21/2016       Progress Note  Subjective  Chief Complaint  Chief Complaint  Patient presents with  . Medication Refill  . Diabetes  . Hypertension    Diabetes  She presents for her follow-up diabetic visit. She has type 2 diabetes mellitus. Her disease course has been worsening. Hypoglycemia symptoms include headaches (sinus and allergy related headaches.). Associated symptoms include fatigue, foot paresthesias, polydipsia and polyuria. Pertinent negatives for diabetes include no blurred vision and no chest pain. (Rarely drops too low and pt. experiences hypoglycemic symptoms, especially in early AM.) Diabetic complications include a CVA (history of TIA.) and peripheral neuropathy. Current diabetic treatment includes intensive insulin program and oral agent (monotherapy). She is following a diabetic diet. She rarely participates in exercise. Her breakfast blood glucose range is generally >200 mg/dl. An ACE inhibitor/angiotensin II receptor blocker is being taken. Eye exam is not current.  Hypertension  This is a chronic problem. The problem is controlled. Associated symptoms include headaches (sinus and allergy related headaches.). Pertinent negatives include no blurred vision, chest pain, palpitations or shortness of breath. Hypertensive end-organ damage includes CVA (history of TIA.). There is no history of kidney disease or CAD/MI.    Past Medical History:  Diagnosis Date  . Depression   . Diabetes mellitus without complication ([place])   . Enlarged liver   . Fibromyalgia affecting multiple sites   . GERD (gastroesophageal reflux disease)   . Hyperlipidemia   . Hypertension   . Stroke ([place])   . Thyroid disease     Past Surgical History:  Procedure Laterality Date  . ABDOMINAL HYSTERECTOMY    . CARPAL TUNNEL RELEASE Left   . COLONOSCOPY  2015  . SPINE SURGERY    . VARICOSE VEIN SURGERY Left 1975    Family History   Problem Relation Age of Onset  . Cancer Mother   . Breast cancer Mother     58's  . Diabetes Father   . Hearing loss Father   . Cancer Father   . Diabetes Brother   . Cancer Brother   . Breast cancer Maternal Aunt     40's    Social History   Social History  . Marital status: Divorced    Spouse name: N/A  . Number of children: N/A  . Years of education: N/A   Occupational History  . Not on file.   Social History Main Topics  . Smoking status: Never Smoker  . Smokeless tobacco: Never Used  . Alcohol use No  . Drug use: No  . Sexual activity: Not on file   Other Topics Concern  . Not on file   Social History Narrative  . No narrative on file     Current Outpatient Prescriptions:  .  allopurinol (ZYLOPRIM) 100 MG tablet, Take 1 tablet (100 mg total) by mouth 2 (two) times daily., Disp: 180 tablet, Rfl: 0 .  ALPRAZolam (XANAX) 0.5 MG tablet, Take 1 tablet (0.5 mg total) by mouth 2 (two) times daily as needed. for anxiety, Disp: 60 tablet, Rfl: 1 .  B-D UF III MINI PEN NEEDLES 31G X 5 MM MISC, use as directed at bedtime, Disp: 100 each, Rfl: 2 .  Blood Glucose Monitoring Suppl (ONE TOUCH ULTRA MINI) W/DEVICE KIT, 1 Device by Does not apply route 2 (two) times daily., Disp: 1 each, Rfl: 0 .  cetirizine (ZYRTEC) 10 MG tablet, Take 10  mg by mouth daily., Disp: , Rfl:  .  cloNIDine (CATAPRES) 0.1 MG tablet, Take 1 tablet (0.1 mg total) by mouth 2 (two) times daily. (Patient taking differently: Take 0.1 mg by mouth daily. ), Disp: 180 tablet, Rfl: 2 .  clopidogrel (PLAVIX) 75 MG tablet, Take 1 tablet (75 mg total) by mouth daily., Disp: 90 tablet, Rfl: 1 .  DEXILANT 60 MG capsule, TAKE 1 CAPSULE BY MOUTH DAILY, Disp: 30 capsule, Rfl: 11 .  fluticasone (FLONASE) 50 MCG/ACT nasal spray, Place 2 sprays into both nostrils daily., Disp: 16 g, Rfl: 2 .  gabapentin (NEURONTIN) 300 MG capsule, Take 300 mg by mouth. ONE OR TWO AT NIGHT, Disp: , Rfl:  .  glucose blood test strip, Use  as instructed, Disp: 100 each, Rfl: 12 .  ketoconazole (NIZORAL) 2 % cream, Apply 1 application topically daily., Disp: 60 g, Rfl: 0 .  LANTUS SOLOSTAR 100 UNIT/ML Solostar Pen, ADMINISTER 80 UNITS UNDER THE SKIN DAILY AT 10 PM, Disp: 15 mL, Rfl: 0 .  levothyroxine (SYNTHROID, LEVOTHROID) 50 MCG tablet, Take 1 tablet (50 mcg total) by mouth daily before breakfast., Disp: 90 tablet, Rfl: 1 .  losartan-hydrochlorothiazide (HYZAAR) 50-12.5 MG tablet, Take 1 tablet by mouth daily., Disp: 90 tablet, Rfl: 1 .  metformin (FORTAMET) 500 MG (OSM) 24 hr tablet, Take 1 tablet (500 mg total) by mouth 2 (two) times daily with a meal., Disp: 180 tablet, Rfl: 1 .  ONETOUCH DELICA LANCETS 16X MISC, 33 g by Does not apply route 2 (two) times daily. Test twice a day as directed, Disp: 100 each, Rfl: 2 .  Vitamin D, Ergocalciferol, (DRISDOL) 50000 units CAPS capsule, Take 1 capsule (50,000 Units total) by mouth once a week. For 12 weeks, Disp: 12 capsule, Rfl: 0  Current Facility-Administered Medications:  .  triamcinolone acetonide (KENALOG) 10 MG/ML injection 10 mg, 10 mg, Other, Once, Landis Martins, DPM  Allergies  Allergen Reactions  . Aspirin   . Codeine Nausea And Vomiting    RASH  . Duloxetine Hcl   . Lyrica [Pregabalin] Nausea And Vomiting  . Penicillins   . Sulfa Antibiotics Swelling     Review of Systems  Constitutional: Positive for fatigue.  Eyes: Negative for blurred vision.  Respiratory: Negative for shortness of breath.   Cardiovascular: Negative for chest pain and palpitations.  Neurological: Positive for headaches (sinus and allergy related headaches.).  Endo/Heme/Allergies: Positive for polydipsia.    Objective  Vitals:   01/21/16 0924  BP: 112/70  Pulse: 90  Resp: 16  Temp: 98.5 F (36.9 C)  TempSrc: Oral  SpO2: 96%  Weight: 174 lb (78.9 kg)  Height: 5' 4"  (1.626 m)    Physical Exam  Constitutional: She is well-developed, well-nourished, and in no distress.   Cardiovascular: Normal rate, regular rhythm and normal heart sounds.   No murmur heard. Pulmonary/Chest: Effort normal and breath sounds normal. She has no wheezes.  Abdominal: Soft. Bowel sounds are normal. There is tenderness in the right upper quadrant.  Musculoskeletal:       Right ankle: She exhibits no swelling.       Left ankle: She exhibits no swelling.  Nursing note and vitals reviewed.      Assessment & Plan  1. Uncontrolled type 2 diabetes mellitus with peripheral neuropathy (HCC) A1c is not at goal, Fasting BG 204 mg/dl. Will D/C Lantus and start on Xultophy at 16 units, advised to titrate up by 2 units every 3 days to achieve a  fasting BG target is 150 mg/dL for next month, recheck in 1 month. - POCT HgB A1C - POCT Glucose (CBG) - gabapentin (NEURONTIN) 300 MG capsule; Take 1 capsule (300 mg total) by mouth at bedtime.  Dispense: 90 capsule; Refill: 0  2. Vitamin D deficiency  - VITAMIN D 25 Hydroxy (Vit-D Deficiency, Fractures)  3. Generalized anxiety disorder Stable, controlled on Alprazolam, refills provided.  - ALPRAZolam (XANAX) 0.5 MG tablet; Take 1 tablet (0.5 mg total) by mouth 2 (two) times daily as needed. for anxiety  Dispense: 60 tablet; Refill: 2  4. Hx of transient ischemic attack (TIA)  - clopidogrel (PLAVIX) 75 MG tablet; Take 1 tablet (75 mg total) by mouth daily.  Dispense: 90 tablet; Refill: 1  5. Hyperlipidemia Advised to ask Gastroenterologist about lipid lowering therapy, pt. cannot take any statins due to elevated liver enzymes, work up for elevated liver enzymes in progress by GI, possible Liver Bx planned.  - Lipid panel   Kaycie Pegues Asad A. [facility] 01/21/2016 9:45 AM

## 2016-01-22 LAB — VITAMIN D 25 HYDROXY (VIT D DEFICIENCY, FRACTURES): Vit D, 25-Hydroxy: 28 ng/mL — ABNORMAL LOW (ref 30–100)

## 2016-01-22 NOTE — Telephone Encounter (Signed)
She needs to make an appointment for a liver biopsy.

## 2016-01-22 NOTE — Telephone Encounter (Signed)
Patient called saying that she needs a liver biopsy. I spoke with Ginger, she will send in the orders and call her back.

## 2016-01-22 NOTE — Telephone Encounter (Signed)
Patient should contact Dr. Allen Norris to set up a liver biopsy

## 2016-01-23 ENCOUNTER — Other Ambulatory Visit: Payer: Self-pay

## 2016-01-23 ENCOUNTER — Other Ambulatory Visit: Payer: Self-pay | Admitting: Family Medicine

## 2016-01-23 NOTE — Telephone Encounter (Signed)
Pt returned call and lab results given and well as information for the liver biopsy. Will call pt once date has been set.

## 2016-01-23 NOTE — Telephone Encounter (Signed)
Left vm again for a pt to return my call to discuss results. Pt is aware of the liver biopsy as she was notified by her PCP, but other results needed to be given. Order for liver biopsy has been sent to special scheduling for a date.

## 2016-01-24 ENCOUNTER — Emergency Department
Admission: EM | Admit: 2016-01-24 | Discharge: 2016-01-24 | Disposition: A | Discharge status: Home / Self Care | Payer: PPO | Attending: Emergency Medicine | Admitting: Emergency Medicine

## 2016-01-24 ENCOUNTER — Encounter: Payer: Self-pay | Admitting: Emergency Medicine

## 2016-01-24 DIAGNOSIS — I10 Essential (primary) hypertension: Secondary | ICD-10-CM | POA: Diagnosis not present

## 2016-01-24 DIAGNOSIS — E1165 Type 2 diabetes mellitus with hyperglycemia: Secondary | ICD-10-CM | POA: Diagnosis not present

## 2016-01-24 DIAGNOSIS — Z79899 Other long term (current) drug therapy: Secondary | ICD-10-CM | POA: Insufficient documentation

## 2016-01-24 DIAGNOSIS — E039 Hypothyroidism, unspecified: Secondary | ICD-10-CM | POA: Diagnosis not present

## 2016-01-24 DIAGNOSIS — Z8673 Personal history of transient ischemic attack (TIA), and cerebral infarction without residual deficits: Secondary | ICD-10-CM | POA: Diagnosis not present

## 2016-01-24 DIAGNOSIS — Z7984 Long term (current) use of oral hypoglycemic drugs: Secondary | ICD-10-CM | POA: Diagnosis not present

## 2016-01-24 DIAGNOSIS — R739 Hyperglycemia, unspecified: Secondary | ICD-10-CM

## 2016-01-24 DIAGNOSIS — R519 Headache, unspecified: Secondary | ICD-10-CM

## 2016-01-24 DIAGNOSIS — Z794 Long term (current) use of insulin: Secondary | ICD-10-CM | POA: Insufficient documentation

## 2016-01-24 DIAGNOSIS — R51 Headache: Secondary | ICD-10-CM | POA: Diagnosis not present

## 2016-01-24 DIAGNOSIS — E1149 Type 2 diabetes mellitus with other diabetic neurological complication: Secondary | ICD-10-CM | POA: Diagnosis not present

## 2016-01-24 DIAGNOSIS — F419 Anxiety disorder, unspecified: Secondary | ICD-10-CM | POA: Diagnosis not present

## 2016-01-24 LAB — BASIC METABOLIC PANEL
Anion gap: 10 (ref 5–15)
BUN: 20 mg/dL (ref 6–20)
CO2: 24 mmol/L (ref 22–32)
Calcium: 9.7 mg/dL (ref 8.9–10.3)
Chloride: 101 mmol/L (ref 101–111)
Creatinine, Ser: 1.03 mg/dL — ABNORMAL HIGH (ref 0.44–1.00)
GFR calc Af Amer: >60 mL/min (ref >60)
GFR calc non Af Amer: 54 mL/min — ABNORMAL LOW (ref >60)
Glucose, Bld: 236 mg/dL — ABNORMAL HIGH (ref 65–99)
Potassium: 4.5 mmol/L (ref 3.5–5.1)
Sodium: 135 mmol/L (ref 135–145)

## 2016-01-24 LAB — URINALYSIS COMPLETE WITH MICROSCOPIC (ARMC ONLY)
Bilirubin Urine: NEGATIVE
Glucose, UA: NEGATIVE mg/dL
Hgb urine dipstick: NEGATIVE
Ketones, ur: NEGATIVE mg/dL
Leukocytes, UA: NEGATIVE
Nitrite: POSITIVE — AB
Protein, ur: NEGATIVE mg/dL
RBC / HPF: NONE SEEN RBC/hpf (ref 0–5)
Specific Gravity, Urine: 1.006 (ref 1.005–1.030)
Squamous Epithelial / LPF: NONE SEEN
pH: 6 (ref 5.0–8.0)

## 2016-01-24 LAB — CBC
HCT: 43.1 % (ref 35.0–47.0)
Hemoglobin: 14.9 g/dL (ref 12.0–16.0)
MCH: 31.6 pg (ref 26.0–34.0)
MCHC: 34.5 g/dL (ref 32.0–36.0)
MCV: 91.6 fL (ref 80.0–100.0)
Platelets: 157 10*3/uL (ref 150–440)
RBC: 4.7 MIL/uL (ref 3.80–5.20)
RDW: 13.1 % (ref 11.5–14.5)
WBC: 7.4 10*3/uL (ref 3.6–11.0)

## 2016-01-24 LAB — GLUCOSE, CAPILLARY: Glucose-Capillary: 218 mg/dL — ABNORMAL HIGH (ref 65–99)

## 2016-01-24 MED ORDER — BUTALBITAL-APAP-CAFFEINE 50-325-40 MG PO TABS
ORAL_TABLET | ORAL | Status: AC
  Filled 2016-01-24: qty 2

## 2016-01-24 MED ORDER — BUTALBITAL-APAP-CAFFEINE 50-325-40 MG PO TABS
2.0000 | ORAL_TABLET | Freq: Once | ORAL | Status: AC
  Administered 2016-01-24: 2 via ORAL

## 2016-01-24 NOTE — ED Provider Notes (Signed)
[facility] Emergency Department Provider Note        Time seen: ----------------------------------------- 5:44 PM on 01/24/2016 -----------------------------------------    I have reviewed the triage vital signs and the nursing notes.   HISTORY  Chief Complaint Hyperglycemia    HPI Charlotte Herrera is a 70 y.o. female who presents to ER if she woke up with a headache today she checked her blood sugar and it was elevated. She reports she has diabetes and her medication was recently switched. She denies recent illness, has been under a lot of stress recently with deaths in the family. She states she is not sleeping well at night, denies fevers, chills or other complaints.   Past Medical History:  Diagnosis Date  . Depression   . Diabetes mellitus without complication ([place])   . Enlarged liver   . Fibromyalgia affecting multiple sites   . GERD (gastroesophageal reflux disease)   . Hyperlipidemia   . Hypertension   . Stroke ([place])   . Thyroid disease     Patient Active Problem List   Diagnosis Date Noted  . Hyperlipidemia 01/21/2016  . Environmental and seasonal allergies 12/05/2015  . Lipoma of abdominal wall 12/05/2015  . Dark urine 12/05/2015  . ERRONEOUS ENCOUNTER--DISREGARD 11/07/2015  . Abdominal mass, left lower quadrant 11/05/2015  . Hypertension 07/11/2015  . Chronic gout involving toe of right foot without tophus 05/28/2015  . Hypothyroidism 05/14/2015  . Pain and swelling of toe of left foot 04/04/2015  . Bilateral chronic knee pain 01/15/2015  . Vitamin B12 deficiency 12/25/2014  . Vitamin D deficiency 12/25/2014  . Type 2 diabetes mellitus with neurologic complication ([place]) 123456  . Dyslipidemia 12/25/2014  . [place] intertrigo 12/25/2014  . Anxiety disorder 12/25/2014  . Hx of transient ischemic attack (TIA) 12/25/2014  . Type 2 diabetes mellitus treated with insulin ([place]) 12/25/2014  . Abnormal liver enzymes 12/25/2014  .  H/O: depression 12/25/2014    Past Surgical History:  Procedure Laterality Date  . ABDOMINAL HYSTERECTOMY    . CARPAL TUNNEL RELEASE Left   . COLONOSCOPY  2015  . SPINE SURGERY    . VARICOSE VEIN SURGERY Left 1975    Allergies Aspirin; Codeine; Duloxetine hcl; Lyrica [pregabalin]; Penicillins; and Sulfa antibiotics  Social History Social History  Substance Use Topics  . Smoking status: Never Smoker  . Smokeless tobacco: Never Used  . Alcohol use No    Review of Systems Constitutional: Negative for fever. Eyes: Negative for visual changes. ENT: Negative for sore throat. Cardiovascular: Negative for chest pain. Respiratory: Negative for shortness of breath. Gastrointestinal: Negative for abdominal pain, vomiting and diarrhea. Genitourinary: Negative for dysuria. Musculoskeletal: Negative for back pain. Skin: Negative for rash. Neurological: Positive for headache  10-point ROS otherwise negative.  ____________________________________________   PHYSICAL EXAM:  VITAL SIGNS: ED Triage Vitals  Enc Vitals Group     BP 01/24/16 1509 (!) 151/77     Pulse Rate 01/24/16 1509 90     Resp 01/24/16 1509 18     Temp 01/24/16 1509 97.6 F (36.4 C)     Temp Source 01/24/16 1509 Oral     SpO2 01/24/16 1509 96 %     Weight 01/24/16 1509 173 lb (78.5 kg)     Height 01/24/16 1509 5\' 4"  (1.626 m)     Head Circumference --      Peak Flow --      Pain Score 01/24/16 1510 7     Pain Loc --  Pain Edu? --      Excl. in [place]? --     Constitutional: Alert and oriented. Well appearing and in no distress. Eyes: Conjunctivae are normal. PERRL. Normal extraocular movements. ENT   Head: Normocephalic and atraumatic.   Nose: No congestion/rhinnorhea.   Mouth/Throat: Mucous membranes are moist.   Neck: No stridor. Cardiovascular: Normal rate, regular rhythm. No murmurs, rubs, or gallops. Respiratory: Normal respiratory effort without tachypnea nor retractions. Breath  sounds are clear and equal bilaterally. No wheezes/rales/rhonchi. Gastrointestinal: Soft and nontender. Normal bowel sounds Musculoskeletal: Nontender with normal range of motion in all extremities. No lower extremity tenderness nor edema. Neurologic:  Normal speech and language. No gross focal neurologic deficits are appreciated.  Skin:  Skin is warm, dry and intact. No rash noted. Psychiatric: Mood and affect are normal. Speech and behavior are normal.  ____________________________________________  ED COURSE:  Pertinent labs & imaging results that were available during my care of the patient were reviewed by me and considered in my medical decision making (see chart for details). Patient is in no acute distress, will check basic labs and reevaluate. ____________________________________________    LABS (pertinent positives/negatives)  Labs Reviewed  BASIC METABOLIC PANEL - Abnormal; Notable for the following:       Result Value   Glucose, Bld 236 (*)    Creatinine, Ser 1.03 (*)    GFR calc non Af Amer 54 (*)    All other components within normal limits  URINALYSIS COMPLETEWITH MICROSCOPIC ([facility] ONLY) - Abnormal; Notable for the following:    Color, Urine YELLOW (*)    APPearance CLEAR (*)    Nitrite POSITIVE (*)    Bacteria, UA RARE (*)    All other components within normal limits  GLUCOSE, CAPILLARY - Abnormal; Notable for the following:    Glucose-Capillary 218 (*)    All other components within normal limits  CBC  CBG MONITORING, ED   ____________________________________________  FINAL ASSESSMENT AND PLAN  Hyperglycemia, anxiety  Plan: Patient with labs as dictated above. Patient is in no acute distress, abdomen or Fioricet for her headache. I think stress is one of her major problems likely causing poor sleep and headache. She is stable for outpatient follow-up with her doctor.   Earleen Newport, MD   Note: This dictation was prepared with Dragon  dictation. Any transcriptional errors that result from this process are unintentional    Earleen Newport, MD 01/24/16 1746

## 2016-01-24 NOTE — ED Triage Notes (Signed)
Pt reports woke up with headache today and checked her sugar and it was 310. Pt reports DM medication was recently switched.

## 2016-01-28 ENCOUNTER — Other Ambulatory Visit: Payer: Self-pay

## 2016-01-29 ENCOUNTER — Telehealth: Payer: Self-pay | Admitting: Emergency Medicine

## 2016-01-29 NOTE — Telephone Encounter (Addendum)
Have called this patient several times and no response back. Last called was 01/29/16. Letter sent. Patient called back today and will follow up with Dr. Allen Norris. Was last seen by him in July. Patient was also notified of lab results

## 2016-02-04 ENCOUNTER — Telehealth: Payer: Self-pay | Admitting: Family Medicine

## 2016-02-06 NOTE — Telephone Encounter (Signed)
PA has been complete. Waiting on reply.

## 2016-02-18 DIAGNOSIS — M9905 Segmental and somatic dysfunction of pelvic region: Secondary | ICD-10-CM | POA: Diagnosis not present

## 2016-02-18 DIAGNOSIS — M5136 Other intervertebral disc degeneration, lumbar region: Secondary | ICD-10-CM | POA: Diagnosis not present

## 2016-02-18 DIAGNOSIS — M9903 Segmental and somatic dysfunction of lumbar region: Secondary | ICD-10-CM | POA: Diagnosis not present

## 2016-02-18 DIAGNOSIS — M5431 Sciatica, right side: Secondary | ICD-10-CM | POA: Diagnosis not present

## 2016-02-25 ENCOUNTER — Ambulatory Visit (INDEPENDENT_AMBULATORY_CARE_PROVIDER_SITE_OTHER): Payer: PPO | Admitting: Family Medicine

## 2016-02-25 ENCOUNTER — Encounter: Payer: Self-pay | Admitting: Family Medicine

## 2016-02-25 VITALS — BP 133/70 | HR 93 | Temp 97.8°F | Resp 17 | Ht 64.0 in | Wt 171.1 lb

## 2016-02-25 DIAGNOSIS — I10 Essential (primary) hypertension: Secondary | ICD-10-CM | POA: Diagnosis not present

## 2016-02-25 DIAGNOSIS — E1142 Type 2 diabetes mellitus with diabetic polyneuropathy: Secondary | ICD-10-CM

## 2016-02-25 DIAGNOSIS — M1A9XX0 Chronic gout, unspecified, without tophus (tophi): Secondary | ICD-10-CM

## 2016-02-25 DIAGNOSIS — Z794 Long term (current) use of insulin: Secondary | ICD-10-CM

## 2016-02-25 DIAGNOSIS — E038 Other specified hypothyroidism: Secondary | ICD-10-CM | POA: Diagnosis not present

## 2016-02-25 LAB — TSH: TSH: 1.77 mIU/L

## 2016-02-25 LAB — URIC ACID: Uric Acid, Serum: 5.8 mg/dL (ref 2.5–7.0)

## 2016-02-25 MED ORDER — INSULIN DEGLUDEC 100 UNIT/ML ~~LOC~~ SOPN: 42.0000 [IU] | PEN_INJECTOR | Freq: Every day | SUBCUTANEOUS | 2 refills | Status: DC

## 2016-02-25 MED ORDER — ALLOPURINOL 100 MG PO TABS: 100.0000 mg | ORAL_TABLET | Freq: Two times a day (BID) | ORAL | 0 refills | Status: DC

## 2016-02-25 MED ORDER — LOSARTAN POTASSIUM-HCTZ 50-12.5 MG PO TABS: 1.0000 | ORAL_TABLET | Freq: Every day | ORAL | 1 refills | Status: DC

## 2016-02-25 MED ORDER — LEVOTHYROXINE SODIUM 50 MCG PO TABS: 50.0000 ug | ORAL_TABLET | Freq: Every day | ORAL | 1 refills | Status: DC

## 2016-02-25 MED ORDER — METFORMIN HCL ER (OSM) 500 MG PO TB24: 500.0000 mg | ORAL_TABLET | Freq: Two times a day (BID) | ORAL | 1 refills | Status: DC

## 2016-02-25 MED ORDER — LIRAGLUTIDE 18 MG/3ML ~~LOC~~ SOPN: 1.2000 mg | PEN_INJECTOR | Freq: Every day | SUBCUTANEOUS | 2 refills | Status: DC

## 2016-02-25 NOTE — Progress Notes (Signed)
Name: Charlotte Herrera   MRN: [id number]    DOB: 18-Jan-1946   Date:02/25/2016       Progress Note  Subjective  Chief Complaint  Chief Complaint  Patient presents with  . Follow-up    1 mo    Thyroid Problem  Presents for follow-up visit. Symptoms include constipation, fatigue and weight loss. Patient reports no cold intolerance, palpitations or weight gain. (Has been experiencing intermittent neck pain for 1 month, feelslike there's a 'knot in there') The symptoms have been stable.  Hypertension  This is a chronic problem. The problem is unchanged. Pertinent negatives include no blurred vision, chest pain, headaches, palpitations or shortness of breath. Past treatments include angiotensin blockers and diuretics. Hypertensive end-organ damage includes CVA and a thyroid problem. There is no history of kidney disease or CAD/MI.  Diabetes  She presents for her follow-up diabetic visit. She has type 2 diabetes mellitus. Her disease course has been improving. Pertinent negatives for hypoglycemia include no headaches. Associated symptoms include fatigue and weight loss. Pertinent negatives for diabetes include no blurred vision and no chest pain. Diabetic complications include a CVA. Current diabetic treatment includes intensive insulin program and oral agent (dual therapy).     Past Medical History:  Diagnosis Date  . Depression   . Diabetes mellitus without complication ([place])   . Enlarged liver   . Fibromyalgia affecting multiple sites   . GERD (gastroesophageal reflux disease)   . Hyperlipidemia   . Hypertension   . Stroke ([place])   . Thyroid disease     Past Surgical History:  Procedure Laterality Date  . ABDOMINAL HYSTERECTOMY    . CARPAL TUNNEL RELEASE Left   . COLONOSCOPY  2015  . SPINE SURGERY    . VARICOSE VEIN SURGERY Left 1975    Family History  Problem Relation Age of Onset  . Cancer Mother   . Breast cancer Mother     64's  . Diabetes Father   . Hearing loss Father   .  Cancer Father   . Diabetes Brother   . Cancer Brother   . Breast cancer Maternal Aunt     40's    Social History   Social History  . Marital status: Divorced    Spouse name: N/A  . Number of children: N/A  . Years of education: N/A   Occupational History  . Not on file.   Social History Main Topics  . Smoking status: Never Smoker  . Smokeless tobacco: Never Used  . Alcohol use No  . Drug use: No  . Sexual activity: Not on file   Other Topics Concern  . Not on file   Social History Narrative  . No narrative on file     Current Outpatient Prescriptions:  .  allopurinol (ZYLOPRIM) 100 MG tablet, Take 1 tablet (100 mg total) by mouth 2 (two) times daily., Disp: 180 tablet, Rfl: 0 .  ALPRAZolam (XANAX) 0.5 MG tablet, Take 1 tablet (0.5 mg total) by mouth 2 (two) times daily as needed. for anxiety, Disp: 60 tablet, Rfl: 2 .  B-D UF III MINI PEN NEEDLES 31G X 5 MM MISC, use as directed at bedtime, Disp: 100 each, Rfl: 2 .  Blood Glucose Monitoring Suppl (ONE TOUCH ULTRA MINI) W/DEVICE KIT, 1 Device by Does not apply route 2 (two) times daily., Disp: 1 each, Rfl: 0 .  cetirizine (ZYRTEC) 10 MG tablet, Take 10 mg by mouth daily., Disp: , Rfl:  .  cloNIDine (CATAPRES) 0.1  MG tablet, Take 1 tablet (0.1 mg total) by mouth 2 (two) times daily. (Patient taking differently: Take 0.1 mg by mouth daily. ), Disp: 180 tablet, Rfl: 2 .  clopidogrel (PLAVIX) 75 MG tablet, Take 1 tablet (75 mg total) by mouth daily., Disp: 90 tablet, Rfl: 1 .  DEXILANT 60 MG capsule, TAKE 1 CAPSULE BY MOUTH DAILY, Disp: 30 capsule, Rfl: 11 .  fluticasone (FLONASE) 50 MCG/ACT nasal spray, Place 2 sprays into both nostrils daily., Disp: 16 g, Rfl: 2 .  gabapentin (NEURONTIN) 300 MG capsule, Take 1 capsule (300 mg total) by mouth at bedtime., Disp: 90 capsule, Rfl: 0 .  glucose blood test strip, Use as instructed, Disp: 100 each, Rfl: 12 .  Insulin Degludec-Liraglutide (XULTOPHY) 100-3.6 UNIT-MG/ML SOPN, Inject  16 Units into the skin at bedtime., Disp: 1 pen, Rfl: 3 .  ketoconazole (NIZORAL) 2 % cream, Apply 1 application topically daily., Disp: 60 g, Rfl: 0 .  LANTUS SOLOSTAR 100 UNIT/ML Solostar Pen, ADMINISTER 80 UNITS UNDER THE SKIN DAILY AT 10 PM, Disp: 15 mL, Rfl: 0 .  levothyroxine (SYNTHROID, LEVOTHROID) 50 MCG tablet, Take 1 tablet (50 mcg total) by mouth daily before breakfast., Disp: 90 tablet, Rfl: 1 .  losartan-hydrochlorothiazide (HYZAAR) 50-12.5 MG tablet, Take 1 tablet by mouth daily., Disp: 90 tablet, Rfl: 1 .  metformin (FORTAMET) 500 MG (OSM) 24 hr tablet, Take 1 tablet (500 mg total) by mouth 2 (two) times daily with a meal., Disp: 180 tablet, Rfl: 1 .  ONETOUCH DELICA LANCETS 54T MISC, 33 g by Does not apply route 2 (two) times daily. Test twice a day as directed, Disp: 100 each, Rfl: 2 .  Vitamin D, Ergocalciferol, (DRISDOL) 50000 units CAPS capsule, TAKE 1 CAPSULE BY MOUTH 1 TIME A WEEK FOR 12 WEEKS, Disp: 12 capsule, Rfl: 0  Current Facility-Administered Medications:  .  triamcinolone acetonide (KENALOG) 10 MG/ML injection 10 mg, 10 mg, Other, Once, Landis Martins, DPM  Allergies  Allergen Reactions  . Aspirin   . Codeine Nausea And Vomiting    RASH  . Duloxetine Hcl   . Lyrica [Pregabalin] Nausea And Vomiting  . Penicillins   . Sulfa Antibiotics Swelling     Review of Systems  Constitutional: Positive for fatigue and weight loss. Negative for weight gain.  Eyes: Negative for blurred vision.  Respiratory: Negative for shortness of breath.   Cardiovascular: Negative for chest pain and palpitations.  Gastrointestinal: Positive for constipation.  Neurological: Negative for headaches.  Endo/Heme/Allergies: Negative for cold intolerance.    Objective  Vitals:   02/25/16 1001  BP: 133/70  Pulse: 93  Resp: 17  Temp: 97.8 F (36.6 C)  TempSrc: Oral  SpO2: 97%  Weight: 171 lb 1.6 oz (77.6 kg)  Height: _0  (1.626 m)    Physical Exam  Constitutional: She is  well-developed, well-nourished, and in no distress.  HENT:  Head: Normocephalic and atraumatic.  Neck: Neck supple. No thyroid mass and no thyromegaly present.  Cardiovascular: Normal rate, regular rhythm, S1 normal and S2 normal.   No murmur heard. Pulmonary/Chest: Effort normal and breath sounds normal. No respiratory distress. She has no wheezes.  Abdominal: Soft. Normal aorta. There is no tenderness.  Musculoskeletal:       Right ankle: She exhibits no swelling.       Left ankle: She exhibits no swelling.  Psychiatric: Mood, memory, affect and judgment normal.  Nursing note and vitals reviewed.    Assessment & Plan  1. Chronic gout  involving toe of right foot without tophus, unspecified cause  - allopurinol (ZYLOPRIM) 100 MG tablet; Take 1 tablet (100 mg total) by mouth 2 (two) times daily.  Dispense: 180 tablet; Refill: 0 - Uric acid  2. Essential hypertension  - losartan-hydrochlorothiazide (HYZAAR) 50-12.5 MG tablet; Take 1 tablet by mouth daily.  Dispense: 90 tablet; Refill: 1  3. Other specified hypothyroidism  - levothyroxine (SYNTHROID, LEVOTHROID) 50 MCG tablet; Take 1 tablet (50 mcg total) by mouth daily before breakfast.  Dispense: 90 tablet; Refill: 1 - TSH  4. Type 2 diabetes mellitus with diabetic polyneuropathy, with [place]-term current use of insulin ([place]) Patient could not afford Xultophy, we will change to [place] and Victoza. Continue on metformin at present dosage - metformin (FORTAMET) 500 MG (OSM) 24 hr tablet; Take 1 tablet (500 mg total) by mouth 2 (two) times daily with a meal.  Dispense: 180 tablet; Refill: 1 - Insulin Degludec (TRESIBA FLEXTOUCH) 100 UNIT/ML SOPN; Inject 42 Units into the skin at bedtime.  Dispense: 1 pen; Refill: 2 - Liraglutide (VICTOZA) 18 MG/3ML SOPN; Inject 0.2 mLs (1.2 mg total) into the skin at bedtime.  Dispense: 6 mL; Refill: 2   Advait Buice Asad A. [facility] 02/25/2016 10:30 AM

## 2016-03-03 ENCOUNTER — Telehealth: Payer: Self-pay | Admitting: Family Medicine

## 2016-03-03 DIAGNOSIS — M9903 Segmental and somatic dysfunction of lumbar region: Secondary | ICD-10-CM | POA: Diagnosis not present

## 2016-03-03 DIAGNOSIS — M5431 Sciatica, right side: Secondary | ICD-10-CM | POA: Diagnosis not present

## 2016-03-03 DIAGNOSIS — M5136 Other intervertebral disc degeneration, lumbar region: Secondary | ICD-10-CM | POA: Diagnosis not present

## 2016-03-03 DIAGNOSIS — M9905 Segmental and somatic dysfunction of pelvic region: Secondary | ICD-10-CM | POA: Diagnosis not present

## 2016-03-04 ENCOUNTER — Other Ambulatory Visit: Payer: Self-pay | Admitting: Family Medicine

## 2016-03-04 NOTE — Telephone Encounter (Signed)
DONE

## 2016-03-06 ENCOUNTER — Telehealth: Payer: Self-pay

## 2016-03-06 NOTE — Telephone Encounter (Signed)
Daughter needs to speak to you concerning her mothers medication. Stated that she has tried to reach someone previously. Please call [place]on Romp [phone]

## 2016-03-06 NOTE — Telephone Encounter (Signed)
Returned call and left a voice message for patient's daughter

## 2016-03-09 ENCOUNTER — Other Ambulatory Visit: Payer: Self-pay

## 2016-03-09 DIAGNOSIS — M5136 Other intervertebral disc degeneration, lumbar region: Secondary | ICD-10-CM | POA: Diagnosis not present

## 2016-03-09 DIAGNOSIS — M9905 Segmental and somatic dysfunction of pelvic region: Secondary | ICD-10-CM | POA: Diagnosis not present

## 2016-03-09 DIAGNOSIS — M9903 Segmental and somatic dysfunction of lumbar region: Secondary | ICD-10-CM | POA: Diagnosis not present

## 2016-03-09 DIAGNOSIS — R748 Abnormal levels of other serum enzymes: Secondary | ICD-10-CM

## 2016-03-09 DIAGNOSIS — M5431 Sciatica, right side: Secondary | ICD-10-CM | POA: Diagnosis not present

## 2016-03-10 ENCOUNTER — Telehealth: Payer: Self-pay | Admitting: Family Medicine

## 2016-03-10 ENCOUNTER — Telehealth: Payer: Self-pay

## 2016-03-10 NOTE — Telephone Encounter (Signed)
Called pt to inform her of her appt for the Liver biopsy. Was scheduled at [facility] on Wednesday, Sept 20th @ 8:00am. Pt advised me she was told by Dr. Jamal Collin that she didn't need to have this biopsy done and by another physician she didn't name. I advised her that Dr. Allen Norris is a hepatologist and has recommended her to have this done. Pt still not wanting to schedule. I have contacted central scheduling to cancel procedure.

## 2016-03-10 NOTE — Telephone Encounter (Signed)
Spoke with patient's daughter, Delilah Shan, in regards to patient's diabetic medication.  I did inform Delilah Shan that diabetic patient wanted, which was xultophy was denied by her insurance.  Kendall informed me that [employer] stated that if Dr. Manuella Ghazi wrote a letter stating that the other insulind didn't work for her and what the benefits of taking xultophy would be.

## 2016-03-11 ENCOUNTER — Ambulatory Visit (INDEPENDENT_AMBULATORY_CARE_PROVIDER_SITE_OTHER): Payer: PPO | Admitting: Family Medicine

## 2016-03-11 ENCOUNTER — Encounter: Payer: Self-pay | Admitting: Family Medicine

## 2016-03-11 VITALS — BP 137/71 | HR 101 | Temp 97.9°F | Resp 17 | Ht 64.0 in | Wt 173.0 lb

## 2016-03-11 DIAGNOSIS — Z23 Encounter for immunization: Secondary | ICD-10-CM | POA: Diagnosis not present

## 2016-03-11 DIAGNOSIS — E1140 Type 2 diabetes mellitus with diabetic neuropathy, unspecified: Secondary | ICD-10-CM | POA: Diagnosis not present

## 2016-03-11 DIAGNOSIS — R740 Nonspecific elevation of levels of transaminase and lactic acid dehydrogenase [LDH]: Secondary | ICD-10-CM

## 2016-03-11 DIAGNOSIS — M1A9XX0 Chronic gout, unspecified, without tophus (tophi): Secondary | ICD-10-CM | POA: Diagnosis not present

## 2016-03-11 DIAGNOSIS — Z794 Long term (current) use of insulin: Secondary | ICD-10-CM | POA: Diagnosis not present

## 2016-03-11 DIAGNOSIS — R7401 Elevation of levels of liver transaminase levels: Secondary | ICD-10-CM | POA: Insufficient documentation

## 2016-03-11 MED ORDER — ALLOPURINOL 100 MG PO TABS: 100.0000 mg | ORAL_TABLET | Freq: Two times a day (BID) | ORAL | 0 refills | Status: DC

## 2016-03-11 NOTE — Progress Notes (Signed)
Name: Charlotte Herrera   MRN: [id number]    DOB: 26-Jan-1946   Date:03/11/2016       Progress Note  Subjective  Chief Complaint  Chief Complaint  Patient presents with  . Medication Problem    Discuss DM medication (pt states Victoza makes her sick)    HPI  Pt. Presents for Diabetes follow up, she was started on Victoza 1.2 mg daily for poorly controlled Diabetes. SHe was started on Xultophy prior to that which was denied by her [employer].  She reports Victoza makes her sick and dizzy and that she has stopped taking it since last weekend.  Last A1c in July 2017 was 8.8%.   Past Medical History:  Diagnosis Date  . Depression   . Diabetes mellitus without complication ([place])   . Enlarged liver   . Fibromyalgia affecting multiple sites   . GERD (gastroesophageal reflux disease)   . Hyperlipidemia   . Hypertension   . Stroke ([place])   . Thyroid disease     Past Surgical History:  Procedure Laterality Date  . ABDOMINAL HYSTERECTOMY    . CARPAL TUNNEL RELEASE Left   . COLONOSCOPY  2015  . SPINE SURGERY    . VARICOSE VEIN SURGERY Left 1975    Family History  Problem Relation Age of Onset  . Cancer Mother   . Breast cancer Mother     6's  . Diabetes Father   . Hearing loss Father   . Cancer Father   . Diabetes Brother   . Cancer Brother   . Breast cancer Maternal Aunt     40's    Social History   Social History  . Marital status: Divorced    Spouse name: N/A  . Number of children: N/A  . Years of education: N/A   Occupational History  . Not on file.   Social History Main Topics  . Smoking status: Never Smoker  . Smokeless tobacco: Never Used  . Alcohol use No  . Drug use: No  . Sexual activity: Not on file   Other Topics Concern  . Not on file   Social History Narrative  . No narrative on file     Current Outpatient Prescriptions:  .  allopurinol (ZYLOPRIM) 100 MG tablet, Take 1 tablet (100 mg total) by mouth 2 (two) times daily., Disp: 180  tablet, Rfl: 0 .  ALPRAZolam (XANAX) 0.5 MG tablet, Take 1 tablet (0.5 mg total) by mouth 2 (two) times daily as needed. for anxiety, Disp: 60 tablet, Rfl: 2 .  B-D UF III MINI PEN NEEDLES 31G X 5 MM MISC, use as directed at bedtime, Disp: 100 each, Rfl: 2 .  Blood Glucose Monitoring Suppl (ONE TOUCH ULTRA MINI) W/DEVICE KIT, 1 Device by Does not apply route 2 (two) times daily., Disp: 1 each, Rfl: 0 .  cetirizine (ZYRTEC) 10 MG tablet, Take 10 mg by mouth daily., Disp: , Rfl:  .  cloNIDine (CATAPRES) 0.1 MG tablet, Take 1 tablet (0.1 mg total) by mouth 2 (two) times daily. (Patient taking differently: Take 0.1 mg by mouth daily. ), Disp: 180 tablet, Rfl: 2 .  clopidogrel (PLAVIX) 75 MG tablet, Take 1 tablet (75 mg total) by mouth daily., Disp: 90 tablet, Rfl: 1 .  DEXILANT 60 MG capsule, TAKE 1 CAPSULE BY MOUTH DAILY, Disp: 30 capsule, Rfl: 11 .  fluticasone (FLONASE) 50 MCG/ACT nasal spray, Place 2 sprays into both nostrils daily., Disp: 16 g, Rfl: 2 .  gabapentin (NEURONTIN)  300 MG capsule, Take 1 capsule (300 mg total) by mouth at bedtime., Disp: 90 capsule, Rfl: 0 .  Insulin Degludec (TRESIBA FLEXTOUCH) 100 UNIT/ML SOPN, Inject 42 Units into the skin at bedtime., Disp: 1 pen, Rfl: 2 .  ketoconazole (NIZORAL) 2 % cream, Apply 1 application topically daily., Disp: 60 g, Rfl: 0 .  LANTUS SOLOSTAR 100 UNIT/ML Solostar Pen, ADMINISTER 80 UNITS UNDER THE SKIN DAILY AT 10 PM, Disp: 15 mL, Rfl: 0 .  levothyroxine (SYNTHROID, LEVOTHROID) 50 MCG tablet, Take 1 tablet (50 mcg total) by mouth daily before breakfast., Disp: 90 tablet, Rfl: 1 .  Liraglutide (VICTOZA) 18 MG/3ML SOPN, Inject 0.2 mLs (1.2 mg total) into the skin at bedtime., Disp: 6 mL, Rfl: 2 .  losartan-hydrochlorothiazide (HYZAAR) 50-12.5 MG tablet, Take 1 tablet by mouth daily., Disp: 90 tablet, Rfl: 1 .  metformin (FORTAMET) 500 MG (OSM) 24 hr tablet, Take 1 tablet (500 mg total) by mouth 2 (two) times daily with a meal., Disp: 180 tablet,  Rfl: 1 .  ONE TOUCH ULTRA TEST test strip, TEST twice a day, Disp: 100 each, Rfl: 11 .  ONETOUCH DELICA LANCETS 61Y MISC, 33 g by Does not apply route 2 (two) times daily. Test twice a day as directed, Disp: 100 each, Rfl: 2 .  Vitamin D, Ergocalciferol, (DRISDOL) 50000 units CAPS capsule, TAKE 1 CAPSULE BY MOUTH 1 TIME A WEEK FOR 12 WEEKS, Disp: 12 capsule, Rfl: 0  Current Facility-Administered Medications:  .  triamcinolone acetonide (KENALOG) 10 MG/ML injection 10 mg, 10 mg, Other, Once, Landis Martins, DPM  Allergies  Allergen Reactions  . Aspirin   . Codeine Nausea And Vomiting    RASH  . Duloxetine Hcl   . Lyrica [Pregabalin] Nausea And Vomiting  . Penicillins   . Sulfa Antibiotics Swelling     Review of Systems  Constitutional: Positive for malaise/fatigue. Negative for chills and fever.  Gastrointestinal: Positive for nausea. Negative for abdominal pain and vomiting.  Musculoskeletal: Negative for joint pain.     Objective  Vitals:   03/11/16 1508  BP: 137/71  Pulse: (!) 101  Resp: 17  Temp: 97.9 F (36.6 C)  TempSrc: Oral  SpO2: 96%  Weight: 173 lb (78.5 kg)  Height: 5' 4"  (1.626 m)    Physical Exam  Constitutional: She is oriented to person, place, and time and well-developed, well-nourished, and in no distress.  HENT:  Head: Normocephalic and atraumatic.  Cardiovascular: Normal rate, regular rhythm and normal heart sounds.   No murmur heard. Pulmonary/Chest: Effort normal and breath sounds normal.  Neurological: She is alert and oriented to person, place, and time.  Psychiatric: Mood, memory, affect and judgment normal.  Nursing note and vitals reviewed.    Assessment & Plan  1. Chronic gout involving toe of right foot without tophus, unspecified cause No recent gout attacks over the last uric acid level within range - allopurinol (ZYLOPRIM) 100 MG tablet; Take 1 tablet (100 mg total) by mouth 2 (two) times daily.  Dispense: 180 tablet; Refill:  0  2. Type 2 diabetes mellitus with diabetic neuropathy, with Wieber-term current use of insulin ([place]) We will provide samples of Xultophy to the patient, will be requested from the manufacturer. In the meantime, we'll contact her insurance company to try and get Xultophy approved as she is intolerant to Victoza  3. Transaminitis  - Hepatic function panel  4. Need for immunization against influenza  - Flu vaccine HIGH DOSE PF (Fluzone High dose)  Zoiey Christy Asad A. [facility] 03/11/2016 3:23 PM

## 2016-03-12 LAB — HEPATIC FUNCTION PANEL
ALT: 50 U/L — ABNORMAL HIGH (ref 6–29)
AST: 75 U/L — ABNORMAL HIGH (ref 10–35)
Albumin: 4 g/dL (ref 3.6–5.1)
Alkaline Phosphatase: 56 U/L (ref 33–130)
Bilirubin, Direct: 0.1 mg/dL (ref <=0.2)
Indirect Bilirubin: 0.3 mg/dL (ref 0.2–1.2)
Total Bilirubin: 0.4 mg/dL (ref 0.2–1.2)
Total Protein: 7.7 g/dL (ref 6.1–8.1)

## 2016-03-18 ENCOUNTER — Ambulatory Visit: Payer: PPO

## 2016-03-23 ENCOUNTER — Encounter: Payer: Self-pay | Admitting: Family Medicine

## 2016-03-23 ENCOUNTER — Ambulatory Visit (INDEPENDENT_AMBULATORY_CARE_PROVIDER_SITE_OTHER): Payer: PPO | Admitting: Family Medicine

## 2016-03-23 VITALS — BP 133/64 | HR 108 | Temp 97.9°F | Resp 17 | Ht 64.0 in | Wt 166.2 lb

## 2016-03-23 DIAGNOSIS — J029 Acute pharyngitis, unspecified: Secondary | ICD-10-CM

## 2016-03-23 LAB — POCT RAPID STREP A (OFFICE): Rapid Strep A Screen: NEGATIVE

## 2016-03-23 MED ORDER — AZITHROMYCIN 250 MG PO TABS: ORAL_TABLET | ORAL | 0 refills | Status: DC

## 2016-03-23 NOTE — Progress Notes (Signed)
.  acute

## 2016-03-23 NOTE — Progress Notes (Signed)
Name: Charlotte Herrera   MRN: [id number]    DOB: 05-02-1946   Date:03/23/2016       Progress Note  Subjective  Chief Complaint  Chief Complaint  Patient presents with  . Acute Visit    chills/ body aches/ earache x4 days  . Sore Throat    Sore Throat   This is a new problem. The current episode started in the past 7 days. The problem has been unchanged. There has been no fever. Associated symptoms include congestion, coughing, ear pain and headaches. Pertinent negatives include no ear discharge. She has had no exposure to strep. Treatments tried: Green Tea with lemon to soothe her throat., also took Mucinex. The treatment provided no relief.    Past Medical History:  Diagnosis Date  . Depression   . Diabetes mellitus without complication ([place])   . Enlarged liver   . Fibromyalgia affecting multiple sites   . GERD (gastroesophageal reflux disease)   . Hyperlipidemia   . Hypertension   . Stroke ([place])   . Thyroid disease     Past Surgical History:  Procedure Laterality Date  . ABDOMINAL HYSTERECTOMY    . CARPAL TUNNEL RELEASE Left   . COLONOSCOPY  2015  . SPINE SURGERY    . VARICOSE VEIN SURGERY Left 1975    Family History  Problem Relation Age of Onset  . Cancer Mother   . Breast cancer Mother     39's  . Diabetes Father   . Hearing loss Father   . Cancer Father   . Diabetes Brother   . Cancer Brother   . Breast cancer Maternal Aunt     40's    Social History   Social History  . Marital status: Divorced    Spouse name: N/A  . Number of children: N/A  . Years of education: N/A   Occupational History  . Not on file.   Social History Main Topics  . Smoking status: Never Smoker  . Smokeless tobacco: Never Used  . Alcohol use No  . Drug use: No  . Sexual activity: Not on file   Other Topics Concern  . Not on file   Social History Narrative  . No narrative on file     Current Outpatient Prescriptions:  .  allopurinol (ZYLOPRIM) 100 MG tablet, Take 1  tablet (100 mg total) by mouth 2 (two) times daily., Disp: 180 tablet, Rfl: 0 .  ALPRAZolam (XANAX) 0.5 MG tablet, Take 1 tablet (0.5 mg total) by mouth 2 (two) times daily as needed. for anxiety, Disp: 60 tablet, Rfl: 2 .  B-D UF III MINI PEN NEEDLES 31G X 5 MM MISC, use as directed at bedtime, Disp: 100 each, Rfl: 2 .  Blood Glucose Monitoring Suppl (ONE TOUCH ULTRA MINI) W/DEVICE KIT, 1 Device by Does not apply route 2 (two) times daily., Disp: 1 each, Rfl: 0 .  cetirizine (ZYRTEC) 10 MG tablet, Take 10 mg by mouth daily., Disp: , Rfl:  .  cloNIDine (CATAPRES) 0.1 MG tablet, Take 1 tablet (0.1 mg total) by mouth 2 (two) times daily. (Patient taking differently: Take 0.1 mg by mouth daily. ), Disp: 180 tablet, Rfl: 2 .  clopidogrel (PLAVIX) 75 MG tablet, Take 1 tablet (75 mg total) by mouth daily., Disp: 90 tablet, Rfl: 1 .  DEXILANT 60 MG capsule, TAKE 1 CAPSULE BY MOUTH DAILY, Disp: 30 capsule, Rfl: 11 .  fluticasone (FLONASE) 50 MCG/ACT nasal spray, Place 2 sprays into both nostrils daily., Disp:  16 g, Rfl: 2 .  gabapentin (NEURONTIN) 300 MG capsule, Take 1 capsule (300 mg total) by mouth at bedtime., Disp: 90 capsule, Rfl: 0 .  Insulin Degludec (TRESIBA FLEXTOUCH) 100 UNIT/ML SOPN, Inject 42 Units into the skin at bedtime., Disp: 1 pen, Rfl: 2 .  ketoconazole (NIZORAL) 2 % cream, Apply 1 application topically daily., Disp: 60 g, Rfl: 0 .  levothyroxine (SYNTHROID, LEVOTHROID) 50 MCG tablet, Take 1 tablet (50 mcg total) by mouth daily before breakfast., Disp: 90 tablet, Rfl: 1 .  Liraglutide (VICTOZA) 18 MG/3ML SOPN, Inject 0.2 mLs (1.2 mg total) into the skin at bedtime., Disp: 6 mL, Rfl: 2 .  losartan-hydrochlorothiazide (HYZAAR) 50-12.5 MG tablet, Take 1 tablet by mouth daily., Disp: 90 tablet, Rfl: 1 .  metformin (FORTAMET) 500 MG (OSM) 24 hr tablet, Take 1 tablet (500 mg total) by mouth 2 (two) times daily with a meal., Disp: 180 tablet, Rfl: 1 .  ONE TOUCH ULTRA TEST test strip, TEST twice  a day, Disp: 100 each, Rfl: 11 .  ONETOUCH DELICA LANCETS 87G MISC, 33 g by Does not apply route 2 (two) times daily. Test twice a day as directed, Disp: 100 each, Rfl: 2 .  Vitamin D, Ergocalciferol, (DRISDOL) 50000 units CAPS capsule, TAKE 1 CAPSULE BY MOUTH 1 TIME A WEEK FOR 12 WEEKS, Disp: 12 capsule, Rfl: 0  Current Facility-Administered Medications:  .  triamcinolone acetonide (KENALOG) 10 MG/ML injection 10 mg, 10 mg, Other, Once, Landis Martins, DPM  Allergies  Allergen Reactions  . Aspirin   . Codeine Nausea And Vomiting    RASH  . Duloxetine Hcl   . Lyrica [Pregabalin] Nausea And Vomiting  . Penicillins   . Sulfa Antibiotics Swelling    Review of Systems  HENT: Positive for congestion, ear pain and sore throat. Negative for ear discharge.   Respiratory: Positive for cough.   Cardiovascular: Negative for chest pain.  Neurological: Positive for headaches.      Objective  Vitals:   03/23/16 1051  BP: 133/64  Pulse: (!) 108  Resp: 17  Temp: 97.9 F (36.6 C)  TempSrc: Oral  SpO2: 96%  Weight: 166 lb 3.2 oz (75.4 kg)  Height: _0  (1.626 m)    Physical Exam  Constitutional: She is well-developed, well-nourished, and in no distress.  HENT:  Head: Normocephalic and atraumatic.  Right Ear: Tympanic membrane and ear canal normal.  Left Ear: Tympanic membrane and ear canal normal.  Mouth/Throat: Oropharyngeal exudate, posterior oropharyngeal edema and posterior oropharyngeal erythema present.  Whitish solitary exudate on left tonsil  Cardiovascular: Regular rhythm, S1 normal and S2 normal.  Tachycardia present.   No murmur heard. Pulmonary/Chest: Effort normal and breath sounds normal. She has no wheezes. She has no rhonchi.  Lymphadenopathy:    She has cervical adenopathy.       Right cervical: Superficial cervical adenopathy present.       Left cervical: Superficial cervical adenopathy present.  Vitals reviewed.      Assessment & Plan  1.  Pharyngitis Pharyngitis unresponsive to conservative therapy, will start on azithromycin. - azithromycin (ZITHROMAX) 250 MG tablet; 2 tabs po day 1, then 1 tab po q day x 4 days  Dispense: 6 tablet; Refill: 0  2. Throat soreness Rapid strep A test is negative - POCT rapid strep A   Sallie Maker Asad A. [facility] 03/23/2016 11:06 AM

## 2016-04-14 ENCOUNTER — Other Ambulatory Visit: Payer: Self-pay | Admitting: Family Medicine

## 2016-04-21 ENCOUNTER — Encounter: Payer: Self-pay | Admitting: Family Medicine

## 2016-04-21 ENCOUNTER — Ambulatory Visit (INDEPENDENT_AMBULATORY_CARE_PROVIDER_SITE_OTHER): Payer: PPO | Admitting: Family Medicine

## 2016-04-21 VITALS — BP 120/70 | HR 100 | Temp 97.9°F | Resp 16 | Ht 64.0 in | Wt 164.4 lb

## 2016-04-21 DIAGNOSIS — F411 Generalized anxiety disorder: Secondary | ICD-10-CM | POA: Diagnosis not present

## 2016-04-21 DIAGNOSIS — E1142 Type 2 diabetes mellitus with diabetic polyneuropathy: Secondary | ICD-10-CM

## 2016-04-21 DIAGNOSIS — E559 Vitamin D deficiency, unspecified: Secondary | ICD-10-CM | POA: Diagnosis not present

## 2016-04-21 DIAGNOSIS — E1165 Type 2 diabetes mellitus with hyperglycemia: Secondary | ICD-10-CM

## 2016-04-21 DIAGNOSIS — IMO0002 Reserved for concepts with insufficient information to code with codable children: Secondary | ICD-10-CM

## 2016-04-21 MED ORDER — ALPRAZOLAM 0.5 MG PO TABS: 0.5000 mg | ORAL_TABLET | Freq: Two times a day (BID) | ORAL | 2 refills | Status: DC | PRN

## 2016-04-21 MED ORDER — XULTOPHY 100-3.6 UNIT-MG/ML ~~LOC~~ SOPN: 50.0000 [IU] | PEN_INJECTOR | Freq: Every day | SUBCUTANEOUS | 2 refills | Status: DC

## 2016-04-21 NOTE — Progress Notes (Signed)
Name: Charlotte Herrera   MRN: [id number]    DOB: Sep 14, 1945   Date:04/21/2016       Progress Note  Subjective  Chief Complaint  Chief Complaint  Patient presents with  . Follow-up    6 week recheck  . Anxiety    medication refills    Anxiety  Presents for follow-up visit. Symptoms include excessive worry, insomnia, irritability and nervous/anxious behavior. Patient reports no panic. The severity of symptoms is moderate and causing significant distress.      Past Medical History:  Diagnosis Date  . Depression   . Diabetes mellitus without complication ([place])   . Enlarged liver   . Fibromyalgia affecting multiple sites   . GERD (gastroesophageal reflux disease)   . Hyperlipidemia   . Hypertension   . Stroke ([place])   . Thyroid disease     Past Surgical History:  Procedure Laterality Date  . ABDOMINAL HYSTERECTOMY    . CARPAL TUNNEL RELEASE Left   . COLONOSCOPY  2015  . SPINE SURGERY    . VARICOSE VEIN SURGERY Left 1975    Family History  Problem Relation Age of Onset  . Cancer Mother   . Breast cancer Mother     9's  . Diabetes Father   . Hearing loss Father   . Cancer Father   . Diabetes Brother   . Cancer Brother   . Breast cancer Maternal Aunt     40's    Social History   Social History  . Marital status: Divorced    Spouse name: N/A  . Number of children: N/A  . Years of education: N/A   Occupational History  . Not on file.   Social History Main Topics  . Smoking status: Never Smoker  . Smokeless tobacco: Never Used  . Alcohol use No  . Drug use: No  . Sexual activity: Not on file   Other Topics Concern  . Not on file   Social History Narrative  . No narrative on file     Current Outpatient Prescriptions:  .  allopurinol (ZYLOPRIM) 100 MG tablet, Take 1 tablet (100 mg total) by mouth 2 (two) times daily., Disp: 180 tablet, Rfl: 0 .  ALPRAZolam (XANAX) 0.5 MG tablet, Take 1 tablet (0.5 mg total) by mouth 2 (two) times daily as needed. for  anxiety, Disp: 60 tablet, Rfl: 2 .  B-D UF III MINI PEN NEEDLES 31G X 5 MM MISC, use as directed at bedtime, Disp: 100 each, Rfl: 2 .  Blood Glucose Monitoring Suppl (ONE TOUCH ULTRA MINI) W/DEVICE KIT, 1 Device by Does not apply route 2 (two) times daily., Disp: 1 each, Rfl: 0 .  cetirizine (ZYRTEC) 10 MG tablet, Take 10 mg by mouth daily., Disp: , Rfl:  .  cloNIDine (CATAPRES) 0.1 MG tablet, Take 1 tablet (0.1 mg total) by mouth 2 (two) times daily. (Patient taking differently: Take 0.1 mg by mouth daily. ), Disp: 180 tablet, Rfl: 2 .  clopidogrel (PLAVIX) 75 MG tablet, Take 1 tablet (75 mg total) by mouth daily., Disp: 90 tablet, Rfl: 1 .  DEXILANT 60 MG capsule, TAKE 1 CAPSULE BY MOUTH DAILY, Disp: 30 capsule, Rfl: 11 .  fluticasone (FLONASE) 50 MCG/ACT nasal spray, Place 2 sprays into both nostrils daily., Disp: 16 g, Rfl: 2 .  gabapentin (NEURONTIN) 300 MG capsule, Take 1 capsule (300 mg total) by mouth at bedtime., Disp: 90 capsule, Rfl: 0 .  ketoconazole (NIZORAL) 2 % cream, Apply 1 application topically  daily., Disp: 60 g, Rfl: 0 .  levothyroxine (SYNTHROID, LEVOTHROID) 50 MCG tablet, Take 1 tablet (50 mcg total) by mouth daily before breakfast., Disp: 90 tablet, Rfl: 1 .  Liraglutide (VICTOZA) 18 MG/3ML SOPN, Inject 0.2 mLs (1.2 mg total) into the skin at bedtime., Disp: 6 mL, Rfl: 2 .  losartan-hydrochlorothiazide (HYZAAR) 50-12.5 MG tablet, Take 1 tablet by mouth daily., Disp: 90 tablet, Rfl: 1 .  metformin (FORTAMET) 500 MG (OSM) 24 hr tablet, Take 1 tablet (500 mg total) by mouth 2 (two) times daily with a meal., Disp: 180 tablet, Rfl: 1 .  ONE TOUCH ULTRA TEST test strip, TEST twice a day, Disp: 100 each, Rfl: 11 .  ONETOUCH DELICA LANCETS 16X MISC, 33 g by Does not apply route 2 (two) times daily. Test twice a day as directed, Disp: 100 each, Rfl: 2 .  Vitamin D, Ergocalciferol, (DRISDOL) 50000 units CAPS capsule, TAKE 1 CAPSULE BY MOUTH 1 TIME A WEEK FOR 12 WEEKS, Disp: 12 capsule,  Rfl: 0 .  XULTOPHY 100-3.6 UNIT-MG/ML SOPN, INJECT 16 UNITS SQ HS, Disp: , Rfl: 3  Current Facility-Administered Medications:  .  triamcinolone acetonide (KENALOG) 10 MG/ML injection 10 mg, 10 mg, Other, Once, Landis Martins, DPM  Allergies  Allergen Reactions  . Aspirin   . Codeine Nausea And Vomiting    RASH  . Duloxetine Hcl   . Lyrica [Pregabalin] Nausea And Vomiting  . Penicillins   . Sulfa Antibiotics Swelling     Review of Systems  Constitutional: Positive for irritability.  Psychiatric/Behavioral: The patient is nervous/anxious and has insomnia.     Objective  Vitals:   04/21/16 1359  BP: 120/70  Pulse: 100  Resp: 16  Temp: 97.9 F (36.6 C)  TempSrc: Oral  SpO2: 98%  Weight: 164 lb 6.4 oz (74.6 kg)  Height: _0  (1.626 m)    Physical Exam  Constitutional: She is well-developed, well-nourished, and in no distress.  HENT:  Head: Normocephalic and atraumatic.  Nose: Right sinus exhibits maxillary sinus tenderness. Left sinus exhibits maxillary sinus tenderness.  Mouth/Throat: Oropharynx is clear and moist. No posterior oropharyngeal erythema.  Cardiovascular: Regular rhythm, S1 normal and S2 normal.  Tachycardia present.   No murmur heard. Pulmonary/Chest: Effort normal and breath sounds normal. No respiratory distress. She has no wheezes. She has no rhonchi.  Skin: Skin is warm and dry.  Psychiatric: Mood, memory, affect and judgment normal.  Nursing note and vitals reviewed.     Assessment & Plan  1. Generalized anxiety disorder Stable, responsive to alprazolam, refills provided - ALPRAZolam (XANAX) 0.5 MG tablet; Take 1 tablet (0.5 mg total) by mouth 2 (two) times daily as needed. for anxiety  Dispense: 60 tablet; Refill: 2  2. Vitamin D deficiency Repeat vitamin D levels. - Vitamin D (25 hydroxy)  3. Uncontrolled type 2 diabetes mellitus with peripheral neuropathy (Charlotte Herrera) Refill for Xultophy provided, patient will return in one week to check  hemoglobin A1c - XULTOPHY 100-3.6 UNIT-MG/ML SOPN; Inject 50 Units into the skin at bedtime.  Dispense: 5 pen; Refill: 2   Charlotte Herrera Asad A. [facility] 04/21/2016 2:12 PM

## 2016-04-22 LAB — VITAMIN D 25 HYDROXY (VIT D DEFICIENCY, FRACTURES): Vit D, 25-Hydroxy: 35 ng/mL (ref 30–100)

## 2016-04-29 ENCOUNTER — Encounter: Payer: Self-pay | Admitting: Family Medicine

## 2016-04-29 ENCOUNTER — Ambulatory Visit (INDEPENDENT_AMBULATORY_CARE_PROVIDER_SITE_OTHER): Payer: PPO | Admitting: Family Medicine

## 2016-04-29 VITALS — BP 140/76 | HR 100 | Temp 97.9°F | Resp 16 | Ht 64.0 in | Wt 164.4 lb

## 2016-04-29 DIAGNOSIS — E1165 Type 2 diabetes mellitus with hyperglycemia: Secondary | ICD-10-CM | POA: Diagnosis not present

## 2016-04-29 DIAGNOSIS — I10 Essential (primary) hypertension: Secondary | ICD-10-CM

## 2016-04-29 DIAGNOSIS — E1142 Type 2 diabetes mellitus with diabetic polyneuropathy: Secondary | ICD-10-CM

## 2016-04-29 DIAGNOSIS — K529 Noninfective gastroenteritis and colitis, unspecified: Secondary | ICD-10-CM | POA: Diagnosis not present

## 2016-04-29 DIAGNOSIS — IMO0002 Reserved for concepts with insufficient information to code with codable children: Secondary | ICD-10-CM

## 2016-04-29 DIAGNOSIS — R748 Abnormal levels of other serum enzymes: Secondary | ICD-10-CM

## 2016-04-29 DIAGNOSIS — E785 Hyperlipidemia, unspecified: Secondary | ICD-10-CM | POA: Diagnosis not present

## 2016-04-29 LAB — POCT UA - MICROALBUMIN
Albumin/Creatinine Ratio, Urine, POC: NEGATIVE
Creatinine, POC: NEGATIVE mg/dL
Microalbumin Ur, POC: NEGATIVE mg/L

## 2016-04-29 LAB — POCT GLYCOSYLATED HEMOGLOBIN (HGB A1C): Hemoglobin A1C: 7.2

## 2016-04-29 MED ORDER — LOSARTAN POTASSIUM-HCTZ 50-12.5 MG PO TABS: 1.0000 | ORAL_TABLET | Freq: Every day | ORAL | 1 refills | Status: DC

## 2016-04-29 MED ORDER — METFORMIN HCL ER (OSM) 500 MG PO TB24: 500.0000 mg | ORAL_TABLET | Freq: Two times a day (BID) | ORAL | 1 refills | Status: DC

## 2016-04-29 NOTE — Progress Notes (Signed)
Name: Charlotte Herrera   MRN: [id number]    DOB: [birth year]   Date:04/29/2016       Progress Note  Subjective  Chief Complaint  Chief Complaint  Patient presents with  . Diabetes    follow up  . Abdominal Pain    for 2 days    Diabetes  She presents for her follow-up diabetic visit. She has type 2 diabetes mellitus. Her disease course has been improving. Hypoglycemia symptoms include headaches and nervousness/anxiousness. Associated symptoms include foot paresthesias and polydipsia. Pertinent negatives for diabetes include no blurred vision, no chest pain, no fatigue and no polyuria. Symptoms are stable. Diabetic complications include a CVA (history of TIA) and peripheral neuropathy. Pertinent negatives for diabetic complications include no heart disease. Current diabetic treatment includes oral agent (monotherapy) and intensive insulin program. She is following a diabetic diet. She participates in exercise weekly. Her breakfast blood glucose range is generally 110-130 mg/dl. An ACE inhibitor/angiotensin II receptor blocker is being taken.  Abdominal Pain  This is a new problem. The current episode started yesterday. The onset quality is sudden. The problem has been unchanged. The pain is located in the suprapubic region. The pain is at a severity of 7/10. The pain is moderate. The quality of the pain is sharp. Associated symptoms include diarrhea (loose stools since yesterday x 4) and headaches. Pertinent negatives include no fever, hematuria, nausea or vomiting. Treatments tried: Imodium.  Hyperlipidemia  This is a chronic problem. The problem is uncontrolled. Recent lipid tests were reviewed and are high. Exacerbating diseases include diabetes. Pertinent negatives include no chest pain or shortness of breath. Current antihyperlipidemic treatment includes diet change (Cannot take Statins because of elevated liver enzymes). Risk factors for coronary artery disease include dyslipidemia, diabetes  mellitus and hypertension.  Hypertension  This is a chronic problem. The problem is unchanged. The problem is controlled. Associated symptoms include headaches. Pertinent negatives include no blurred vision, chest pain, orthopnea, palpitations or shortness of breath. Past treatments include angiotensin blockers, diuretics and central alpha agonists. Hypertensive end-organ damage includes CVA (history of TIA).     Past Medical History:  Diagnosis Date  . Depression   . Enlarged liver   . Fibromyalgia affecting multiple sites   . GERD (gastroesophageal reflux disease)   . Hyperlipidemia   . Hypertension   . Stroke ([place])   . Thyroid disease     Past Surgical History:  Procedure Laterality Date  . ABDOMINAL HYSTERECTOMY    . CARPAL TUNNEL RELEASE Left   . COLONOSCOPY  2015  . SPINE SURGERY    . VARICOSE VEIN SURGERY Left 1975    Family History  Problem Relation Age of Onset  . Cancer Mother   . Breast cancer Mother     46's  . Diabetes Father   . Hearing loss Father   . Cancer Father   . Diabetes Brother   . Cancer Brother   . Breast cancer Maternal Aunt     40's    Social History   Social History  . Marital status: Divorced    Spouse name: N/A  . Number of children: N/A  . Years of education: N/A   Occupational History  . Not on file.   Social History Main Topics  . Smoking status: Never Smoker  . Smokeless tobacco: Never Used  . Alcohol use No  . Drug use: No  . Sexual activity: Not on file   Other Topics Concern  . Not on file  Social History Narrative  . No narrative on file     Current Outpatient Prescriptions:  .  allopurinol (ZYLOPRIM) 100 MG tablet, Take 1 tablet (100 mg total) by mouth 2 (two) times daily., Disp: 180 tablet, Rfl: 0 .  ALPRAZolam (XANAX) 0.5 MG tablet, Take 1 tablet (0.5 mg total) by mouth 2 (two) times daily as needed. for anxiety, Disp: 60 tablet, Rfl: 2 .  B-D UF III MINI PEN NEEDLES 31G X 5 MM MISC, use as directed at  bedtime, Disp: 100 each, Rfl: 2 .  Blood Glucose Monitoring Suppl (ONE TOUCH ULTRA MINI) W/DEVICE KIT, 1 Device by Does not apply route 2 (two) times daily., Disp: 1 each, Rfl: 0 .  cetirizine (ZYRTEC) 10 MG tablet, Take 10 mg by mouth daily., Disp: , Rfl:  .  cloNIDine (CATAPRES) 0.1 MG tablet, Take 1 tablet (0.1 mg total) by mouth 2 (two) times daily. (Patient taking differently: Take 0.1 mg by mouth daily. ), Disp: 180 tablet, Rfl: 2 .  clopidogrel (PLAVIX) 75 MG tablet, Take 1 tablet (75 mg total) by mouth daily., Disp: 90 tablet, Rfl: 1 .  DEXILANT 60 MG capsule, TAKE 1 CAPSULE BY MOUTH DAILY, Disp: 30 capsule, Rfl: 11 .  fluticasone (FLONASE) 50 MCG/ACT nasal spray, Place 2 sprays into both nostrils daily., Disp: 16 g, Rfl: 2 .  gabapentin (NEURONTIN) 300 MG capsule, Take 1 capsule (300 mg total) by mouth at bedtime., Disp: 90 capsule, Rfl: 0 .  ketoconazole (NIZORAL) 2 % cream, Apply 1 application topically daily., Disp: 60 g, Rfl: 0 .  levothyroxine (SYNTHROID, LEVOTHROID) 50 MCG tablet, Take 1 tablet (50 mcg total) by mouth daily before breakfast., Disp: 90 tablet, Rfl: 1 .  Liraglutide (VICTOZA) 18 MG/3ML SOPN, Inject 0.2 mLs (1.2 mg total) into the skin at bedtime., Disp: 6 mL, Rfl: 2 .  losartan-hydrochlorothiazide (HYZAAR) 50-12.5 MG tablet, Take 1 tablet by mouth daily., Disp: 90 tablet, Rfl: 1 .  metformin (FORTAMET) 500 MG (OSM) 24 hr tablet, Take 1 tablet (500 mg total) by mouth 2 (two) times daily with a meal., Disp: 180 tablet, Rfl: 1 .  ONE TOUCH ULTRA TEST test strip, TEST twice a day, Disp: 100 each, Rfl: 11 .  ONETOUCH DELICA LANCETS 69G MISC, 33 g by Does not apply route 2 (two) times daily. Test twice a day as directed, Disp: 100 each, Rfl: 2 .  Vitamin D, Ergocalciferol, (DRISDOL) 50000 units CAPS capsule, TAKE 1 CAPSULE BY MOUTH 1 TIME A WEEK FOR 12 WEEKS, Disp: 12 capsule, Rfl: 0 .  XULTOPHY 100-3.6 UNIT-MG/ML SOPN, Inject 50 Units into the skin at bedtime., Disp: 5 pen,  Rfl: 2  Current Facility-Administered Medications:  .  triamcinolone acetonide (KENALOG) 10 MG/ML injection 10 mg, 10 mg, Other, Once, Landis Martins, DPM  Allergies  Allergen Reactions  . Aspirin   . Codeine Nausea And Vomiting    RASH  . Duloxetine Hcl   . Lyrica [Pregabalin] Nausea And Vomiting  . Penicillins   . Sulfa Antibiotics Swelling     Review of Systems  Constitutional: Negative for fatigue and fever.  Eyes: Negative for blurred vision.  Respiratory: Negative for shortness of breath.   Cardiovascular: Negative for chest pain, palpitations and orthopnea.  Gastrointestinal: Positive for abdominal pain and diarrhea (loose stools since yesterday x 4). Negative for blood in stool, nausea and vomiting.  Genitourinary: Negative for hematuria.  Neurological: Positive for headaches.  Endo/Heme/Allergies: Positive for polydipsia.  Psychiatric/Behavioral: The patient is nervous/anxious.  Objective  Vitals:   04/29/16 0937  BP: 140/76  Pulse: 100  Resp: 16  Temp: 97.9 F (36.6 C)  TempSrc: Oral  SpO2: 96%  Weight: 164 lb 6.4 oz (74.6 kg)  Height: 5' 4"  (1.626 m)    Physical Exam  Constitutional: She is oriented to person, place, and time and well-developed, well-nourished, and in no distress.  HENT:  Head: Normocephalic and atraumatic.  Cardiovascular: Normal rate, regular rhythm and normal heart sounds.   No murmur heard. Pulmonary/Chest: Effort normal and breath sounds normal. She has no wheezes.  Abdominal: Soft. Bowel sounds are normal. There is tenderness in the suprapubic area.  Neurological: She is alert and oriented to person, place, and time.  Skin: Skin is warm, dry and intact.  Psychiatric: Mood, memory, affect and judgment normal.  Nursing note and vitals reviewed.    Assessment & Plan  1. Uncontrolled type 2 diabetes mellitus with peripheral neuropathy (HCC) Point-of-care A1c is 7.2%, consistent with well-controlled diabetes. Urine  microalbumin is negative. No change in pharmacotherapy - POCT HgB A1C - POCT UA - Microalbumin - metformin (FORTAMET) 500 MG (OSM) 24 hr tablet; Take 1 tablet (500 mg total) by mouth 2 (two) times daily with a meal.  Dispense: 180 tablet; Refill: 1  2. Essential hypertension  BP stable and controlled on present antihypertensive therapy - losartan-hydrochlorothiazide (HYZAAR) 50-12.5 MG tablet; Take 1 tablet by mouth daily.  Dispense: 90 tablet; Refill: 1  3. Hyperlipidemia, unspecified hyperlipidemia type  - Lipid Profile  4. Abnormal liver enzymes History of elevated liver enzymes, ultrasound showed fatty liver disease. Repeat today - Hepatic function panel  5. Enteritis Resolving, likely infectious. Advised to stay hydrated.    Norvell Ureste [facility] 04/29/2016 10:07 AM

## 2016-04-30 LAB — LIPID PANEL
Cholesterol: 216 mg/dL — ABNORMAL HIGH (ref 125–200)
HDL: 41 mg/dL — ABNORMAL LOW (ref >=46)
LDL Cholesterol: 108 mg/dL (ref <130)
Total CHOL/HDL Ratio: 5.3 Ratio — ABNORMAL HIGH (ref <=5.0)
Triglycerides: 335 mg/dL — ABNORMAL HIGH (ref <150)
VLDL: 67 mg/dL — ABNORMAL HIGH (ref <30)

## 2016-04-30 LAB — HEPATIC FUNCTION PANEL
ALT: 58 U/L — ABNORMAL HIGH (ref 6–29)
AST: 94 U/L — ABNORMAL HIGH (ref 10–35)
Albumin: 4.3 g/dL (ref 3.6–5.1)
Alkaline Phosphatase: 55 U/L (ref 33–130)
Bilirubin, Direct: 0.1 mg/dL (ref <=0.2)
Indirect Bilirubin: 0.4 mg/dL (ref 0.2–1.2)
Total Bilirubin: 0.5 mg/dL (ref 0.2–1.2)
Total Protein: 8.3 g/dL — ABNORMAL HIGH (ref 6.1–8.1)

## 2016-05-01 ENCOUNTER — Other Ambulatory Visit: Payer: Self-pay | Admitting: Family Medicine

## 2016-05-01 DIAGNOSIS — E785 Hyperlipidemia, unspecified: Secondary | ICD-10-CM

## 2016-05-04 ENCOUNTER — Emergency Department
Admission: EM | Admit: 2016-05-04 | Discharge: 2016-05-04 | Disposition: A | Discharge status: Home / Self Care | Payer: PPO | Attending: Emergency Medicine | Admitting: Emergency Medicine

## 2016-05-04 ENCOUNTER — Encounter: Payer: Self-pay | Admitting: Medical Oncology

## 2016-05-04 ENCOUNTER — Emergency Department: Payer: PPO

## 2016-05-04 DIAGNOSIS — R0789 Other chest pain: Secondary | ICD-10-CM | POA: Diagnosis not present

## 2016-05-04 DIAGNOSIS — E039 Hypothyroidism, unspecified: Secondary | ICD-10-CM | POA: Diagnosis not present

## 2016-05-04 DIAGNOSIS — Z79899 Other long term (current) drug therapy: Secondary | ICD-10-CM | POA: Insufficient documentation

## 2016-05-04 DIAGNOSIS — E119 Type 2 diabetes mellitus without complications: Secondary | ICD-10-CM | POA: Insufficient documentation

## 2016-05-04 DIAGNOSIS — R079 Chest pain, unspecified: Secondary | ICD-10-CM | POA: Diagnosis not present

## 2016-05-04 DIAGNOSIS — Z7984 Long term (current) use of oral hypoglycemic drugs: Secondary | ICD-10-CM | POA: Insufficient documentation

## 2016-05-04 DIAGNOSIS — I10 Essential (primary) hypertension: Secondary | ICD-10-CM | POA: Diagnosis not present

## 2016-05-04 LAB — CBC
HCT: 38.7 % (ref 35.0–47.0)
Hemoglobin: 13.6 g/dL (ref 12.0–16.0)
MCH: 32 pg (ref 26.0–34.0)
MCHC: 35.2 g/dL (ref 32.0–36.0)
MCV: 91 fL (ref 80.0–100.0)
Platelets: 170 10*3/uL (ref 150–440)
RBC: 4.26 MIL/uL (ref 3.80–5.20)
RDW: 13.5 % (ref 11.5–14.5)
WBC: 7.5 10*3/uL (ref 3.6–11.0)

## 2016-05-04 LAB — HEPATIC FUNCTION PANEL
ALT: 63 U/L — ABNORMAL HIGH (ref 14–54)
AST: 111 U/L — ABNORMAL HIGH (ref 15–41)
Albumin: 3.9 g/dL (ref 3.5–5.0)
Alkaline Phosphatase: 51 U/L (ref 38–126)
Bilirubin, Direct: <0.1 mg/dL — ABNORMAL LOW (ref 0.1–0.5)
Total Bilirubin: 0.7 mg/dL (ref 0.3–1.2)
Total Protein: 8.1 g/dL (ref 6.5–8.1)

## 2016-05-04 LAB — LIPASE, BLOOD: Lipase: 28 U/L (ref 11–51)

## 2016-05-04 LAB — BASIC METABOLIC PANEL
Anion gap: 8 (ref 5–15)
BUN: 17 mg/dL (ref 6–20)
CO2: 27 mmol/L (ref 22–32)
Calcium: 10.1 mg/dL (ref 8.9–10.3)
Chloride: 104 mmol/L (ref 101–111)
Creatinine, Ser: 0.92 mg/dL (ref 0.44–1.00)
GFR calc Af Amer: >60 mL/min (ref >60)
GFR calc non Af Amer: >60 mL/min (ref >60)
Glucose, Bld: 113 mg/dL — ABNORMAL HIGH (ref 65–99)
Potassium: 4.7 mmol/L (ref 3.5–5.1)
Sodium: 139 mmol/L (ref 135–145)

## 2016-05-04 LAB — TROPONIN I: Troponin I: <0.03 ng/mL (ref <0.03)

## 2016-05-04 NOTE — Discharge Instructions (Signed)
You have been seen in the Emergency Department (ED) today for chest pain.  As we have discussed today?s test results are normal, and we believe your pain is due to pain/strain and/or inflammation of the muscles and/or cartilage of your chest wall.  Take Tylenol according to the label instructions.  Read through the included information for additional treatment recommendations and precautions.   Continue to take your regular medications.   Return to the Emergency Department (ED) if you experience any further chest pain/pressure/tightness, difficulty breathing, or sudden sweating, or other symptoms that concern you.

## 2016-05-04 NOTE — ED Triage Notes (Signed)
Pt reports she began having rt sided chest pain yesterday, pain is sharp and radiates into rt arm and jaw.

## 2016-05-04 NOTE — ED Provider Notes (Signed)
[facility] Emergency Department Provider Note  ____________________________________________   First MD Initiated Contact with Patient 05/04/16 1415     (approximate)  I have reviewed the triage vital signs and the nursing notes.   HISTORY  Chief Complaint Chest Pain    HPI Charlotte Herrera is a 70 y.o. female with a past medical history that includes diabetes and prior carotid blockage and TIAswho presents for evaluation of acute onset right-sided chest wall pain that started yesterday.  She states that she was out and about running errands yesterday which is unusual for her due to her chronic arthritis and knee pain.  She was shopping and making multiple stops and does not know if she may have pulled something.  She noticed last night that her chest wall was hurting.  It is worse when she moves around and very tender when she presses on her chest wall.  She denies fever/chills, shortness of breath, nausea, vomiting, diaphoresis, abdominal pain, dizziness.  She states that rest makes it feel a little bit better and palpation and movement make it worse.  She states that she had a stress test years ago but cannot remember exactly when.  She has never had any history of heart disease of which she is aware although she has had a carotid blockage.   Past Medical History:  Diagnosis Date  . Depression   . Enlarged liver   . Fibromyalgia affecting multiple sites   . GERD (gastroesophageal reflux disease)   . Hyperlipidemia   . Hypertension   . Stroke ([place])   . Thyroid disease     Patient Active Problem List   Diagnosis Date Noted  . Transaminitis 03/11/2016  . Hyperlipidemia 01/21/2016  . Environmental and seasonal allergies 12/05/2015  . Lipoma of abdominal wall 12/05/2015  . Dark urine 12/05/2015  . ERRONEOUS ENCOUNTER--DISREGARD 11/07/2015  . Abdominal mass, left lower quadrant 11/05/2015  . Hypertension 07/11/2015  . 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 Hypothyroidism 05/14/2015  . Pain and swelling of toe of left foot 04/04/2015  . Bilateral chronic knee pain 01/15/2015  . Vitamin B12 deficiency 12/25/2014  . Vitamin D deficiency 12/25/2014  . Uncontrolled type 2 diabetes mellitus with peripheral neuropathy ([place]) 12/25/2014  . Dyslipidemia 12/25/2014  . [place] intertrigo 12/25/2014  . Anxiety disorder 12/25/2014  . Hx of transient ischemic attack (TIA) 12/25/2014  . Abnormal liver enzymes 12/25/2014  . H/O: depression 12/25/2014    Past Surgical History:  Procedure Laterality Date  . ABDOMINAL HYSTERECTOMY    . CARPAL TUNNEL RELEASE Left   . COLONOSCOPY  2015  . SPINE SURGERY    . VARICOSE VEIN SURGERY Left 1975    Prior to Admission medications   Medication Sig Start Date End Date Taking? Authorizing Provider  allopurinol (ZYLOPRIM) 100 MG tablet Take 1 tablet (100 mg total) by mouth 2 (two) times daily. 03/11/16   Roselee Nova, MD  ALPRAZolam Duanne Moron) 0.5 MG tablet Take 1 tablet (0.5 mg total) by mouth 2 (two) times daily as needed. for anxiety 04/21/16   Roselee Nova, MD  B-D UF III MINI PEN NEEDLES 31G X 5 MM MISC use as directed at bedtime 08/26/15   Roselee Nova, MD  Blood Glucose Monitoring Suppl (ONE TOUCH ULTRA MINI) W/DEVICE KIT 1 Device by Does not apply route 2 (two) times daily. 02/12/15   Roselee Nova, MD  cetirizine (ZYRTEC) 10 MG  tablet Take 10 mg by mouth daily.    Historical Provider, MD  cloNIDine (CATAPRES) 0.1 MG tablet Take 1 tablet (0.1 mg total) by mouth 2 (two) times daily. Patient taking differently: Take 0.1 mg by mouth daily.  07/11/15   Roselee Nova, MD  clopidogrel (PLAVIX) 75 MG tablet Take 1 tablet (75 mg total) by mouth daily. 01/21/16   Roselee Nova, MD  DEXILANT 60 MG capsule TAKE 1 CAPSULE BY MOUTH DAILY 10/29/15   Lucilla Lame, MD  fluticasone [facility]) 50 MCG/ACT nasal spray Place 2 sprays into both nostrils daily. 12/05/15   Roselee Nova,  MD  gabapentin (NEURONTIN) 300 MG capsule Take 1 capsule (300 mg total) by mouth at bedtime. 01/21/16   Roselee Nova, MD  ketoconazole (NIZORAL) 2 % cream Apply 1 application topically daily. 12/05/15   Roselee Nova, MD  levothyroxine (SYNTHROID, LEVOTHROID) 50 MCG tablet Take 1 tablet (50 mcg total) by mouth daily before breakfast. 02/25/16   Roselee Nova, MD  losartan-hydrochlorothiazide (HYZAAR) 50-12.5 MG tablet Take 1 tablet by mouth daily. 04/29/16   Roselee Nova, MD  metformin (FORTAMET) 500 MG (OSM) 24 hr tablet Take 1 tablet (500 mg total) by mouth 2 (two) times daily with a meal. 04/29/16   Roselee Nova, MD  ONE TOUCH ULTRA TEST test strip TEST twice a day 03/04/16   Roselee Nova, MD  [facility] DELICA LANCETS 91Y MISC 33 g by Does not apply route 2 (two) times daily. Test twice a day as directed 02/12/15   Roselee Nova, MD  Vitamin D, Ergocalciferol, (DRISDOL) 50000 units CAPS capsule TAKE 1 CAPSULE BY MOUTH 1 TIME A WEEK FOR 12 WEEKS 01/23/16   Roselee Nova, MD  XULTOPHY 100-3.6 UNIT-MG/ML SOPN Inject 50 Units into the skin at bedtime. 04/21/16   Roselee Nova, MD    Allergies Aspirin; Codeine; Duloxetine hcl; Lyrica [pregabalin]; Penicillins; and Sulfa antibiotics  Family History  Problem Relation Age of Onset  . Cancer Mother   . Breast cancer Mother     44's  . Diabetes Father   . Hearing loss Father   . Cancer Father   . Diabetes Brother   . Cancer Brother   . Breast cancer Maternal Aunt     40's    Social History Social History  Substance Use Topics  . Smoking status: Never Smoker  . Smokeless tobacco: Never Used  . Alcohol use No    Review of Systems Constitutional: No fever/chills Eyes: No visual changes. ENT: No sore throat. Cardiovascular: Right-sided chest wall pain Respiratory: Denies shortness of breath. Gastrointestinal: No abdominal pain.  No nausea, no vomiting.  No diarrhea.  No constipation. Genitourinary: Negative for  dysuria. Musculoskeletal: Negative for back pain. Skin: Negative for rash. Neurological: Negative for headaches, focal weakness or numbness.  10-point ROS otherwise negative.  ____________________________________________   PHYSICAL EXAM:  VITAL SIGNS: ED Triage Vitals  Enc Vitals Group     BP 05/04/16 1037 106/63     Pulse Rate 05/04/16 1037 88     Resp 05/04/16 1037 20     Temp 05/04/16 1037 98 F (36.7 C)     Temp Source 05/04/16 1037 Oral     SpO2 05/04/16 1037 96 %     Weight 05/04/16 1037 164 lb (74.4 kg)     Height 05/04/16 1037 5' 4"  (1.626 m)  Head Circumference --      Peak Flow --      Pain Score 05/04/16 1038 5     Pain Loc --      Pain Edu? --      Excl. in [place]? --     Constitutional: Alert and oriented. Well appearing and in no acute distress. Eyes: Conjunctivae are normal. PERRL. EOMI. Head: Atraumatic. Nose: No congestion/rhinnorhea. Mouth/Throat: Mucous membranes are moist.  Oropharynx non-erythematous. Neck: No stridor.  No meningeal signs.   Cardiovascular: Normal rate, regular rhythm. Good peripheral circulation. Grossly normal heart sounds. Respiratory: Normal respiratory effort.  No retractions. Lungs CTAB. Highly reproducible tenderness to palpation throughout the right sided chest wall.  The pain is also slightly reproduced with abduction and rotation of the right shoulder. Gastrointestinal: Soft and nontender. No distention.  Musculoskeletal: No lower extremity tenderness nor edema. No gross deformities of extremities. Neurologic:  Normal speech and language. No gross focal neurologic deficits are appreciated.  Skin:  Skin is warm, dry and intact. No rash noted. Psychiatric: Mood and affect are slightly depressed but essentially normal. Speech and behavior are normal.  ____________________________________________   LABS (all labs ordered are listed, but only abnormal results are displayed)  Labs Reviewed  BASIC METABOLIC PANEL - Abnormal;  Notable for the following:       Result Value   Glucose, Bld 113 (*)    All other components within normal limits  CBC  TROPONIN I  HEPATIC FUNCTION PANEL  LIPASE, BLOOD   ____________________________________________  EKG  ED ECG REPORT I, Cassie Henkels, the attending physician, personally viewed and interpreted this ECG.  Date: 05/04/2016 EKG Time: 10:36 AM Rate: 89 Rhythm: normal sinus rhythm QRS Axis: normal Intervals: normal ST/T Wave abnormalities: normal Conduction Disturbances: none Narrative Interpretation: unremarkable  ____________________________________________  RADIOLOGY   Dg Chest 2 View  Result Date: 05/04/2016 CLINICAL DATA:  Chest pain. EXAM: CHEST  2 VIEW COMPARISON:  01/10/2013 FINDINGS: Mediastinum hilar structures normal. Cardiomegaly with normal pulmonary vascularity. Tiny calcified nodules again noted consistent with granulomas. No pleural effusion or pneumothorax. Surgical clips in the neck. IMPRESSION: No acute cardiopulmonary disease. Tiny calcified pulmonary nodules again noted consistent with granulomas. Electronically Signed   By: Marcello Moores  Register   On: 05/04/2016 11:28    ____________________________________________   PROCEDURES  Procedure(s) performed:   Procedures   Critical Care performed: No ____________________________________________   INITIAL IMPRESSION / ASSESSMENT AND PLAN / ED COURSE  Pertinent labs & imaging results that were available during my care of the patient were reviewed by me and considered in my medical decision making (see chart for details).  HEART score 3 (low risk) in spite of medical risk factors.  Wells Score for PE is zero.  All her signs, symptoms, and history of present illness 0.2 musculoskeletal chest wall pain.  I provided reassurance and included her to continue taking Tylenol and use typical muscle strain treatment such as either heat pads or cold packs on her chest wall.  She is already taking  Plavix so I did not recommend that she take aspirin or give her aspirin on top of it; she reports that when she takes aspirin her bruising and bleeding increases significantly.  I encouraged her to follow up with her regular doctor to determine if she needed to see a cardiologist but I will also provide the name and number of a cardiologist with whom she can call if she wishes.  There is no indication for repeat troponin given  her low risk and the fact that the pain has been going on since yesterday; a 3 or 4 hour repeat would not give [place] any useful additional information.  She is comfortable with the plan and seems reassured by her normal workup today.   Clinical Course     ____________________________________________  FINAL CLINICAL IMPRESSION(S) / ED DIAGNOSES  Final diagnoses:  Right-sided chest wall pain     MEDICATIONS GIVEN DURING THIS VISIT:  Medications - No data to display   NEW OUTPATIENT MEDICATIONS STARTED DURING THIS VISIT:  New Prescriptions   No medications on file    Modified Medications   No medications on file    Discontinued Medications   No medications on file     Note:  This document was prepared using Dragon voice recognition software and may include unintentional dictation errors.    Hinda Kehr, MD 05/04/16 [phone]

## 2016-05-04 NOTE — ED Notes (Signed)
A/o. Chest pain 2/10. No acute distress.

## 2016-05-25 ENCOUNTER — Telehealth: Payer: Self-pay

## 2016-05-25 NOTE — Telephone Encounter (Signed)
Patient called stating that she has been getting calls from [facility] regarding a referral that she was not aware had been made. I told her that it was noted in our system that someone had spoken with her and that they informed her of her results along with the referral. She stated that she checked both of her phones and there was no missed calls from our office nor was there any messages left with her results. I then reviewed her most recent lab results and explained why Dr. Manuella Ghazi really thinks she should see a lipid specialist. She stated that she is not sure if her insurance will cover someone in [place] so she asks if we could find someone locally.  I found Troy Sine with [facility] in [place]  Areas of Interest Areas of interest include invasive and interventional cardiology, aggressive lipid and cardiovascular risk reduction management, hypertension, congestive heart failure and valvular heart disease, sleep medicine and the adverse effects of sleep apnea on cardiovascular health.  Please advise so I can contact patient  Thanks

## 2016-05-25 NOTE — Telephone Encounter (Signed)
That is appropriate, patient needs aggressive lipid-lowering, she is not a candidate for statins because of elevated liver enzymes from fatty liver disease

## 2016-05-26 ENCOUNTER — Telehealth: Payer: Self-pay | Admitting: Family Medicine

## 2016-05-26 NOTE — Telephone Encounter (Signed)
Patient was informed that Dr. Manuella Ghazi has agreed to send her to see Dr. Troy Sine with [place] at [facility]. Patient was given their number so that she could get scheduled.

## 2016-05-26 NOTE — Telephone Encounter (Signed)
Per the request of Dr. Rochel Brome,  This patient's referral has been updated and sent to Dr. Troy Sine with [facility] in [place] for review.

## 2016-05-26 NOTE — Telephone Encounter (Signed)
Pt would like a call back at home on [phone]

## 2016-06-12 DIAGNOSIS — M1712 Unilateral primary osteoarthritis, left knee: Secondary | ICD-10-CM | POA: Diagnosis not present

## 2016-06-24 ENCOUNTER — Ambulatory Visit (INDEPENDENT_AMBULATORY_CARE_PROVIDER_SITE_OTHER): Payer: PPO | Admitting: Family Medicine

## 2016-06-24 ENCOUNTER — Encounter: Payer: Self-pay | Admitting: Family Medicine

## 2016-06-24 DIAGNOSIS — F411 Generalized anxiety disorder: Secondary | ICD-10-CM

## 2016-06-24 DIAGNOSIS — B372 Candidiasis of skin and nail: Secondary | ICD-10-CM

## 2016-06-24 MED ORDER — ALPRAZOLAM 0.5 MG PO TABS: 0.5000 mg | ORAL_TABLET | Freq: Two times a day (BID) | ORAL | 2 refills | Status: DC | PRN

## 2016-06-24 MED ORDER — KETOCONAZOLE 2 % EX CREA: 1.0000 "application " | TOPICAL_CREAM | Freq: Every day | CUTANEOUS | 2 refills | Status: DC

## 2016-06-24 NOTE — Progress Notes (Signed)
Name: Charlotte Herrera   MRN: [id number]    DOB: [birth year]   Date:06/24/2016       Progress Note  Subjective  Chief Complaint  Chief Complaint  Patient presents with  . Follow-up    1 mo  . Medication Refill    xanax    Anxiety  Presents for follow-up visit. Symptoms include excessive worry, insomnia and nervous/anxious behavior. Patient reports no muscle tension or panic. The severity of symptoms is moderate.    Pt. is also requesting refill for Ketoconazole 2% cream to be applied for candidal rash underneath her breasts. SHe gets this rash multiple times a year and applying the cream usually helps relieve the rash.      Past Medical History:  Diagnosis Date  . Depression   . Enlarged liver   . Fibromyalgia affecting multiple sites   . GERD (gastroesophageal reflux disease)   . Hyperlipidemia   . Hypertension   . Stroke ([place])   . Thyroid disease     Past Surgical History:  Procedure Laterality Date  . ABDOMINAL HYSTERECTOMY    . CARPAL TUNNEL RELEASE Left   . COLONOSCOPY  2015  . SPINE SURGERY    . VARICOSE VEIN SURGERY Left 1975    Family History  Problem Relation Age of Onset  . Cancer Mother   . Breast cancer Mother     31's  . Diabetes Father   . Hearing loss Father   . Cancer Father   . Diabetes Brother   . Cancer Brother   . Breast cancer Maternal Aunt     40's    Social History   Social History  . Marital status: Divorced    Spouse name: N/A  . Number of children: N/A  . Years of education: N/A   Occupational History  . Not on file.   Social History Main Topics  . Smoking status: Never Smoker  . Smokeless tobacco: Never Used  . Alcohol use No  . Drug use: No  . Sexual activity: Not on file   Other Topics Concern  . Not on file   Social History Narrative  . No narrative on file     Current Outpatient Prescriptions:  .  allopurinol (ZYLOPRIM) 100 MG tablet, Take 1 tablet (100 mg total) by mouth 2 (two) times daily., Disp: 180  tablet, Rfl: 0 .  ALPRAZolam (XANAX) 0.5 MG tablet, Take 1 tablet (0.5 mg total) by mouth 2 (two) times daily as needed. for anxiety, Disp: 60 tablet, Rfl: 2 .  B-D UF III MINI PEN NEEDLES 31G X 5 MM MISC, use as directed at bedtime, Disp: 100 each, Rfl: 2 .  Blood Glucose Monitoring Suppl (ONE TOUCH ULTRA MINI) W/DEVICE KIT, 1 Device by Does not apply route 2 (two) times daily., Disp: 1 each, Rfl: 0 .  cetirizine (ZYRTEC) 10 MG tablet, Take 10 mg by mouth daily., Disp: , Rfl:  .  cloNIDine (CATAPRES) 0.1 MG tablet, Take 1 tablet (0.1 mg total) by mouth 2 (two) times daily. (Patient taking differently: Take 0.1 mg by mouth daily. ), Disp: 180 tablet, Rfl: 2 .  clopidogrel (PLAVIX) 75 MG tablet, Take 1 tablet (75 mg total) by mouth daily., Disp: 90 tablet, Rfl: 1 .  DEXILANT 60 MG capsule, TAKE 1 CAPSULE BY MOUTH DAILY, Disp: 30 capsule, Rfl: 11 .  fluticasone (FLONASE) 50 MCG/ACT nasal spray, Place 2 sprays into both nostrils daily., Disp: 16 g, Rfl: 2 .  gabapentin (NEURONTIN) 300 MG  capsule, Take 1 capsule (300 mg total) by mouth at bedtime., Disp: 90 capsule, Rfl: 0 .  ketoconazole (NIZORAL) 2 % cream, Apply 1 application topically daily., Disp: 60 g, Rfl: 0 .  levothyroxine (SYNTHROID, LEVOTHROID) 50 MCG tablet, Take 1 tablet (50 mcg total) by mouth daily before breakfast., Disp: 90 tablet, Rfl: 1 .  losartan-hydrochlorothiazide (HYZAAR) 50-12.5 MG tablet, Take 1 tablet by mouth daily., Disp: 90 tablet, Rfl: 1 .  metformin (FORTAMET) 500 MG (OSM) 24 hr tablet, Take 1 tablet (500 mg total) by mouth 2 (two) times daily with a meal., Disp: 180 tablet, Rfl: 1 .  ONE TOUCH ULTRA TEST test strip, TEST twice a day, Disp: 100 each, Rfl: 11 .  ONETOUCH DELICA LANCETS 81K MISC, 33 g by Does not apply route 2 (two) times daily. Test twice a day as directed, Disp: 100 each, Rfl: 2 .  XULTOPHY 100-3.6 UNIT-MG/ML SOPN, Inject 50 Units into the skin at bedtime., Disp: 5 pen, Rfl: 2 .  Vitamin D,  Ergocalciferol, (DRISDOL) 50000 units CAPS capsule, TAKE 1 CAPSULE BY MOUTH 1 TIME A WEEK FOR 12 WEEKS (Patient not taking: Reported on 06/24/2016), Disp: 12 capsule, Rfl: 0  Current Facility-Administered Medications:  .  triamcinolone acetonide (KENALOG) 10 MG/ML injection 10 mg, 10 mg, Other, Once, Landis Martins, DPM  Allergies  Allergen Reactions  . Aspirin   . Codeine Nausea And Vomiting    RASH  . Duloxetine Hcl   . Lyrica [Pregabalin] Nausea And Vomiting  . Penicillins   . Sulfa Antibiotics Swelling     Review of Systems  Constitutional: Negative for chills, fever and malaise/fatigue.  Skin: Negative for itching and rash.  Psychiatric/Behavioral: The patient is nervous/anxious and has insomnia.      Objective  Vitals:   06/24/16 1026  BP: 109/69  Pulse: (!) 101  Resp: 17  Temp: 97.6 F (36.4 C)  TempSrc: Oral  SpO2: 97%  Weight: 166 lb 9.6 oz (75.6 kg)  Height: 5' 4"  (1.626 m)    Physical Exam  Constitutional: She is oriented to person, place, and time and well-developed, well-nourished, and in no distress.  HENT:  Head: Normocephalic and atraumatic.  Cardiovascular: Normal rate, regular rhythm and normal heart sounds.   No murmur heard. Pulmonary/Chest: Effort normal and breath sounds normal. She has no wheezes.  Neurological: She is alert and oriented to person, place, and time.  Psychiatric: Mood, memory, affect and judgment normal.  Nursing note and vitals reviewed.      Assessment & Plan  1. Candidal intertrigo  - ketoconazole (NIZORAL) 2 % cream; Apply 1 application topically daily.  Dispense: 60 g; Refill: 2  2. Generalized anxiety disorder Stable and responsive to alprazolam, refills provided - ALPRAZolam (XANAX) 0.5 MG tablet; Take 1 tablet (0.5 mg total) by mouth 2 (two) times daily as needed. for anxiety  Dispense: 60 tablet; Refill: 2   Orris Perin [facility] 06/24/2016 10:39 AM

## 2016-06-26 ENCOUNTER — Ambulatory Visit: Payer: PPO | Admitting: Family Medicine

## 2016-06-30 ENCOUNTER — Other Ambulatory Visit: Payer: Self-pay | Admitting: Family Medicine

## 2016-07-02 NOTE — Telephone Encounter (Signed)
Pen  Needles have been refilled and sent to [facility]

## 2016-07-08 ENCOUNTER — Ambulatory Visit (INDEPENDENT_AMBULATORY_CARE_PROVIDER_SITE_OTHER): Payer: PPO | Admitting: Cardiovascular Disease

## 2016-07-08 VITALS — BP 106/60 | HR 86 | Ht 64.0 in | Wt 169.0 lb

## 2016-07-08 DIAGNOSIS — K760 Fatty (change of) liver, not elsewhere classified: Secondary | ICD-10-CM

## 2016-07-08 DIAGNOSIS — E1165 Type 2 diabetes mellitus with hyperglycemia: Secondary | ICD-10-CM

## 2016-07-08 DIAGNOSIS — Z9889 Other specified postprocedural states: Secondary | ICD-10-CM

## 2016-07-08 DIAGNOSIS — I10 Essential (primary) hypertension: Secondary | ICD-10-CM | POA: Diagnosis not present

## 2016-07-08 DIAGNOSIS — E038 Other specified hypothyroidism: Secondary | ICD-10-CM | POA: Diagnosis not present

## 2016-07-08 DIAGNOSIS — IMO0002 Reserved for concepts with insufficient information to code with codable children: Secondary | ICD-10-CM

## 2016-07-08 DIAGNOSIS — E1142 Type 2 diabetes mellitus with diabetic polyneuropathy: Secondary | ICD-10-CM

## 2016-07-08 DIAGNOSIS — R740 Nonspecific elevation of levels of transaminase and lactic acid dehydrogenase [LDH]: Secondary | ICD-10-CM

## 2016-07-08 DIAGNOSIS — E785 Hyperlipidemia, unspecified: Secondary | ICD-10-CM

## 2016-07-08 DIAGNOSIS — R7401 Elevation of levels of liver transaminase levels: Secondary | ICD-10-CM

## 2016-07-08 MED ORDER — OMEGA-3-ACID ETHYL ESTERS 1 G PO CAPS: 1.0000 g | ORAL_CAPSULE | Freq: Two times a day (BID) | ORAL | 11 refills | Status: DC

## 2016-07-08 NOTE — Patient Instructions (Signed)
Medication Instructions:   START generic lovaza as directed on the bottle. This has been sent to your pharmacy.  Labwork:  Have blood work done fasting in 2 months. Lab slips have been provided to you today.  Testing/Procedures:  Carotid doppler ordered.  Follow-Up:  3 months.  Any Other Special Instructions Will Be Listed Below (If Applicable).

## 2016-07-13 ENCOUNTER — Telehealth: Payer: Self-pay | Admitting: Family Medicine

## 2016-07-13 NOTE — Telephone Encounter (Signed)
Pt was prescribed Xultophy 100/326 (which is helping with her sugar). She went to pick up her refill and it was going to cost her $85. Patient told the pharmacy that she was receiving LIS (Low Substance) and they told her that it would cost $151. Patient cannot afford this and she would like to know if you have any samples. Can be reached on cell [phone]

## 2016-07-15 ENCOUNTER — Encounter: Payer: Self-pay | Admitting: Cardiovascular Disease

## 2016-07-15 NOTE — Progress Notes (Signed)
Cardiology Office Note    Date:  07/15/2016   ID:  NYSIA DELL, DOB 02-02-1946, MRN [id number]  PCP:  Keith Rake, MD  Cardiologist:  Shelva Majestic, MD   Chief Complaint  Patient presents with  . New patient evaluation    referred by Dr Manuella Ghazi for High cholesterol, pt denied chest pain and SOB, pt c/o indigestion    History of Present Illness:  Charlotte Herrera is a 71 y.o. female who is referred through the courtesy of Dr Manuella Ghazi after she experienced atypical chest pain.  Evaluation of Charlotte Herrera echo, lipidemia and cardiovascular issues.  Charlotte Herrera has a history of hypertension, numerous TIAs dating back to 2004, is post right carotid endarterectomy 10-12 years ago which was done by vascular surgeon at [facility], and has a history of diabetes mellitus..  She has a very remote history of tobacco use but quit over 40 years ago.  She has a history of hyperlipidemia, hypothyroidism, depression, and has been documented to have fatty liver on ultrasound  With hepatic transaminitis.  Recently she developed nonexertional atypical chest pain which was not felt to be ischemic.  At the time she experienced some mild chest wall tenderness.  She presents for cardiology evaluation.  She denies any exertional precipitation of chest pain.  Any palpitations.  She denies presyncope or syncope. She has a a history of gout as well as plantar fasciitis and GERD.  Past Medical History:  Diagnosis Date  . Depression   . Enlarged liver   . Fibromyalgia affecting multiple sites   . GERD (gastroesophageal reflux disease)   . Hyperlipidemia   . Hypertension   . Stroke ([place])   . Thyroid disease     Past Surgical History:  Procedure Laterality Date  . ABDOMINAL HYSTERECTOMY    . CARPAL TUNNEL RELEASE Left   . COLONOSCOPY  2015  . SPINE SURGERY    . VARICOSE VEIN SURGERY Left 1975    Current Medications: Outpatient Medications Prior to Visit  Medication Sig Dispense Refill  . allopurinol (ZYLOPRIM) 100 MG  tablet Take 1 tablet (100 mg total) by mouth 2 (two) times daily. 180 tablet 0  . ALPRAZolam (XANAX) 0.5 MG tablet Take 1 tablet (0.5 mg total) by mouth 2 (two) times daily as needed. for anxiety 60 tablet 2  . B-D UF III MINI PEN NEEDLES 31G X 5 MM MISC use as directed at bedtime 100 each 2  . Blood Glucose Monitoring Suppl (ONE TOUCH ULTRA MINI) W/DEVICE KIT 1 Device by Does not apply route 2 (two) times daily. 1 each 0  . cetirizine (ZYRTEC) 10 MG tablet Take 10 mg by mouth daily.    . cloNIDine (CATAPRES) 0.1 MG tablet Take 1 tablet (0.1 mg total) by mouth 2 (two) times daily. (Patient taking differently: Take 0.1 mg by mouth daily. ) 180 tablet 2  . clopidogrel (PLAVIX) 75 MG tablet Take 1 tablet (75 mg total) by mouth daily. 90 tablet 1  . DEXILANT 60 MG capsule TAKE 1 CAPSULE BY MOUTH DAILY 30 capsule 11  . fluticasone (FLONASE) 50 MCG/ACT nasal spray Place 2 sprays into both nostrils daily. 16 g 2  . gabapentin (NEURONTIN) 300 MG capsule Take 1 capsule (300 mg total) by mouth at bedtime. (Patient taking differently: Take 300 mg by mouth as needed. ) 90 capsule 0  . ketoconazole (NIZORAL) 2 % cream Apply 1 application topically daily. 60 g 2  . levothyroxine (SYNTHROID, LEVOTHROID) 50 MCG tablet Take  1 tablet (50 mcg total) by mouth daily before breakfast. 90 tablet 1  . losartan-hydrochlorothiazide (HYZAAR) 50-12.5 MG tablet Take 1 tablet by mouth daily. 90 tablet 1  . metformin (FORTAMET) 500 MG (OSM) 24 hr tablet Take 1 tablet (500 mg total) by mouth 2 (two) times daily with a meal. 180 tablet 1  . ONE TOUCH ULTRA TEST test strip TEST twice a day 100 each 11  . ONETOUCH DELICA LANCETS 23T MISC 33 g by Does not apply route 2 (two) times daily. Test twice a day as directed 100 each 2  . XULTOPHY 100-3.6 UNIT-MG/ML SOPN Inject 50 Units into the skin at bedtime. 5 pen 2  . Vitamin D, Ergocalciferol, (DRISDOL) 50000 units CAPS capsule TAKE 1 CAPSULE BY MOUTH 1 TIME A WEEK FOR 12 WEEKS 12  capsule 0   Facility-Administered Medications Prior to Visit  Medication Dose Route Frequency Provider Last Rate Last Dose  . triamcinolone acetonide (KENALOG) 10 MG/ML injection 10 mg  10 mg Other Once [employer], DPM         Allergies:   Aspirin; Duloxetine hcl; Penicillins; Codeine; Lyrica [pregabalin]; and Sulfa antibiotics   Social History   Social History  . Marital status: Divorced    Spouse name: N/A  . Number of children: N/A  . Years of education: N/A   Social History Main Topics  . Smoking status: Never Smoker  . Smokeless tobacco: Never Used  . Alcohol use No  . Drug use: No  . Sexual activity: Not on file   Other Topics Concern  . Not on file   Social History Narrative  . No narrative on file     Family History:  The patient's family history includes Breast cancer in [place] Herrera maternal aunt and mother; Cancer in [place] Herrera brother, father, and mother; Diabetes in [place] Herrera brother and father; Hearing loss in [place] Herrera father.   ROS General: Negative; No fevers, chills, or night sweats;  HEENT: Negative; No changes in vision or hearing, sinus congestion, difficulty swallowing Pulmonary: Negative; No cough, wheezing, shortness of breath, hemoptysis Cardiovascular: see HPI GI: Negative; No nausea, vomiting, diarrhea, or abdominal pain GU: Negative; No dysuria, hematuria, or difficulty voiding Musculoskeletal: Negative; no myalgias, joint pain, or weakness Hematologic/Oncology: Negative; no easy bruising, bleeding Endocrine: Negative; no heat/cold intolerance; no diabetes Neuro: Negative; no changes in balance, headaches Skin: Negative; No rashes or skin lesions Psychiatric: h/o depression Sleep: Negative; No snoring, daytime sleepiness, hypersomnolence, bruxism, restless legs, hypnogognic hallucinations, no cataplexy Other comprehensive 14 point system review is negative.   PHYSICAL EXAM:   VS:  BP 106/60   Pulse 86   Ht _0  (1.626 m)   Wt 169 lb (76.7 kg)   BMI 29.01  kg/m     Wt Readings from Last 3 Encounters:  07/08/16 169 lb (76.7 kg)  06/24/16 166 lb 9.6 oz (75.6 kg)  05/04/16 164 lb (74.4 kg)    General: Alert, oriented, no distress.  Skin: normal turgor, no rashes, warm and dry HEENT: Normocephalic, atraumatic. Pupils equal round and reactive to light; sclera anicteric; extraocular muscles intact; Fundi disc flat, no hemorrhages or exudates Nose without nasal septal hypertrophy Mouth/Parynx benign; Mallinpatti scale 3 Neck: No JVD, no carotid bruits; normal carotid upstroke Lungs: clear to ausculatation and percussion; no wheezing or rales Chest wall: without tenderness to palpitation Heart: PMI not displaced, RRR, s1 s2 normal, 1/6 systolic murmur, no diastolic murmur, no rubs, gallops, thrills, or heaves Abdomen: soft, nontender; no hepatosplenomehaly, BS+;  abdominal aorta nontender and not dilated by palpation. Back: no CVA tenderness Pulses 2+ Musculoskeletal: full range of motion, normal strength, no joint deformities Extremities: no clubbing cyanosis or edema, Homan's sign negative  Neurologic: grossly nonfocal; Cranial nerves grossly wnl Psychologic: Normal mood and affect   Studies/Labs Reviewed:   ECG (independently read by me): Normal sinus rhythm at 86 bpm.  Poor anterior R-wave progression.  No significant ST-T changes.  QTc interval 440 ms.  Recent Labs: BMP Latest Ref Rng & Units 05/04/2016 01/24/2016 12/05/2015  Glucose 65 - 99 mg/dL 113(H) 236(H) 214(H)  BUN 6 - 20 mg/dL _0 Creatinine 0.44 - 1.00 mg/dL 0.92 1.03(H) 0.94  BUN/Creat Ratio 12 - 28 - - 20  Sodium 135 - 145 mmol/L 139 135 141  Potassium 3.5 - 5.1 mmol/L 4.7 4.5 4.8  Chloride 101 - 111 mmol/L 104 101 99  CO2 22 - 32 mmol/L _1 Calcium 8.9 - 10.3 mg/dL 10.1 9.7 9.7     Hepatic Function Latest Ref Rng & Units 05/04/2016 04/29/2016 03/11/2016  Total Protein 6.5 - 8.1 g/dL 8.1 8.3(H) 7.7  Albumin 3.5 - 5.0 g/dL 3.9 4.3 4.0  AST 15 - 41 U/L 111(H)  94(H) 75(H)  ALT 14 - 54 U/L 63(H) 58(H) 50(H)  Alk Phosphatase 38 - 126 U/L 51 55 56  Total Bilirubin 0.3 - 1.2 mg/dL 0.7 0.5 0.4  Bilirubin, Direct 0.1 - 0.5 mg/dL <0.1(L) 0.1 0.1    CBC Latest Ref Rng & Units 05/04/2016 01/24/2016 04/04/2015  WBC 3.6 - 11.0 K/uL 7.5 7.4 9.0  Hemoglobin 12.0 - 16.0 g/dL 13.6 14.9 -  Hematocrit 35.0 - 47.0 % 38.7 43.1 43.3  Platelets 150 - 440 K/uL 170 157 252   Lab Results  Component Value Date   MCV 91.0 05/04/2016   MCV 91.6 01/24/2016   MCV 90 04/04/2015   Lab Results  Component Value Date   TSH 1.77 02/25/2016   Lab Results  Component Value Date   HGBA1C 7.2 04/29/2016     BNP No results found for: BNP  ProBNP No results found for: PROBNP   Lipid Panel     Component Value Date/Time   CHOL 216 (H) 04/29/2016 1044   CHOL 229 (H) 05/14/2015 1443   TRIG 335 (H) 04/29/2016 1044   HDL 41 (L) 04/29/2016 1044   HDL 39 (L) 05/14/2015 1443   CHOLHDL 5.3 (H) 04/29/2016 1044   VLDL 67 (H) 04/29/2016 1044   LDLCALC 108 04/29/2016 1044   LDLCALC 142 (H) 05/14/2015 1443     RADIOLOGY: No results found.   Additional studies/ records that were reviewed today include:  Auditory, office evaluation of Dr.Shah; Charlotte Herrera ECG from November 2017 which showed normal sinus rhythm at 89 bpm without ectopy.    ASSESSMENT:    1. Essential hypertension   2. Uncontrolled type 2 diabetes mellitus with peripheral neuropathy ([place] Herrera)   3. Other specified hypothyroidism   4. Dyslipidemia   5. Transaminitis   6. H/O carotid endarterectomy   7. Fatty liver      PLAN:  Charlotte Herrera is a 71 year old female who is status post right carotid endarterectomy approximately 10-12 years ago after experiencing at least 3-4 TIAs dating back to 2004.  He has a history of hyperlipidemia, has been found to have elevation of liver enzymes and was told of having fatty liver on ultrasound.  Also recent lipid study from November 2017 revealed a total cholesterol 216 with  a triglycerides of 335 and elevation of VLDL at 67.  LDL was 108.  HDL at 41.  Charlotte Herrera lipid panel is consistent with an atherogenic dyslipidemic profile and I suspect she has increased small LDL particle number.  She is diabetic and has been started on metformin.  She has a hstory of hypertension and recently Charlotte Herrera blood pressure has been controlled on clonidine 0.1 mg, losartan HCT 50/12.5.  She has GERD is and is under excellent.  She is on antiplatelet therapy with clopidogrel 75 mg in follow-up of Charlotte Herrera carotid disease.  He is not had any recent carotid duplex imaging.  I have recommended a follow-up carotid Doppler study.  3 fatty acid with lovaza initially at 1 capsule twice a day, but this may need to be increased to 2 capsules twice a day.  Since Charlotte Herrera LFTs have been elevated I will not start Charlotte Herrera on statin therapy presently, but when last checked.  Frozen AST of 111 and ALT of 63, which I suspect is contributing by Charlotte Herrera hepatostearrhea.  I had a [place] discussion with Charlotte Herrera concerning the importance of improved diet, particularly with reference to carbohydrates.  We discussed the importance of weight loss and exercise.  Charlotte Herrera recent chest pain was atypical and nonexertional.  She denies any change in exercise tolerance.  With Charlotte Herrera cardiovascular comorbidities, hypertension and diabetes I am also scheduling Charlotte Herrera for 2-D echo Doppler study to evaluate both systolic and diastolic function.  Repeat laboratory will be obtained in the fasting state in approximately 6-8 weeks and I will see Charlotte Herrera in follow-up and at that time further recommendations will be made.   Medication Adjustments/Labs and Tests Ordered: Current medicines are reviewed at length with the patient today.  Concerns regarding medicines are outlined above.  Medication changes, Labs and Tests ordered today are listed in the Patient Instructions below. Patient Instructions  Medication Instructions:   START generic lovaza as directed on the bottle. This has been  sent to your pharmacy.  Labwork:  Have blood work done fasting in 2 months. Lab slips have been provided to you today.  Testing/Procedures:  Carotid doppler ordered.  Follow-Up:  3 months.  Any Other Special Instructions Will Be Listed Below (If Applicable).     Signed, Shelva Majestic, MD, [facility]  07/15/2016 8:31 PM    [facility] [address] [place], [place], [place]  [address] Phone: [phone]

## 2016-07-21 ENCOUNTER — Other Ambulatory Visit: Payer: Self-pay | Admitting: Family Medicine

## 2016-07-21 DIAGNOSIS — E1142 Type 2 diabetes mellitus with diabetic polyneuropathy: Secondary | ICD-10-CM

## 2016-07-21 DIAGNOSIS — E1165 Type 2 diabetes mellitus with hyperglycemia: Principal | ICD-10-CM

## 2016-07-21 DIAGNOSIS — IMO0002 Reserved for concepts with insufficient information to code with codable children: Secondary | ICD-10-CM

## 2016-07-27 ENCOUNTER — Telehealth: Payer: Self-pay | Admitting: Family Medicine

## 2016-07-27 NOTE — Telephone Encounter (Signed)
Pt is asking if you could give her more samples of xulthophy. She said that it cost her around 85.00 and that she can not afford it. She has not been giving any copay cards either. Her insurance says that this drug is a tier 4. Please advise.

## 2016-07-28 ENCOUNTER — Ambulatory Visit: Payer: PPO

## 2016-07-28 ENCOUNTER — Telehealth: Payer: Self-pay

## 2016-07-28 DIAGNOSIS — E1142 Type 2 diabetes mellitus with diabetic polyneuropathy: Secondary | ICD-10-CM

## 2016-07-28 DIAGNOSIS — E1165 Type 2 diabetes mellitus with hyperglycemia: Principal | ICD-10-CM

## 2016-07-28 DIAGNOSIS — IMO0002 Reserved for concepts with insufficient information to code with codable children: Secondary | ICD-10-CM

## 2016-07-28 DIAGNOSIS — Z9889 Other specified postprocedural states: Secondary | ICD-10-CM

## 2016-07-28 DIAGNOSIS — I6523 Occlusion and stenosis of bilateral carotid arteries: Secondary | ICD-10-CM | POA: Diagnosis not present

## 2016-07-28 MED ORDER — XULTOPHY 100-3.6 UNIT-MG/ML ~~LOC~~ SOPN: 50.0000 [IU] | PEN_INJECTOR | Freq: Every day | SUBCUTANEOUS | 2 refills | Status: DC

## 2016-07-28 NOTE — Telephone Encounter (Signed)
Medication has been refilled and sent to [facility] 

## 2016-07-29 NOTE — Telephone Encounter (Signed)
Patient's daughter came in office on yesterday and picked up sample of Xultophy and was notified that patient has been approved for medication and was also given a discount card and she needs to call pharmacy for next prescription

## 2016-07-30 ENCOUNTER — Other Ambulatory Visit: Payer: Self-pay | Admitting: Family Medicine

## 2016-07-30 DIAGNOSIS — Z8673 Personal history of transient ischemic attack (TIA), and cerebral infarction without residual deficits: Secondary | ICD-10-CM

## 2016-08-06 ENCOUNTER — Encounter: Payer: Self-pay | Admitting: *Deleted

## 2016-08-10 ENCOUNTER — Telehealth: Payer: Self-pay | Admitting: Family Medicine

## 2016-08-10 NOTE — Telephone Encounter (Signed)
Pt was given a prescription for

## 2016-08-10 NOTE — Telephone Encounter (Signed)
Was given a prescription for xultophy but even with the discount card it cost to much. Requesting another sample. States that she run out to fast due to it only having one pen in the sample box. Also states that she has one bottle of lantus at home but it may be old it has a date on it 01/07/16. Pt is completely out.

## 2016-08-13 NOTE — Telephone Encounter (Signed)
If medication is to expensive. Patient may get another sample to last until she make appointment to come in to discuss new treatment options. Called patient with NA

## 2016-08-18 ENCOUNTER — Encounter: Payer: Self-pay | Admitting: Family Medicine

## 2016-08-18 ENCOUNTER — Ambulatory Visit (INDEPENDENT_AMBULATORY_CARE_PROVIDER_SITE_OTHER): Payer: PPO | Admitting: Family Medicine

## 2016-08-18 VITALS — BP 114/64 | HR 99 | Temp 97.8°F | Resp 17 | Ht 64.0 in | Wt 167.6 lb

## 2016-08-18 DIAGNOSIS — J0100 Acute maxillary sinusitis, unspecified: Secondary | ICD-10-CM

## 2016-08-18 DIAGNOSIS — I10 Essential (primary) hypertension: Secondary | ICD-10-CM | POA: Diagnosis not present

## 2016-08-18 DIAGNOSIS — E1165 Type 2 diabetes mellitus with hyperglycemia: Secondary | ICD-10-CM

## 2016-08-18 DIAGNOSIS — E1142 Type 2 diabetes mellitus with diabetic polyneuropathy: Secondary | ICD-10-CM

## 2016-08-18 DIAGNOSIS — IMO0002 Reserved for concepts with insufficient information to code with codable children: Secondary | ICD-10-CM

## 2016-08-18 MED ORDER — METFORMIN HCL ER (OSM) 500 MG PO TB24: 500.0000 mg | ORAL_TABLET | Freq: Two times a day (BID) | ORAL | 1 refills | Status: DC

## 2016-08-18 MED ORDER — LOSARTAN POTASSIUM-HCTZ 50-12.5 MG PO TABS: 1.0000 | ORAL_TABLET | Freq: Every day | ORAL | 1 refills | Status: DC

## 2016-08-18 MED ORDER — AZITHROMYCIN 250 MG PO TABS: ORAL_TABLET | ORAL | 0 refills | Status: DC

## 2016-08-18 NOTE — Progress Notes (Signed)
Name: Charlotte Herrera   MRN: [id number]    DOB: 04/26/1946   Date:08/18/2016       Progress Note  Subjective  Chief Complaint  Chief Complaint  Patient presents with  . Acute Visit    Cough, headache, sore throat, earache x2 weeks    Sore Throat   This is a new problem. The current episode started 1 to 4 weeks ago (2 weeks ago). There has been no fever. Associated symptoms include coughing, ear pain (right ear pain) and headaches. Pertinent negatives include no trouble swallowing. Treatments tried: Mucinex 12 hour. The treatment provided mild relief.    Past Medical History:  Diagnosis Date  . Depression   . Enlarged liver   . Fibromyalgia affecting multiple sites   . GERD (gastroesophageal reflux disease)   . Hyperlipidemia   . Hypertension   . Stroke (HCC)   . Thyroid disease     Past Surgical History:  Procedure Laterality Date  . ABDOMINAL HYSTERECTOMY    . CARPAL TUNNEL RELEASE Left   . COLONOSCOPY  2015  . SPINE SURGERY    . VARICOSE VEIN SURGERY Left 1975    Family History  Problem Relation Age of Onset  . Cancer Mother   . Breast cancer Mother     40's  . Diabetes Father   . Hearing loss Father   . Cancer Father   . Diabetes Brother   . Cancer Brother   . Breast cancer Maternal Aunt     40's    Social History   Social History  . Marital status: Divorced    Spouse name: N/A  . Number of children: N/A  . Years of education: N/A   Occupational History  . Not on file.   Social History Main Topics  . Smoking status: Never Smoker  . Smokeless tobacco: Never Used  . Alcohol use No  . Drug use: No  . Sexual activity: No   Other Topics Concern  . Not on file   Social History Narrative  . No narrative on file     Current Outpatient Prescriptions:  .  allopurinol (ZYLOPRIM) 100 MG tablet, Take 1 tablet (100 mg total) by mouth 2 (two) times daily., Disp: 180 tablet, Rfl: 0 .  ALPRAZolam (XANAX) 0.5 MG tablet, Take 1 tablet (0.5 mg total) by mouth  2 (two) times daily as needed. for anxiety, Disp: 60 tablet, Rfl: 2 .  B-D UF III MINI PEN NEEDLES 31G X 5 MM MISC, use as directed at bedtime, Disp: 100 each, Rfl: 2 .  Blood Glucose Monitoring Suppl (ONE TOUCH ULTRA MINI) W/DEVICE KIT, 1 Device by Does not apply route 2 (two) times daily., Disp: 1 each, Rfl: 0 .  cetirizine (ZYRTEC) 10 MG tablet, Take 10 mg by mouth daily., Disp: , Rfl:  .  cloNIDine (CATAPRES) 0.1 MG tablet, Take 1 tablet (0.1 mg total) by mouth 2 (two) times daily. (Patient taking differently: Take 0.1 mg by mouth daily. ), Disp: 180 tablet, Rfl: 2 .  clopidogrel (PLAVIX) 75 MG tablet, TAKE 1 TABLET BY MOUTH DAILY, Disp: 90 tablet, Rfl: 0 .  DEXILANT 60 MG capsule, TAKE 1 CAPSULE BY MOUTH DAILY, Disp: 30 capsule, Rfl: 11 .  fluticasone (FLONASE) 50 MCG/ACT nasal spray, Place 2 sprays into both nostrils daily., Disp: 16 g, Rfl: 2 .  gabapentin (NEURONTIN) 300 MG capsule, TAKE 1 CAPSULE(300 MG) BY MOUTH AT BEDTIME, Disp: 90 capsule, Rfl: 0 .  ketoconazole (NIZORAL) 2 % cream,   Apply 1 application topically daily., Disp: 60 g, Rfl: 2 .  levothyroxine (SYNTHROID, LEVOTHROID) 50 MCG tablet, Take 1 tablet (50 mcg total) by mouth daily before breakfast., Disp: 90 tablet, Rfl: 1 .  losartan-hydrochlorothiazide (HYZAAR) 50-12.5 MG tablet, Take 1 tablet by mouth daily., Disp: 90 tablet, Rfl: 1 .  metformin (FORTAMET) 500 MG (OSM) 24 hr tablet, Take 1 tablet (500 mg total) by mouth 2 (two) times daily with a meal., Disp: 180 tablet, Rfl: 1 .  omega-3 acid ethyl esters (LOVAZA) 1 g capsule, Take 1 capsule (1 g total) by mouth 2 (two) times daily., Disp: 60 capsule, Rfl: 11 .  ONE TOUCH ULTRA TEST test strip, TEST twice a day, Disp: 100 each, Rfl: 11 .  ONETOUCH DELICA LANCETS 38V MISC, 33 g by Does not apply route 2 (two) times daily. Test twice a day as directed, Disp: 100 each, Rfl: 2 .  XULTOPHY 100-3.6 UNIT-MG/ML SOPN, Inject 50 Units into the skin at bedtime., Disp: 5 pen, Rfl:  2  Current Facility-Administered Medications:  .  triamcinolone acetonide (KENALOG) 10 MG/ML injection 10 mg, 10 mg, Other, Once, Landis Martins, DPM  Allergies  Allergen Reactions  . Aspirin   . Duloxetine Hcl   . Penicillins   . Codeine Nausea And Vomiting and Rash  . Lyrica [Pregabalin] Nausea And Vomiting  . Sulfa Antibiotics Swelling     Review of Systems  HENT: Positive for ear pain (right ear pain). Negative for trouble swallowing.   Respiratory: Positive for cough.   Neurological: Positive for headaches.     Objective  Vitals:   08/18/16 1030  BP: 114/64  Pulse: 99  Resp: 17  Temp: 97.8 F (36.6 C)  TempSrc: Oral  SpO2: 97%  Weight: 167 lb 9.6 oz (76 kg)  Height: 5' 4" (1.626 m)    Physical Exam  Constitutional: She is oriented to person, place, and time and well-developed, well-nourished, and in no distress.  HENT:  Head: Normocephalic and atraumatic.  Nose: Right sinus exhibits maxillary sinus tenderness. Right sinus exhibits no frontal sinus tenderness. Left sinus exhibits maxillary sinus tenderness. Left sinus exhibits no frontal sinus tenderness.  Mouth/Throat: No posterior oropharyngeal erythema.  Right ear canal with cerumen impaction, TM partially visible  Neurological: She is alert and oriented to person, place, and time.  Nursing note and vitals reviewed.    Assessment & Plan  1. Acute non-recurrent maxillary sinusitis Start on Azithromycin for treatment. - azithromycin (ZITHROMAX) 250 MG tablet; 2 tabs po day 1,then 1 tab po q day x 4 days  Dispense: 6 tablet; Refill: 0  2. Essential hypertension  - losartan-hydrochlorothiazide (HYZAAR) 50-12.5 MG tablet; Take 1 tablet by mouth daily.  Dispense: 90 tablet; Refill: 1  3. Uncontrolled type 2 diabetes mellitus with peripheral neuropathy ([place]) Samples for Xultophy provided, follow up for A1c. - metformin (FORTAMET) 500 MG (OSM) 24 hr tablet; Take 1 tablet (500 mg total) by mouth 2 (two)  times daily with a meal.  Dispense: 180 tablet; Refill: 1   Charlotte Herrera [facility] 08/18/2016 10:51 AM

## 2016-08-27 ENCOUNTER — Ambulatory Visit: Payer: PPO | Admitting: Family Medicine

## 2016-08-30 ENCOUNTER — Other Ambulatory Visit: Payer: Self-pay | Admitting: Family Medicine

## 2016-08-30 DIAGNOSIS — E038 Other specified hypothyroidism: Secondary | ICD-10-CM

## 2016-08-30 DIAGNOSIS — J0100 Acute maxillary sinusitis, unspecified: Secondary | ICD-10-CM

## 2016-09-01 DIAGNOSIS — M5431 Sciatica, right side: Secondary | ICD-10-CM | POA: Diagnosis not present

## 2016-09-01 DIAGNOSIS — M5136 Other intervertebral disc degeneration, lumbar region: Secondary | ICD-10-CM | POA: Diagnosis not present

## 2016-09-01 DIAGNOSIS — M9903 Segmental and somatic dysfunction of lumbar region: Secondary | ICD-10-CM | POA: Diagnosis not present

## 2016-09-01 DIAGNOSIS — M9905 Segmental and somatic dysfunction of pelvic region: Secondary | ICD-10-CM | POA: Diagnosis not present

## 2016-09-02 DIAGNOSIS — M5431 Sciatica, right side: Secondary | ICD-10-CM | POA: Diagnosis not present

## 2016-09-02 DIAGNOSIS — M5136 Other intervertebral disc degeneration, lumbar region: Secondary | ICD-10-CM | POA: Diagnosis not present

## 2016-09-02 DIAGNOSIS — M9903 Segmental and somatic dysfunction of lumbar region: Secondary | ICD-10-CM | POA: Diagnosis not present

## 2016-09-02 DIAGNOSIS — M9905 Segmental and somatic dysfunction of pelvic region: Secondary | ICD-10-CM | POA: Diagnosis not present

## 2016-09-04 ENCOUNTER — Other Ambulatory Visit: Payer: Self-pay | Admitting: Cardiovascular Disease

## 2016-09-04 DIAGNOSIS — E1165 Type 2 diabetes mellitus with hyperglycemia: Secondary | ICD-10-CM | POA: Diagnosis not present

## 2016-09-04 DIAGNOSIS — E785 Hyperlipidemia, unspecified: Secondary | ICD-10-CM | POA: Diagnosis not present

## 2016-09-04 DIAGNOSIS — I10 Essential (primary) hypertension: Secondary | ICD-10-CM | POA: Diagnosis not present

## 2016-09-04 DIAGNOSIS — E1142 Type 2 diabetes mellitus with diabetic polyneuropathy: Secondary | ICD-10-CM | POA: Diagnosis not present

## 2016-09-05 LAB — CBC WITH DIFFERENTIAL/PLATELET
Basophils Absolute: 0 10*3/uL (ref 0.0–0.2)
Basos: 0 %
EOS (ABSOLUTE): 0.2 10*3/uL (ref 0.0–0.4)
Eos: 3 %
Hematocrit: 40.8 % (ref 34.0–46.6)
Hemoglobin: 13.7 g/dL (ref 11.1–15.9)
Immature Grans (Abs): 0 10*3/uL (ref 0.0–0.1)
Immature Granulocytes: 0 %
Lymphocytes Absolute: 3.6 10*3/uL — ABNORMAL HIGH (ref 0.7–3.1)
Lymphs: 48 %
MCH: 30.7 pg (ref 26.6–33.0)
MCHC: 33.6 g/dL (ref 31.5–35.7)
MCV: 92 fL (ref 79–97)
Monocytes Absolute: 0.7 10*3/uL (ref 0.1–0.9)
Monocytes: 10 %
Neutrophils Absolute: 2.9 10*3/uL (ref 1.4–7.0)
Neutrophils: 39 %
Platelets: 172 10*3/uL (ref 150–379)
RBC: 4.46 x10E6/uL (ref 3.77–5.28)
RDW: 13.9 % (ref 12.3–15.4)
WBC: 7.5 10*3/uL (ref 3.4–10.8)

## 2016-09-05 LAB — COMPREHENSIVE METABOLIC PANEL
ALT: 34 IU/L — ABNORMAL HIGH (ref 0–32)
AST: 46 IU/L — ABNORMAL HIGH (ref 0–40)
Albumin/Globulin Ratio: 1.4 (ref 1.2–2.2)
Albumin: 4.2 g/dL (ref 3.5–4.8)
Alkaline Phosphatase: 58 IU/L (ref 39–117)
BUN/Creatinine Ratio: 16 (ref 12–28)
BUN: 15 mg/dL (ref 8–27)
Bilirubin Total: 0.4 mg/dL (ref 0.0–1.2)
CO2: 27 mmol/L (ref 18–29)
Calcium: 9.7 mg/dL (ref 8.7–10.3)
Chloride: 100 mmol/L (ref 96–106)
Creatinine, Ser: 0.94 mg/dL (ref 0.57–1.00)
GFR calc Af Amer: 71 mL/min/{1.73_m2} (ref >59)
GFR calc non Af Amer: 61 mL/min/{1.73_m2} (ref >59)
Globulin, Total: 3.1 g/dL (ref 1.5–4.5)
Glucose: 92 mg/dL (ref 65–99)
Potassium: 4.5 mmol/L (ref 3.5–5.2)
Sodium: 142 mmol/L (ref 134–144)
Total Protein: 7.3 g/dL (ref 6.0–8.5)

## 2016-09-05 LAB — LIPID PANEL W/O CHOL/HDL RATIO
Cholesterol, Total: 208 mg/dL — ABNORMAL HIGH (ref 100–199)
HDL: 43 mg/dL (ref >39)
LDL Calculated: 130 mg/dL — ABNORMAL HIGH (ref 0–99)
Triglycerides: 177 mg/dL — ABNORMAL HIGH (ref 0–149)
VLDL Cholesterol Cal: 35 mg/dL (ref 5–40)

## 2016-09-05 LAB — TSH: TSH: 1.17 u[IU]/mL (ref 0.450–4.500)

## 2016-09-05 LAB — HGB A1C W/O EAG: Hgb A1c MFr Bld: 6.8 % — ABNORMAL HIGH (ref 4.8–5.6)

## 2016-09-10 ENCOUNTER — Telehealth: Payer: Self-pay | Admitting: *Deleted

## 2016-09-10 NOTE — Telephone Encounter (Signed)
-----   Message from Troy Sine, MD sent at 09/06/2016  2:20 PM EDT ----- CBC normal; minimal AST/ALT elevation which is significantly improved from previously.;  Lipid studies increased.  She had not been started on a statin in the past due to LFT elevation.  Consider initial trial of Zetia 10 mg.  Needs improved diet.  Hemoglobin A1c elevated at 6.8.  Show primary M.D.

## 2016-09-10 NOTE — Telephone Encounter (Signed)
Left message to return a call to discuss lab results and recommendations.

## 2016-09-17 ENCOUNTER — Encounter: Payer: Self-pay | Admitting: Cardiovascular Disease

## 2016-09-22 ENCOUNTER — Ambulatory Visit: Payer: PPO | Admitting: Family Medicine

## 2016-09-29 ENCOUNTER — Ambulatory Visit (INDEPENDENT_AMBULATORY_CARE_PROVIDER_SITE_OTHER): Payer: PPO | Admitting: Family Medicine

## 2016-09-29 ENCOUNTER — Encounter: Payer: Self-pay | Admitting: Family Medicine

## 2016-09-29 VITALS — BP 112/70 | HR 108 | Temp 97.8°F | Resp 17 | Ht 64.0 in | Wt 172.8 lb

## 2016-09-29 DIAGNOSIS — E1165 Type 2 diabetes mellitus with hyperglycemia: Secondary | ICD-10-CM

## 2016-09-29 DIAGNOSIS — IMO0002 Reserved for concepts with insufficient information to code with codable children: Secondary | ICD-10-CM

## 2016-09-29 DIAGNOSIS — E1142 Type 2 diabetes mellitus with diabetic polyneuropathy: Secondary | ICD-10-CM | POA: Diagnosis not present

## 2016-09-29 DIAGNOSIS — F411 Generalized anxiety disorder: Secondary | ICD-10-CM | POA: Diagnosis not present

## 2016-09-29 MED ORDER — INSULIN GLARGINE 100 UNIT/ML SOLOSTAR PEN: 50.0000 [IU] | PEN_INJECTOR | Freq: Every day | SUBCUTANEOUS | 2 refills | Status: DC

## 2016-09-29 MED ORDER — METFORMIN HCL 500 MG PO TABS: 500.0000 mg | ORAL_TABLET | Freq: Two times a day (BID) | ORAL | 2 refills | Status: DC

## 2016-09-29 MED ORDER — ALPRAZOLAM 0.5 MG PO TABS: 0.5000 mg | ORAL_TABLET | Freq: Two times a day (BID) | ORAL | 2 refills | Status: DC | PRN

## 2016-09-29 NOTE — Progress Notes (Signed)
Name: Charlotte Herrera   MRN: [id number]    DOB: 08/03/1945   Date:09/29/2016       Progress Note  Subjective  Chief Complaint  Chief Complaint  Patient presents with  . Follow-up    3 mo  . Medication Refill    Diabetes  She presents for her follow-up diabetic visit. She has type 2 diabetes mellitus. Her disease course has been worsening (her blood glucose have been elevated following the increased amount of stress that she is going through after her daughter passed away). Hypoglycemia symptoms include nervousness/anxiousness. Associated symptoms include fatigue and foot paresthesias (some pain and numbness in feet). Pertinent negatives for diabetes include no chest pain, no polydipsia and no polyuria. Pertinent negatives for diabetic complications include no CVA (hx of TIA) or heart disease. Current diabetic treatment includes intensive insulin program and oral agent (monotherapy). Her weight is stable. She is following a diabetic diet. Her breakfast blood glucose range is generally 110-130 mg/dl. An ACE inhibitor/angiotensin II receptor blocker is being taken.  Anxiety  Presents for follow-up visit. Symptoms include depressed mood, excessive worry, insomnia and nervous/anxious behavior. Patient reports no chest pain. The severity of symptoms is moderate and causing significant distress.    Hyperlipidemia  This is a chronic problem. The problem is uncontrolled. Recent lipid tests were reviewed and are high. Pertinent negatives include no chest pain. Treatments tried: She was receiving a prescription fish oil from Dr. Claiborne Billings. Risk factors for coronary artery disease include diabetes mellitus, dyslipidemia and obesity.    Past Medical History:  Diagnosis Date  . Depression   . Enlarged liver   . Fibromyalgia affecting multiple sites   . GERD (gastroesophageal reflux disease)   . Hyperlipidemia   . Hypertension   . Stroke ([place])   . Thyroid disease     Past Surgical History:  Procedure  Laterality Date  . ABDOMINAL HYSTERECTOMY    . CARPAL TUNNEL RELEASE Left   . COLONOSCOPY  2015  . SPINE SURGERY    . VARICOSE VEIN SURGERY Left 1975    Family History  Problem Relation Age of Onset  . Cancer Mother   . Breast cancer Mother     35's  . Diabetes Father   . Hearing loss Father   . Cancer Father   . Diabetes Brother   . Cancer Brother   . Breast cancer Maternal Aunt     40's    Social History   Social History  . Marital status: Divorced    Spouse name: N/A  . Number of children: N/A  . Years of education: N/A   Occupational History  . Not on file.   Social History Main Topics  . Smoking status: Never Smoker  . Smokeless tobacco: Never Used  . Alcohol use No  . Drug use: No  . Sexual activity: No   Other Topics Concern  . Not on file   Social History Narrative  . No narrative on file     Current Outpatient Prescriptions:  .  allopurinol (ZYLOPRIM) 100 MG tablet, Take 1 tablet (100 mg total) by mouth 2 (two) times daily., Disp: 180 tablet, Rfl: 0 .  ALPRAZolam (XANAX) 0.5 MG tablet, Take 1 tablet (0.5 mg total) by mouth 2 (two) times daily as needed. for anxiety, Disp: 60 tablet, Rfl: 2 .  B-D UF III MINI PEN NEEDLES 31G X 5 MM MISC, use as directed at bedtime, Disp: 100 each, Rfl: 2 .  Blood Glucose Monitoring Suppl (  ONE TOUCH ULTRA MINI) W/DEVICE KIT, 1 Device by Does not apply route 2 (two) times daily., Disp: 1 each, Rfl: 0 .  cetirizine (ZYRTEC) 10 MG tablet, Take 10 mg by mouth daily., Disp: , Rfl:  .  cloNIDine (CATAPRES) 0.1 MG tablet, Take 1 tablet (0.1 mg total) by mouth 2 (two) times daily. (Patient taking differently: Take 0.1 mg by mouth daily. ), Disp: 180 tablet, Rfl: 2 .  clopidogrel (PLAVIX) 75 MG tablet, TAKE 1 TABLET BY MOUTH DAILY, Disp: 90 tablet, Rfl: 0 .  DEXILANT 60 MG capsule, TAKE 1 CAPSULE BY MOUTH DAILY, Disp: 30 capsule, Rfl: 11 .  fluticasone (FLONASE) 50 MCG/ACT nasal spray, Place 2 sprays into both nostrils daily.,  Disp: 16 g, Rfl: 2 .  fluticasone (FLONASE) 50 MCG/ACT nasal spray, SHAKE LIQUID AND USE 2 SPRAYS IN EACH NOSTRIL DAILY, Disp: 16 g, Rfl: 0 .  gabapentin (NEURONTIN) 300 MG capsule, TAKE 1 CAPSULE(300 MG) BY MOUTH AT BEDTIME, Disp: 90 capsule, Rfl: 0 .  ketoconazole (NIZORAL) 2 % cream, Apply 1 application topically daily., Disp: 60 g, Rfl: 2 .  levothyroxine (SYNTHROID, LEVOTHROID) 50 MCG tablet, TAKE 1 TABLET(50 MCG) BY MOUTH DAILY BEFORE BREAKFAST, Disp: 90 tablet, Rfl: 0 .  losartan-hydrochlorothiazide (HYZAAR) 50-12.5 MG tablet, Take 1 tablet by mouth daily., Disp: 90 tablet, Rfl: 1 .  metformin (FORTAMET) 500 MG (OSM) 24 hr tablet, Take 1 tablet (500 mg total) by mouth 2 (two) times daily with a meal., Disp: 180 tablet, Rfl: 1 .  omega-3 acid ethyl esters (LOVAZA) 1 g capsule, Take 1 capsule (1 g total) by mouth 2 (two) times daily., Disp: 60 capsule, Rfl: 11 .  ONE TOUCH ULTRA TEST test strip, TEST twice a day, Disp: 100 each, Rfl: 11 .  ONETOUCH DELICA LANCETS 55H MISC, 33 g by Does not apply route 2 (two) times daily. Test twice a day as directed, Disp: 100 each, Rfl: 2 .  XULTOPHY 100-3.6 UNIT-MG/ML SOPN, Inject 50 Units into the skin at bedtime., Disp: 5 pen, Rfl: 2 .  azithromycin (ZITHROMAX) 250 MG tablet, 2 tabs po day 1,then 1 tab po q day x 4 days (Patient not taking: Reported on 09/29/2016), Disp: 6 tablet, Rfl: 0  Current Facility-Administered Medications:  .  triamcinolone acetonide (KENALOG) 10 MG/ML injection 10 mg, 10 mg, Other, Once, Landis Martins, DPM  Allergies  Allergen Reactions  . Aspirin   . Duloxetine Hcl   . Penicillins   . Codeine Nausea And Vomiting and Rash  . Lyrica [Pregabalin] Nausea And Vomiting  . Sulfa Antibiotics Swelling     Review of Systems  Constitutional: Positive for fatigue.  Cardiovascular: Negative for chest pain.  Endo/Heme/Allergies: Negative for polydipsia.  Psychiatric/Behavioral: The patient is nervous/anxious and has insomnia.       Objective  Vitals:   09/29/16 1525  BP: 112/70  Pulse: (!) 108  Resp: 17  Temp: 97.8 F (36.6 C)  TempSrc: Oral  SpO2: 96%  Weight: 172 lb 12.8 oz (78.4 kg)  Height: _0  (1.626 m)    Physical Exam  Constitutional: She is oriented to person, place, and time and well-developed, well-nourished, and in no distress.  HENT:  Head: Normocephalic and atraumatic.  Cardiovascular: Normal rate, regular rhythm and normal heart sounds.   No murmur heard. Pulmonary/Chest: Effort normal and breath sounds normal. No respiratory distress. She has no wheezes.  Abdominal: Soft. Bowel sounds are normal. There is no tenderness.  Neurological: She is alert and oriented to  person, place, and time.  Psychiatric: Mood, memory, affect and judgment normal.  Nursing note and vitals reviewed.        Assessment & Plan  1. Generalized anxiety disorder Continue on alprazolam as prescribed - ALPRAZolam (XANAX) 0.5 MG tablet; Take 1 tablet (0.5 mg total) by mouth 2 (two) times daily as needed. for anxiety  Dispense: 60 tablet; Refill: 2  2. Uncontrolled type 2 diabetes mellitus with peripheral neuropathy ([place]) DC Xultophy and started on insulin glargine 50 units, change metformin to regular release increase to twice a day - metFORMIN (GLUCOPHAGE) 500 MG tablet; Take 1 tablet (500 mg total) by mouth 2 (two) times daily with a meal.  Dispense: 60 tablet; Refill: 2 - Insulin Glargine (LANTUS SOLOSTAR) 100 UNIT/ML Solostar Pen; Inject 50 Units into the skin daily at 10 pm.  Dispense: 5 pen; Refill: 2   [facility] [facility] 09/29/2016 3:48 PM

## 2016-10-06 DIAGNOSIS — M9905 Segmental and somatic dysfunction of pelvic region: Secondary | ICD-10-CM | POA: Diagnosis not present

## 2016-10-06 DIAGNOSIS — M5136 Other intervertebral disc degeneration, lumbar region: Secondary | ICD-10-CM | POA: Diagnosis not present

## 2016-10-06 DIAGNOSIS — M9903 Segmental and somatic dysfunction of lumbar region: Secondary | ICD-10-CM | POA: Diagnosis not present

## 2016-10-06 DIAGNOSIS — M5431 Sciatica, right side: Secondary | ICD-10-CM | POA: Diagnosis not present

## 2016-10-07 DIAGNOSIS — M9903 Segmental and somatic dysfunction of lumbar region: Secondary | ICD-10-CM | POA: Diagnosis not present

## 2016-10-07 DIAGNOSIS — M5136 Other intervertebral disc degeneration, lumbar region: Secondary | ICD-10-CM | POA: Diagnosis not present

## 2016-10-07 DIAGNOSIS — M5431 Sciatica, right side: Secondary | ICD-10-CM | POA: Diagnosis not present

## 2016-10-07 DIAGNOSIS — M9905 Segmental and somatic dysfunction of pelvic region: Secondary | ICD-10-CM | POA: Diagnosis not present

## 2016-10-09 DIAGNOSIS — M9903 Segmental and somatic dysfunction of lumbar region: Secondary | ICD-10-CM | POA: Diagnosis not present

## 2016-10-09 DIAGNOSIS — M5136 Other intervertebral disc degeneration, lumbar region: Secondary | ICD-10-CM | POA: Diagnosis not present

## 2016-10-09 DIAGNOSIS — M9905 Segmental and somatic dysfunction of pelvic region: Secondary | ICD-10-CM | POA: Diagnosis not present

## 2016-10-09 DIAGNOSIS — M5431 Sciatica, right side: Secondary | ICD-10-CM | POA: Diagnosis not present

## 2016-10-12 DIAGNOSIS — M9905 Segmental and somatic dysfunction of pelvic region: Secondary | ICD-10-CM | POA: Diagnosis not present

## 2016-10-12 DIAGNOSIS — M9903 Segmental and somatic dysfunction of lumbar region: Secondary | ICD-10-CM | POA: Diagnosis not present

## 2016-10-12 DIAGNOSIS — M5136 Other intervertebral disc degeneration, lumbar region: Secondary | ICD-10-CM | POA: Diagnosis not present

## 2016-10-12 DIAGNOSIS — M5431 Sciatica, right side: Secondary | ICD-10-CM | POA: Diagnosis not present

## 2016-10-27 DIAGNOSIS — M5136 Other intervertebral disc degeneration, lumbar region: Secondary | ICD-10-CM | POA: Diagnosis not present

## 2016-10-27 DIAGNOSIS — M9903 Segmental and somatic dysfunction of lumbar region: Secondary | ICD-10-CM | POA: Diagnosis not present

## 2016-10-27 DIAGNOSIS — M5431 Sciatica, right side: Secondary | ICD-10-CM | POA: Diagnosis not present

## 2016-10-27 DIAGNOSIS — M9905 Segmental and somatic dysfunction of pelvic region: Secondary | ICD-10-CM | POA: Diagnosis not present

## 2016-11-03 ENCOUNTER — Encounter: Payer: Self-pay | Admitting: Family Medicine

## 2016-11-03 ENCOUNTER — Ambulatory Visit (INDEPENDENT_AMBULATORY_CARE_PROVIDER_SITE_OTHER): Payer: PPO | Admitting: Family Medicine

## 2016-11-03 VITALS — BP 114/76 | HR 93 | Temp 97.7°F | Resp 16 | Ht 64.0 in | Wt 172.8 lb

## 2016-11-03 DIAGNOSIS — Z1211 Encounter for screening for malignant neoplasm of colon: Secondary | ICD-10-CM

## 2016-11-03 DIAGNOSIS — Z1231 Encounter for screening mammogram for malignant neoplasm of breast: Secondary | ICD-10-CM

## 2016-11-03 DIAGNOSIS — Z0000 Encounter for general adult medical examination without abnormal findings: Secondary | ICD-10-CM

## 2016-11-03 DIAGNOSIS — Z13820 Encounter for screening for osteoporosis: Secondary | ICD-10-CM | POA: Diagnosis not present

## 2016-11-03 DIAGNOSIS — Z1239 Encounter for other screening for malignant neoplasm of breast: Secondary | ICD-10-CM

## 2016-11-03 DIAGNOSIS — Z1159 Encounter for screening for other viral diseases: Secondary | ICD-10-CM | POA: Diagnosis not present

## 2016-11-03 DIAGNOSIS — E559 Vitamin D deficiency, unspecified: Secondary | ICD-10-CM | POA: Diagnosis not present

## 2016-11-03 DIAGNOSIS — B372 Candidiasis of skin and nail: Secondary | ICD-10-CM

## 2016-11-03 LAB — HEPATITIS C ANTIBODY: HCV Ab: NEGATIVE

## 2016-11-03 MED ORDER — KETOCONAZOLE 2 % EX CREA: 1.0000 "application " | TOPICAL_CREAM | Freq: Every day | CUTANEOUS | 2 refills | Status: DC

## 2016-11-03 NOTE — Progress Notes (Signed)
Name: Charlotte Herrera   MRN: [id number]    DOB: [birth year]   Date:11/03/2016       Progress Note  Subjective  Chief Complaint  Chief Complaint  Patient presents with  . Annual Exam    CPE    HPI  Patient presents for Complete Physical Exam.  No records available of last colonoscopy, but patient reports it was done by Dr. Allen Norris in [place].  She is due for DEXA Scan. She is due for screening mammogram.  She is also requesting a refill for ketoconazole cream that she applies underneath her breasts and in the groin for candidal intertrigo, no rash at this time but she would like to have a prescription on file in case of an outbreak. Past Medical History:  Diagnosis Date  . Depression   . Enlarged liver   . Fibromyalgia affecting multiple sites   . GERD (gastroesophageal reflux disease)   . Hyperlipidemia   . Hypertension   . Stroke ([place])   . Thyroid disease     Past Surgical History:  Procedure Laterality Date  . ABDOMINAL HYSTERECTOMY    . CARPAL TUNNEL RELEASE Left   . COLONOSCOPY  2015  . SPINE SURGERY    . VARICOSE VEIN SURGERY Left 1975    Family History  Problem Relation Age of Onset  . Cancer Mother   . Breast cancer Mother     66's  . Diabetes Father   . Hearing loss Father   . Cancer Father   . Diabetes Brother   . Cancer Brother   . Breast cancer Maternal Aunt     40's    Social History   Social History  . Marital status: Divorced    Spouse name: N/A  . Number of children: N/A  . Years of education: N/A   Occupational History  . Not on file.   Social History Main Topics  . Smoking status: Never Smoker  . Smokeless tobacco: Never Used  . Alcohol use No  . Drug use: No  . Sexual activity: No   Other Topics Concern  . Not on file   Social History Narrative  . No narrative on file     Current Outpatient Prescriptions:  .  allopurinol (ZYLOPRIM) 100 MG tablet, Take 1 tablet (100 mg total) by mouth 2 (two) times daily., Disp: 180 tablet, Rfl:  0 .  ALPRAZolam (XANAX) 0.5 MG tablet, Take 1 tablet (0.5 mg total) by mouth 2 (two) times daily as needed. for anxiety, Disp: 60 tablet, Rfl: 2 .  B-D UF III MINI PEN NEEDLES 31G X 5 MM MISC, use as directed at bedtime, Disp: 100 each, Rfl: 2 .  Blood Glucose Monitoring Suppl (ONE TOUCH ULTRA MINI) W/DEVICE KIT, 1 Device by Does not apply route 2 (two) times daily., Disp: 1 each, Rfl: 0 .  cetirizine (ZYRTEC) 10 MG tablet, Take 10 mg by mouth daily., Disp: , Rfl:  .  cloNIDine (CATAPRES) 0.1 MG tablet, Take 1 tablet (0.1 mg total) by mouth 2 (two) times daily. (Patient taking differently: Take 0.1 mg by mouth daily. ), Disp: 180 tablet, Rfl: 2 .  clopidogrel (PLAVIX) 75 MG tablet, TAKE 1 TABLET BY MOUTH DAILY, Disp: 90 tablet, Rfl: 0 .  DEXILANT 60 MG capsule, TAKE 1 CAPSULE BY MOUTH DAILY, Disp: 30 capsule, Rfl: 11 .  fluticasone (FLONASE) 50 MCG/ACT nasal spray, Place 2 sprays into both nostrils daily., Disp: 16 g, Rfl: 2 .  fluticasone (FLONASE) 50 MCG/ACT nasal  spray, SHAKE LIQUID AND USE 2 SPRAYS IN EACH NOSTRIL DAILY, Disp: 16 g, Rfl: 0 .  gabapentin (NEURONTIN) 300 MG capsule, TAKE 1 CAPSULE(300 MG) BY MOUTH AT BEDTIME, Disp: 90 capsule, Rfl: 0 .  Insulin Glargine (LANTUS SOLOSTAR) 100 UNIT/ML Solostar Pen, Inject 50 Units into the skin daily at 10 pm., Disp: 5 pen, Rfl: 2 .  ketoconazole (NIZORAL) 2 % cream, Apply 1 application topically daily., Disp: 60 g, Rfl: 2 .  levothyroxine (SYNTHROID, LEVOTHROID) 50 MCG tablet, TAKE 1 TABLET(50 MCG) BY MOUTH DAILY BEFORE BREAKFAST, Disp: 90 tablet, Rfl: 0 .  losartan-hydrochlorothiazide (HYZAAR) 50-12.5 MG tablet, Take 1 tablet by mouth daily., Disp: 90 tablet, Rfl: 1 .  metFORMIN (GLUCOPHAGE) 500 MG tablet, Take 1 tablet (500 mg total) by mouth 2 (two) times daily with a meal., Disp: 60 tablet, Rfl: 2 .  omega-3 acid ethyl esters (LOVAZA) 1 g capsule, Take 1 capsule (1 g total) by mouth 2 (two) times daily., Disp: 60 capsule, Rfl: 11 .  ONE TOUCH  ULTRA TEST test strip, TEST twice a day, Disp: 100 each, Rfl: 11 .  ONETOUCH DELICA LANCETS 65K MISC, 33 g by Does not apply route 2 (two) times daily. Test twice a day as directed, Disp: 100 each, Rfl: 2 .  azithromycin (ZITHROMAX) 250 MG tablet, 2 tabs po day 1,then 1 tab po q day x 4 days (Patient not taking: Reported on 09/29/2016), Disp: 6 tablet, Rfl: 0  Current Facility-Administered Medications:  .  triamcinolone acetonide (KENALOG) 10 MG/ML injection 10 mg, 10 mg, Other, Once, Landis Martins, DPM  Allergies  Allergen Reactions  . Aspirin   . Duloxetine Hcl   . Penicillins   . Codeine Nausea And Vomiting and Rash  . Lyrica [Pregabalin] Nausea And Vomiting  . Sulfa Antibiotics Swelling     Review of Systems  Constitutional: Positive for malaise/fatigue. Negative for chills and fever.  HENT: Positive for tinnitus ('fluttering' in right ear). Negative for congestion, ear pain and sore throat.   Eyes: Positive for blurred vision. Negative for double vision.  Respiratory: Negative for cough, sputum production and shortness of breath.   Cardiovascular: Negative for chest pain, palpitations and leg swelling.  Gastrointestinal: Positive for abdominal pain (attributes it to gas pains) and diarrhea (has been experiencing diarrhea). Negative for blood in stool.  Genitourinary: Negative for dysuria and hematuria.  Musculoskeletal: Positive for back pain. Negative for neck pain (sees chiropractor).  Neurological: Negative for dizziness and headaches.  Psychiatric/Behavioral: Negative for depression. The patient is not nervous/anxious and does not have insomnia.      Objective  Vitals:   11/03/16 1043  BP: 114/76  Pulse: 93  Resp: 16  Temp: 97.7 F (36.5 C)  TempSrc: Oral  SpO2: 97%  Weight: 172 lb 12.8 oz (78.4 kg)  Height: 5' 4"  (1.626 m)    Physical Exam  Constitutional: She is oriented to person, place, and time and well-developed, well-nourished, and in no distress.   HENT:  Head: Normocephalic and atraumatic.  Cardiovascular: Normal rate, regular rhythm, S1 normal and S2 normal.   No murmur heard. Pulmonary/Chest: Effort normal and breath sounds normal. No respiratory distress. She has no wheezes. She has no rhonchi.  Abdominal: Soft. Bowel sounds are normal. There is no tenderness.  Musculoskeletal:       Right ankle: Tenderness.       Left ankle: Tenderness.  Neurological: She is alert and oriented to person, place, and time.  Skin: Skin is warm, dry  and intact.  Psychiatric: Memory, affect and judgment normal. Her mood appears not anxious. She exhibits a depressed mood.  Nursing note and vitals reviewed.    Assessment & Plan  1. Well woman exam (no gynecological exam)  - VITAMIN D 25 Hydroxy (Vit-D Deficiency, Fractures)  2. Screening for breast cancer  - MM Digital Screening; Future  3. Screening for colon cancer  - Cologuard  4. Screening for osteoporosis  - DG Bone Density; Future  5. Need for hepatitis C screening test  - Hepatitis C antibody  6. Candidal intertrigo  - ketoconazole (NIZORAL) 2 % cream; Apply 1 application topically daily.  Dispense: 60 g; Refill: 2   Zyaira Vejar [facility] 11/03/2016 11:09 AM

## 2016-11-04 ENCOUNTER — Telehealth: Payer: Self-pay

## 2016-11-04 LAB — VITAMIN D 25 HYDROXY (VIT D DEFICIENCY, FRACTURES): Vit D, 25-Hydroxy: 24 ng/mL — ABNORMAL LOW (ref 30–100)

## 2016-11-04 MED ORDER — VITAMIN D (ERGOCALCIFEROL) 1.25 MG (50000 UNIT) PO CAPS: 50000.0000 [IU] | ORAL_CAPSULE | ORAL | 0 refills | Status: DC

## 2016-11-04 NOTE — Telephone Encounter (Signed)
Patient has been notified of lab results and a prescription for vitamin D3 50,000 units take 1 capsule once a week x12 weeks has been sent to [facility] per Dr. Manuella Ghazi, patient has been notified and verbalized understanding

## 2016-11-08 ENCOUNTER — Other Ambulatory Visit: Payer: Self-pay | Admitting: Family Medicine

## 2016-11-08 DIAGNOSIS — Z8673 Personal history of transient ischemic attack (TIA), and cerebral infarction without residual deficits: Secondary | ICD-10-CM

## 2016-11-17 DIAGNOSIS — Z1212 Encounter for screening for malignant neoplasm of rectum: Secondary | ICD-10-CM | POA: Diagnosis not present

## 2016-11-17 DIAGNOSIS — Z1211 Encounter for screening for malignant neoplasm of colon: Secondary | ICD-10-CM | POA: Diagnosis not present

## 2016-11-26 LAB — COLOGUARD

## 2016-11-27 DIAGNOSIS — M722 Plantar fascial fibromatosis: Secondary | ICD-10-CM | POA: Diagnosis not present

## 2016-11-29 ENCOUNTER — Other Ambulatory Visit: Payer: Self-pay | Admitting: Family Medicine

## 2016-11-29 DIAGNOSIS — E038 Other specified hypothyroidism: Secondary | ICD-10-CM

## 2016-12-01 ENCOUNTER — Telehealth: Payer: Self-pay | Admitting: Family Medicine

## 2016-12-01 NOTE — Telephone Encounter (Signed)
PT NEEDS REFILLS ON HER XANAX. SHE IS ALSO WANTING TO KNOW IF THE RESULTS OF THE COLO GUARD IS BACK. Wants someone to call her back.

## 2016-12-02 ENCOUNTER — Telehealth: Payer: Self-pay | Admitting: Family Medicine

## 2016-12-02 ENCOUNTER — Other Ambulatory Visit: Payer: Self-pay | Admitting: Emergency Medicine

## 2016-12-02 DIAGNOSIS — F411 Generalized anxiety disorder: Secondary | ICD-10-CM

## 2016-12-02 MED ORDER — ALPRAZOLAM 0.5 MG PO TABS: 0.5000 mg | ORAL_TABLET | Freq: Two times a day (BID) | ORAL | 0 refills | Status: DC | PRN

## 2016-12-02 NOTE — Telephone Encounter (Signed)
IN THE MESSAGE ABOUT MEDICATION THERE WAS A QUESTION ALSO ABOUT THE COLOGUARD RESULTS . SHE WANT AND NEEDS SOMEONE TO CALL HER ABOUT THESE.

## 2016-12-02 NOTE — Telephone Encounter (Signed)
Script ready for pick up 

## 2016-12-02 NOTE — Telephone Encounter (Signed)
Patient notified negative results

## 2016-12-08 ENCOUNTER — Ambulatory Visit: Payer: PPO | Admitting: Family Medicine

## 2016-12-13 ENCOUNTER — Other Ambulatory Visit: Payer: Self-pay | Admitting: Family Medicine

## 2016-12-13 DIAGNOSIS — M1A9XX0 Chronic gout, unspecified, without tophus (tophi): Secondary | ICD-10-CM

## 2016-12-13 DIAGNOSIS — I10 Essential (primary) hypertension: Secondary | ICD-10-CM

## 2016-12-24 ENCOUNTER — Other Ambulatory Visit: Payer: Self-pay | Admitting: Family Medicine

## 2016-12-24 DIAGNOSIS — E1165 Type 2 diabetes mellitus with hyperglycemia: Principal | ICD-10-CM

## 2016-12-24 DIAGNOSIS — E1142 Type 2 diabetes mellitus with diabetic polyneuropathy: Secondary | ICD-10-CM

## 2016-12-24 DIAGNOSIS — IMO0002 Reserved for concepts with insufficient information to code with codable children: Secondary | ICD-10-CM

## 2016-12-25 ENCOUNTER — Other Ambulatory Visit: Payer: Self-pay | Admitting: Family Medicine

## 2016-12-25 DIAGNOSIS — IMO0002 Reserved for concepts with insufficient information to code with codable children: Secondary | ICD-10-CM

## 2016-12-25 DIAGNOSIS — E1142 Type 2 diabetes mellitus with diabetic polyneuropathy: Secondary | ICD-10-CM

## 2016-12-25 DIAGNOSIS — E1165 Type 2 diabetes mellitus with hyperglycemia: Principal | ICD-10-CM

## 2016-12-29 ENCOUNTER — Ambulatory Visit
Admission: RE | Admit: 2016-12-29 | Discharge: 2016-12-29 | Disposition: A | Discharge status: Home / Self Care | Payer: PPO | Source: Ambulatory Visit | Attending: Family Medicine | Admitting: Family Medicine

## 2016-12-29 ENCOUNTER — Ambulatory Visit (INDEPENDENT_AMBULATORY_CARE_PROVIDER_SITE_OTHER): Payer: PPO | Admitting: Family Medicine

## 2016-12-29 VITALS — BP 112/74 | HR 91 | Temp 97.9°F | Resp 16 | Ht 64.0 in | Wt 172.6 lb

## 2016-12-29 DIAGNOSIS — E1142 Type 2 diabetes mellitus with diabetic polyneuropathy: Secondary | ICD-10-CM

## 2016-12-29 DIAGNOSIS — Z13820 Encounter for screening for osteoporosis: Secondary | ICD-10-CM | POA: Insufficient documentation

## 2016-12-29 DIAGNOSIS — E1165 Type 2 diabetes mellitus with hyperglycemia: Secondary | ICD-10-CM | POA: Diagnosis not present

## 2016-12-29 DIAGNOSIS — E2839 Other primary ovarian failure: Secondary | ICD-10-CM | POA: Diagnosis not present

## 2016-12-29 DIAGNOSIS — Z1231 Encounter for screening mammogram for malignant neoplasm of breast: Secondary | ICD-10-CM | POA: Insufficient documentation

## 2016-12-29 DIAGNOSIS — I10 Essential (primary) hypertension: Secondary | ICD-10-CM

## 2016-12-29 DIAGNOSIS — E782 Mixed hyperlipidemia: Secondary | ICD-10-CM

## 2016-12-29 DIAGNOSIS — IMO0002 Reserved for concepts with insufficient information to code with codable children: Secondary | ICD-10-CM

## 2016-12-29 DIAGNOSIS — Z1239 Encounter for other screening for malignant neoplasm of breast: Secondary | ICD-10-CM

## 2016-12-29 LAB — HM MAMMOGRAPHY

## 2016-12-29 LAB — POCT GLYCOSYLATED HEMOGLOBIN (HGB A1C): Hemoglobin A1C: 10.4

## 2016-12-29 MED ORDER — INSULIN DEGLUDEC-LIRAGLUTIDE 100-3.6 UNIT-MG/ML ~~LOC~~ SOPN: 16.0000 [IU] | PEN_INJECTOR | Freq: Every day | SUBCUTANEOUS | 0 refills | Status: AC

## 2016-12-29 MED ORDER — LOSARTAN POTASSIUM-HCTZ 50-12.5 MG PO TABS: 1.0000 | ORAL_TABLET | Freq: Every day | ORAL | 1 refills | Status: DC

## 2016-12-29 MED ORDER — METFORMIN HCL 500 MG PO TABS: 500.0000 mg | ORAL_TABLET | Freq: Two times a day (BID) | ORAL | 2 refills | Status: DC

## 2016-12-29 NOTE — Progress Notes (Signed)
Name: Charlotte Herrera   MRN: [id number]    DOB: 1946-05-07   Date:12/29/2016       Progress Note  Subjective  Chief Complaint  Chief Complaint  Patient presents with  . Medication Refill    Diabetes  She presents for her follow-up diabetic visit. She has type 2 diabetes mellitus. Her disease course has been worsening. There are no hypoglycemic associated symptoms. Pertinent negatives for hypoglycemia include no headaches. Associated symptoms include foot paresthesias, polydipsia and polyuria. Pertinent negatives for diabetes include no blurred vision, no chest pain and no fatigue. Diabetic complications include a CVA and peripheral neuropathy. Pertinent negatives for diabetic complications include no heart disease. Current diabetic treatment includes intensive insulin program and oral agent (monotherapy). She is following a generally healthy diet. She has had a previous visit with a dietitian. She participates in exercise daily. She monitors blood glucose at home 1-2 x per day. Her breakfast blood glucose range is generally >200 mg/dl. An ACE inhibitor/angiotensin II receptor blocker is being taken. Eye exam is current.  Hyperlipidemia  This is a chronic problem. The problem is uncontrolled. Recent lipid tests were reviewed and are high. Pertinent negatives include no chest pain. She is currently on no antihyperlipidemic treatment.  Hypertension  This is a chronic problem. The problem is unchanged. The problem is controlled. Pertinent negatives include no blurred vision, chest pain, headaches or orthopnea. Past treatments include angiotensin blockers, diuretics and central alpha agonists. Hypertensive end-organ damage includes CVA.     Past Medical History:  Diagnosis Date  . Depression   . Enlarged liver   . Fibromyalgia affecting multiple sites   . GERD (gastroesophageal reflux disease)   . Hyperlipidemia   . Hypertension   . Stroke ([place])   . Thyroid disease     Past Surgical History:   Procedure Laterality Date  . ABDOMINAL HYSTERECTOMY    . CARPAL TUNNEL RELEASE Left   . COLONOSCOPY  2015  . SPINE SURGERY    . VARICOSE VEIN SURGERY Left 1975    Family History  Problem Relation Age of Onset  . Cancer Mother   . Breast cancer Mother        33's  . Diabetes Father   . Hearing loss Father   . Cancer Father   . Diabetes Brother   . Cancer Brother   . Breast cancer Maternal Aunt        40's    Social History   Social History  . Marital status: Divorced    Spouse name: N/A  . Number of children: N/A  . Years of education: N/A   Occupational History  . Not on file.   Social History Main Topics  . Smoking status: Never Smoker  . Smokeless tobacco: Never Used  . Alcohol use No  . Drug use: No  . Sexual activity: No   Other Topics Concern  . Not on file   Social History Narrative  . No narrative on file     Current Outpatient Prescriptions:  .  allopurinol (ZYLOPRIM) 100 MG tablet, Take 1 tablet (100 mg total) by mouth 2 (two) times daily., Disp: 180 tablet, Rfl: 0 .  allopurinol (ZYLOPRIM) 100 MG tablet, TAKE 1 TABLET(100 MG) BY MOUTH TWICE DAILY, Disp: 180 tablet, Rfl: 0 .  ALPRAZolam (XANAX) 0.5 MG tablet, Take 1 tablet (0.5 mg total) by mouth 2 (two) times daily as needed. for anxiety, Disp: 60 tablet, Rfl: 0 .  azithromycin (ZITHROMAX) 250 MG tablet, 2  tabs po day 1,then 1 tab po q day x 4 days (Patient not taking: Reported on 09/29/2016), Disp: 6 tablet, Rfl: 0 .  B-D UF III MINI PEN NEEDLES 31G X 5 MM MISC, use as directed at bedtime, Disp: 100 each, Rfl: 2 .  Blood Glucose Monitoring Suppl (ONE TOUCH ULTRA MINI) W/DEVICE KIT, 1 Device by Does not apply route 2 (two) times daily., Disp: 1 each, Rfl: 0 .  cetirizine (ZYRTEC) 10 MG tablet, Take 10 mg by mouth daily., Disp: , Rfl:  .  cloNIDine (CATAPRES) 0.1 MG tablet, TAKE 1 TABLET(0.1 MG) BY MOUTH TWICE DAILY, Disp: 180 tablet, Rfl: 0 .  clopidogrel (PLAVIX) 75 MG tablet, TAKE 1 TABLET BY MOUTH  DAILY, Disp: 90 tablet, Rfl: 0 .  DEXILANT 60 MG capsule, TAKE 1 CAPSULE BY MOUTH DAILY, Disp: 30 capsule, Rfl: 11 .  fluticasone (FLONASE) 50 MCG/ACT nasal spray, Place 2 sprays into both nostrils daily., Disp: 16 g, Rfl: 2 .  fluticasone (FLONASE) 50 MCG/ACT nasal spray, SHAKE LIQUID AND USE 2 SPRAYS IN EACH NOSTRIL DAILY, Disp: 16 g, Rfl: 0 .  gabapentin (NEURONTIN) 300 MG capsule, TAKE 1 CAPSULE(300 MG) BY MOUTH AT BEDTIME, Disp: 90 capsule, Rfl: 0 .  ketoconazole (NIZORAL) 2 % cream, Apply 1 application topically daily., Disp: 60 g, Rfl: 2 .  LANTUS SOLOSTAR 100 UNIT/ML Solostar Pen, ADMINISTER 50 UNITS UNDER THE SKIN DAILY AT 10 PM, Disp: 15 mL, Rfl: 0 .  levothyroxine (SYNTHROID, LEVOTHROID) 50 MCG tablet, TAKE 1 TABLET(50 MCG) BY MOUTH DAILY BEFORE BREAKFAST, Disp: 90 tablet, Rfl: 0 .  losartan-hydrochlorothiazide (HYZAAR) 50-12.5 MG tablet, Take 1 tablet by mouth daily., Disp: 90 tablet, Rfl: 1 .  metFORMIN (GLUCOPHAGE) 500 MG tablet, Take 1 tablet (500 mg total) by mouth 2 (two) times daily with a meal., Disp: 60 tablet, Rfl: 2 .  omega-3 acid ethyl esters (LOVAZA) 1 g capsule, Take 1 capsule (1 g total) by mouth 2 (two) times daily., Disp: 60 capsule, Rfl: 11 .  ONE TOUCH ULTRA TEST test strip, TEST twice a day, Disp: 100 each, Rfl: 11 .  ONETOUCH DELICA LANCETS 29H MISC, 33 g by Does not apply route 2 (two) times daily. Test twice a day as directed, Disp: 100 each, Rfl: 2 .  Vitamin D, Ergocalciferol, (DRISDOL) 50000 units CAPS capsule, Take 1 capsule (50,000 Units total) by mouth once a week. For 12 weeks, Disp: 12 capsule, Rfl: 0  Current Facility-Administered Medications:  .  triamcinolone acetonide (KENALOG) 10 MG/ML injection 10 mg, 10 mg, Other, Once, Landis Martins, DPM  Allergies  Allergen Reactions  . Aspirin   . Duloxetine Hcl   . Penicillins   . Codeine Nausea And Vomiting and Rash  . Lyrica [Pregabalin] Nausea And Vomiting  . Sulfa Antibiotics Swelling     Review  of Systems  Constitutional: Negative for fatigue.  Eyes: Negative for blurred vision.  Cardiovascular: Negative for chest pain and orthopnea.  Neurological: Negative for headaches.  Endo/Heme/Allergies: Positive for polydipsia.      Objective  Vitals:   12/29/16 1415  BP: 112/74  Pulse: 91  Resp: 16  Temp: 97.9 F (36.6 C)  TempSrc: Oral  SpO2: 95%  Weight: 172 lb 9.6 oz (78.3 kg)  Height: _0  (1.626 m)    Physical Exam  Constitutional: She is oriented to person, place, and time and well-developed, well-nourished, and in no distress.  HENT:  Head: Normocephalic and atraumatic.  Cardiovascular: Normal rate, regular rhythm and normal  heart sounds.   No murmur heard. Pulmonary/Chest: Effort normal and breath sounds normal. She has no wheezes.  Abdominal: Soft. Bowel sounds are normal. There is no tenderness.  Neurological: She is alert and oriented to person, place, and time.  Psychiatric: Mood, memory, affect and judgment normal.  Nursing note and vitals reviewed.     Assessment & Plan  1. Uncontrolled type 2 diabetes mellitus with peripheral neuropathy (HCC)   A1c is 10.4%, this is worse from 6.8% from March 2018, patient is now taking Lantus and will be switched back to [facility], to be started at 16 units and increase every 3-4 days as appropriate. She will continue on metformin, recheck A1c in 3 months - POCT HgB A1C - metFORMIN (GLUCOPHAGE) 500 MG tablet; Take 1 tablet (500 mg total) by mouth 2 (two) times daily with a meal.  Dispense: 60 tablet; Refill: 2 - Insulin Degludec-Liraglutide (XULTOPHY) 100-3.6 UNIT-MG/ML SOPN; Inject 16 Units into the skin daily.  Dispense: 5 pen; Refill: 0  2. Essential hypertension BP stable on present antihypertensive therapy - losartan-hydrochlorothiazide (HYZAAR) 50-12.5 MG tablet; Take 1 tablet by mouth daily.  Dispense: 90 tablet; Refill: 1  3. Mixed hyperlipidemia  - Lipid panel - COMPLETE METABOLIC PANEL WITH  GFR  Sireen Halk Asad A. [facility] 12/29/2016 3:00 PM

## 2016-12-31 ENCOUNTER — Telehealth: Payer: Self-pay

## 2016-12-31 NOTE — Telephone Encounter (Signed)
We have not received any results for Cologuard for this patient.

## 2016-12-31 NOTE — Telephone Encounter (Signed)
Left a detail  voicemail.

## 2016-12-31 NOTE — Telephone Encounter (Signed)
Pt states she received a letter regarding her cologuard results. The results  sent to our office but  I don't see anything  scan in her chart. Do you remember seeing any results, Its been couple of months ?

## 2017-01-04 DIAGNOSIS — E782 Mixed hyperlipidemia: Secondary | ICD-10-CM | POA: Diagnosis not present

## 2017-01-05 LAB — COMPLETE METABOLIC PANEL WITH GFR
ALT: 78 U/L — ABNORMAL HIGH (ref 6–29)
AST: 143 U/L — ABNORMAL HIGH (ref 10–35)
Albumin: 4 g/dL (ref 3.6–5.1)
Alkaline Phosphatase: 70 U/L (ref 33–130)
BUN: 26 mg/dL — ABNORMAL HIGH (ref 7–25)
CO2: 21 mmol/L (ref 20–31)
Calcium: 9.6 mg/dL (ref 8.6–10.4)
Chloride: 100 mmol/L (ref 98–110)
Creat: 1.17 mg/dL — ABNORMAL HIGH (ref 0.60–0.93)
GFR, Est African American: 54 mL/min — ABNORMAL LOW (ref >=60)
GFR, Est Non African American: 47 mL/min — ABNORMAL LOW (ref >=60)
Glucose, Bld: 232 mg/dL — ABNORMAL HIGH (ref 65–99)
Potassium: 4.3 mmol/L (ref 3.5–5.3)
Sodium: 137 mmol/L (ref 135–146)
Total Bilirubin: 0.7 mg/dL (ref 0.2–1.2)
Total Protein: 7.8 g/dL (ref 6.1–8.1)

## 2017-01-05 LAB — LIPID PANEL
Cholesterol: 247 mg/dL — ABNORMAL HIGH (ref <200)
HDL: 37 mg/dL — ABNORMAL LOW (ref >50)
LDL Cholesterol: 149 mg/dL — ABNORMAL HIGH (ref <100)
Total CHOL/HDL Ratio: 6.7 Ratio — ABNORMAL HIGH (ref <5.0)
Triglycerides: 306 mg/dL — ABNORMAL HIGH (ref <150)
VLDL: 61 mg/dL — ABNORMAL HIGH (ref <30)

## 2017-01-08 DIAGNOSIS — M79672 Pain in left foot: Secondary | ICD-10-CM | POA: Diagnosis not present

## 2017-01-08 DIAGNOSIS — M1712 Unilateral primary osteoarthritis, left knee: Secondary | ICD-10-CM | POA: Diagnosis not present

## 2017-01-11 ENCOUNTER — Telehealth: Payer: Self-pay | Admitting: Family Medicine

## 2017-01-11 NOTE — Telephone Encounter (Signed)
Pt would like Bone Density and Lab Results.

## 2017-01-11 NOTE — Telephone Encounter (Signed)
Returned call and left a voice message for patient 

## 2017-01-13 ENCOUNTER — Telehealth: Payer: Self-pay | Admitting: Family Medicine

## 2017-01-13 NOTE — Telephone Encounter (Signed)
Pt wants to know if she can get another sample of the Xoltophy by Friday

## 2017-01-13 NOTE — Telephone Encounter (Signed)
Spoke to patient to come by.

## 2017-01-21 ENCOUNTER — Ambulatory Visit: Payer: PPO | Admitting: Family Medicine

## 2017-01-26 ENCOUNTER — Encounter: Payer: Self-pay | Admitting: Family Medicine

## 2017-01-26 ENCOUNTER — Ambulatory Visit (INDEPENDENT_AMBULATORY_CARE_PROVIDER_SITE_OTHER): Payer: PPO | Admitting: Family Medicine

## 2017-01-26 ENCOUNTER — Ambulatory Visit: Payer: PPO | Admitting: Family Medicine

## 2017-01-26 VITALS — BP 110/76 | HR 101 | Temp 98.2°F | Resp 17 | Ht 64.0 in | Wt 167.4 lb

## 2017-01-26 DIAGNOSIS — E782 Mixed hyperlipidemia: Secondary | ICD-10-CM

## 2017-01-26 DIAGNOSIS — R7401 Elevation of levels of liver transaminase levels: Secondary | ICD-10-CM

## 2017-01-26 DIAGNOSIS — E559 Vitamin D deficiency, unspecified: Secondary | ICD-10-CM

## 2017-01-26 DIAGNOSIS — R740 Nonspecific elevation of levels of transaminase and lactic acid dehydrogenase [LDH]: Secondary | ICD-10-CM

## 2017-01-26 MED ORDER — ICOSAPENT ETHYL 1 G PO CAPS: 2.0000 | ORAL_CAPSULE | Freq: Two times a day (BID) | ORAL | 0 refills | Status: DC

## 2017-01-26 NOTE — Progress Notes (Signed)
Name: Charlotte Herrera   MRN: [id number]    DOB: 18-Jun-1946   Date:01/26/2017       Progress Note  Subjective  Chief Complaint  Chief Complaint  Patient presents with  . Follow-up    Discuss lab results     HPI  Pt. Presents for work up of elevated liver enzymes. Her AST and ALT were elevated to 143 and 78 respectively in July 2018. She has previously been diagnosed with fatty liver disease and had a biopsy of the liver planned, patient could not follow up in time.  She reports no changes in appetite, no nausea, vomiting, changes in stool etc.   She is also due for repeat vitamin D testing, last level was below normal at 24 ng/mL, she took 12 weeks of vitamin D.    Past Medical History:  Diagnosis Date  . Depression   . Enlarged liver   . Fibromyalgia affecting multiple sites   . GERD (gastroesophageal reflux disease)   . Hyperlipidemia   . Hypertension   . Stroke ([place])   . Thyroid disease     Past Surgical History:  Procedure Laterality Date  . ABDOMINAL HYSTERECTOMY    . CARPAL TUNNEL RELEASE Left   . COLONOSCOPY  2015  . SPINE SURGERY    . VARICOSE VEIN SURGERY Left 1975    Family History  Problem Relation Age of Onset  . Cancer Mother   . Breast cancer Mother        52's  . Diabetes Father   . Hearing loss Father   . Cancer Father   . Diabetes Brother   . Cancer Brother   . Breast cancer Maternal Aunt        40's    Social History   Social History  . Marital status: Divorced    Spouse name: N/A  . Number of children: N/A  . Years of education: N/A   Occupational History  . Not on file.   Social History Main Topics  . Smoking status: Never Smoker  . Smokeless tobacco: Never Used  . Alcohol use No  . Drug use: No  . Sexual activity: No   Other Topics Concern  . Not on file   Social History Narrative  . No narrative on file     Current Outpatient Prescriptions:  .  allopurinol (ZYLOPRIM) 100 MG tablet, TAKE 1 TABLET(100 MG) BY MOUTH TWICE  DAILY, Disp: 180 tablet, Rfl: 0 .  ALPRAZolam (XANAX) 0.5 MG tablet, Take 1 tablet (0.5 mg total) by mouth 2 (two) times daily as needed. for anxiety, Disp: 60 tablet, Rfl: 0 .  B-D UF III MINI PEN NEEDLES 31G X 5 MM MISC, use as directed at bedtime, Disp: 100 each, Rfl: 2 .  Blood Glucose Monitoring Suppl (ONE TOUCH ULTRA MINI) W/DEVICE KIT, 1 Device by Does not apply route 2 (two) times daily., Disp: 1 each, Rfl: 0 .  cetirizine (ZYRTEC) 10 MG tablet, Take 10 mg by mouth daily., Disp: , Rfl:  .  cloNIDine (CATAPRES) 0.1 MG tablet, TAKE 1 TABLET(0.1 MG) BY MOUTH TWICE DAILY, Disp: 180 tablet, Rfl: 0 .  clopidogrel (PLAVIX) 75 MG tablet, TAKE 1 TABLET BY MOUTH DAILY, Disp: 90 tablet, Rfl: 0 .  DEXILANT 60 MG capsule, TAKE 1 CAPSULE BY MOUTH DAILY, Disp: 30 capsule, Rfl: 11 .  fluticasone (FLONASE) 50 MCG/ACT nasal spray, Place 2 sprays into both nostrils daily., Disp: 16 g, Rfl: 2 .  gabapentin (NEURONTIN) 300 MG capsule,  TAKE 1 CAPSULE(300 MG) BY MOUTH AT BEDTIME, Disp: 90 capsule, Rfl: 0 .  Insulin Degludec-Liraglutide (XULTOPHY) 100-3.6 UNIT-MG/ML SOPN, Inject 16 Units into the skin daily., Disp: 5 pen, Rfl: 0 .  ketoconazole (NIZORAL) 2 % cream, Apply 1 application topically daily., Disp: 60 g, Rfl: 2 .  levothyroxine (SYNTHROID, LEVOTHROID) 50 MCG tablet, TAKE 1 TABLET(50 MCG) BY MOUTH DAILY BEFORE BREAKFAST, Disp: 90 tablet, Rfl: 0 .  losartan-hydrochlorothiazide (HYZAAR) 50-12.5 MG tablet, Take 1 tablet by mouth daily., Disp: 90 tablet, Rfl: 1 .  metFORMIN (GLUCOPHAGE) 500 MG tablet, Take 1 tablet (500 mg total) by mouth 2 (two) times daily with a meal., Disp: 60 tablet, Rfl: 2 .  omega-3 acid ethyl esters (LOVAZA) 1 g capsule, Take 1 capsule (1 g total) by mouth 2 (two) times daily., Disp: 60 capsule, Rfl: 11 .  ONE TOUCH ULTRA TEST test strip, TEST twice a day, Disp: 100 each, Rfl: 11 .  ONETOUCH DELICA LANCETS 40H MISC, 33 g by Does not apply route 2 (two) times daily. Test twice a day as  directed, Disp: 100 each, Rfl: 2 .  Vitamin D, Ergocalciferol, (DRISDOL) 50000 units CAPS capsule, Take 1 capsule (50,000 Units total) by mouth once a week. For 12 weeks, Disp: 12 capsule, Rfl: 0  Current Facility-Administered Medications:  .  triamcinolone acetonide (KENALOG) 10 MG/ML injection 10 mg, 10 mg, Other, Once, Landis Martins, DPM  Allergies  Allergen Reactions  . Aspirin   . Duloxetine Hcl   . Penicillins   . Codeine Nausea And Vomiting and Rash  . Lyrica [Pregabalin] Nausea And Vomiting  . Sulfa Antibiotics Swelling     ROS  Please see history of present illness for complete discussion of ROS  Objective  Vitals:   01/26/17 1424  BP: 110/76  Pulse: (!) 101  Resp: 17  Temp: 98.2 F (36.8 C)  TempSrc: Oral  SpO2: 96%  Weight: 167 lb 6.4 oz (75.9 kg)  Height: 5' 4" (1.626 m)    Physical Exam  Constitutional: She is oriented to person, place, and time and well-developed, well-nourished, and in no distress.  HENT:  Head: Normocephalic and atraumatic.  Cardiovascular: Normal rate, regular rhythm and normal heart sounds.   No murmur heard. Pulmonary/Chest: Effort normal and breath sounds normal.  Abdominal: Soft. Bowel sounds are normal. There is tenderness in the right upper quadrant.  Neurological: She is alert and oriented to person, place, and time.  Nursing note and vitals reviewed.    Assessment & Plan  1. Transaminitis Obtain hepatic function panel, right upper quadrant ultrasound, follow-up - Hepatic function panel - US Abdomen Limited RUQ; Future  2. Vitamin D deficiency  - VITAMIN D 25 Hydroxy (Vit-D Deficiency, Fractures)  3. Mixed hyperlipidemia Patient has been taking Lovaza for hypertriglyceridemia, we'll switch to Vascepa. - Icosapent Ethyl (VASCEPA) 1 g CAPS; Take 2 capsules by mouth 2 (two) times daily with a meal.  Dispense: 360 capsule; Refill: 0   Syed Asad A. [facility] 01/26/2017 3:02 PM

## 2017-01-27 ENCOUNTER — Telehealth: Payer: Self-pay | Admitting: Family Medicine

## 2017-01-27 LAB — HEPATIC FUNCTION PANEL
ALT: 61 U/L — ABNORMAL HIGH (ref 6–29)
AST: 86 U/L — ABNORMAL HIGH (ref 10–35)
Albumin: 4 g/dL (ref 3.6–5.1)
Alkaline Phosphatase: 56 U/L (ref 33–130)
Bilirubin, Direct: 0.1 mg/dL (ref <=0.2)
Indirect Bilirubin: 0.3 mg/dL (ref 0.2–1.2)
Total Bilirubin: 0.4 mg/dL (ref 0.2–1.2)
Total Protein: 7.2 g/dL (ref 6.1–8.1)

## 2017-01-27 LAB — VITAMIN D 25 HYDROXY (VIT D DEFICIENCY, FRACTURES): Vit D, 25-Hydroxy: 32 ng/mL (ref 30–100)

## 2017-01-27 NOTE — Telephone Encounter (Signed)
Pt was seen yesterday and states her insurance will not cover icosapent ethyl. The pharmacy is going to fax something over pertaining to this medication to help them cover because its not on their formulary and if you will fill out the forms and fax it back then they will cover it.

## 2017-01-27 NOTE — Telephone Encounter (Signed)
Thanks, will review the pertinent information when received on my desk

## 2017-02-02 ENCOUNTER — Ambulatory Visit
Admission: RE | Admit: 2017-02-02 | Discharge: 2017-02-02 | Disposition: A | Discharge status: Home / Self Care | Payer: PPO | Source: Ambulatory Visit | Attending: Family Medicine | Admitting: Family Medicine

## 2017-02-02 DIAGNOSIS — R7401 Elevation of levels of liver transaminase levels: Secondary | ICD-10-CM

## 2017-02-02 DIAGNOSIS — K760 Fatty (change of) liver, not elsewhere classified: Secondary | ICD-10-CM | POA: Diagnosis not present

## 2017-02-02 DIAGNOSIS — R740 Nonspecific elevation of levels of transaminase and lactic acid dehydrogenase [LDH]: Secondary | ICD-10-CM | POA: Insufficient documentation

## 2017-02-05 ENCOUNTER — Other Ambulatory Visit: Payer: Self-pay | Admitting: Family Medicine

## 2017-02-05 NOTE — Telephone Encounter (Signed)
Dr. Manuella Ghazi will address upon his return on Tuesday.

## 2017-02-08 ENCOUNTER — Other Ambulatory Visit: Payer: Self-pay | Admitting: Family Medicine

## 2017-02-08 DIAGNOSIS — Z8673 Personal history of transient ischemic attack (TIA), and cerebral infarction without residual deficits: Secondary | ICD-10-CM

## 2017-02-08 NOTE — Telephone Encounter (Signed)
60 day supply provided to provide enough of medication through until next appointment on 03/31/2017.

## 2017-02-11 ENCOUNTER — Telehealth: Payer: Self-pay | Admitting: Family Medicine

## 2017-02-11 NOTE — Telephone Encounter (Signed)
Spoke with patient and she has been notified of vitamin D lab results and they were in normal range, pt verbalized understanding

## 2017-02-11 NOTE — Telephone Encounter (Signed)
PT SAID THAT SHE HAD A LAB TEST FOR HER VIT D AGAIN AFTER 01-26-17 AND WANTS TO KNOW THE RESULTS  SHE SAID THAT SOMEONE TOLD HER THAT HER LEVELS WERE LOW. SHE SAID THAT SHE COULD NOT REMEMBER IF IT WAS THE DRUG STORE OR THE PHYSICIAN OFFICE THAT TOLD HER THIS. SHE ALSO WANTS TO KNOW THE RESULTS LIVER SCAN THAT SHE HAS NOT HEARD FROM.

## 2017-02-18 ENCOUNTER — Other Ambulatory Visit: Payer: Self-pay | Admitting: Family Medicine

## 2017-02-18 DIAGNOSIS — F411 Generalized anxiety disorder: Secondary | ICD-10-CM

## 2017-02-19 ENCOUNTER — Telehealth: Payer: Self-pay | Admitting: Family Medicine

## 2017-02-19 NOTE — Telephone Encounter (Signed)
Pt would like a call back

## 2017-02-19 NOTE — Telephone Encounter (Signed)
Tried calling the pt to let her know that her Xanax Is ready for pick up. Thanks

## 2017-02-22 ENCOUNTER — Telehealth: Payer: Self-pay | Admitting: Family Medicine

## 2017-02-22 NOTE — Telephone Encounter (Signed)
Patient has been notified to pick up sample at office and has she has been directed to contact medication management for possible medication assistance

## 2017-02-22 NOTE — Telephone Encounter (Signed)
Pt states that the pharmacy told her that xultophy was going to cost about $60 (for 2 pens) (also this is due to her being in the gap). She would like to know if we have any samples [phone]

## 2017-02-24 DIAGNOSIS — M5431 Sciatica, right side: Secondary | ICD-10-CM | POA: Diagnosis not present

## 2017-02-24 DIAGNOSIS — M5136 Other intervertebral disc degeneration, lumbar region: Secondary | ICD-10-CM | POA: Diagnosis not present

## 2017-02-24 DIAGNOSIS — M9903 Segmental and somatic dysfunction of lumbar region: Secondary | ICD-10-CM | POA: Diagnosis not present

## 2017-02-24 DIAGNOSIS — M9905 Segmental and somatic dysfunction of pelvic region: Secondary | ICD-10-CM | POA: Diagnosis not present

## 2017-02-26 DIAGNOSIS — M1712 Unilateral primary osteoarthritis, left knee: Secondary | ICD-10-CM | POA: Diagnosis not present

## 2017-03-06 ENCOUNTER — Other Ambulatory Visit: Payer: Self-pay | Admitting: Family Medicine

## 2017-03-06 DIAGNOSIS — E038 Other specified hypothyroidism: Secondary | ICD-10-CM

## 2017-03-11 ENCOUNTER — Telehealth: Payer: Self-pay

## 2017-03-11 DIAGNOSIS — E038 Other specified hypothyroidism: Secondary | ICD-10-CM

## 2017-03-11 MED ORDER — LEVOTHYROXINE SODIUM 50 MCG PO TABS: ORAL_TABLET | ORAL | 0 refills | Status: DC

## 2017-03-11 NOTE — Telephone Encounter (Signed)
Medication has been sent to [facility]

## 2017-03-14 ENCOUNTER — Other Ambulatory Visit: Payer: Self-pay | Admitting: Family Medicine

## 2017-03-14 DIAGNOSIS — M1A9XX0 Chronic gout, unspecified, without tophus (tophi): Secondary | ICD-10-CM

## 2017-03-31 ENCOUNTER — Ambulatory Visit: Payer: Self-pay | Admitting: Family Medicine

## 2017-04-06 ENCOUNTER — Encounter: Payer: Self-pay | Admitting: Family Medicine

## 2017-04-06 ENCOUNTER — Ambulatory Visit (INDEPENDENT_AMBULATORY_CARE_PROVIDER_SITE_OTHER): Payer: PPO | Admitting: Family Medicine

## 2017-04-06 VITALS — BP 124/72 | HR 99 | Temp 97.7°F | Resp 16 | Ht 64.0 in | Wt 167.7 lb

## 2017-04-06 DIAGNOSIS — I10 Essential (primary) hypertension: Secondary | ICD-10-CM

## 2017-04-06 DIAGNOSIS — M1A9XX0 Chronic gout, unspecified, without tophus (tophi): Secondary | ICD-10-CM | POA: Diagnosis not present

## 2017-04-06 DIAGNOSIS — E1165 Type 2 diabetes mellitus with hyperglycemia: Secondary | ICD-10-CM | POA: Diagnosis not present

## 2017-04-06 DIAGNOSIS — Z23 Encounter for immunization: Secondary | ICD-10-CM | POA: Diagnosis not present

## 2017-04-06 DIAGNOSIS — E1142 Type 2 diabetes mellitus with diabetic polyneuropathy: Secondary | ICD-10-CM

## 2017-04-06 DIAGNOSIS — R748 Abnormal levels of other serum enzymes: Secondary | ICD-10-CM | POA: Diagnosis not present

## 2017-04-06 DIAGNOSIS — IMO0002 Reserved for concepts with insufficient information to code with codable children: Secondary | ICD-10-CM

## 2017-04-06 DIAGNOSIS — E782 Mixed hyperlipidemia: Secondary | ICD-10-CM

## 2017-04-06 DIAGNOSIS — E038 Other specified hypothyroidism: Secondary | ICD-10-CM

## 2017-04-06 DIAGNOSIS — Z8673 Personal history of transient ischemic attack (TIA), and cerebral infarction without residual deficits: Secondary | ICD-10-CM | POA: Diagnosis not present

## 2017-04-06 LAB — LIPID PANEL
Cholesterol: 213 mg/dL — ABNORMAL HIGH (ref <200)
HDL: 39 mg/dL — ABNORMAL LOW (ref >50)
LDL Cholesterol (Calc): 130 mg/dL (calc) — ABNORMAL HIGH
Non-HDL Cholesterol (Calc): 174 mg/dL (calc) — ABNORMAL HIGH (ref <130)
Total CHOL/HDL Ratio: 5.5 (calc) — ABNORMAL HIGH (ref <5.0)
Triglycerides: 288 mg/dL — ABNORMAL HIGH (ref <150)

## 2017-04-06 LAB — COMPLETE METABOLIC PANEL WITH GFR
AG Ratio: 1.1 (calc) (ref 1.0–2.5)
ALT: 76 U/L — ABNORMAL HIGH (ref 6–29)
AST: 147 U/L — ABNORMAL HIGH (ref 10–35)
Albumin: 4 g/dL (ref 3.6–5.1)
Alkaline phosphatase (APISO): 55 U/L (ref 33–130)
BUN/Creatinine Ratio: 18 (calc) (ref 6–22)
BUN: 17 mg/dL (ref 7–25)
CO2: 26 mmol/L (ref 20–32)
Calcium: 9.9 mg/dL (ref 8.6–10.4)
Chloride: 103 mmol/L (ref 98–110)
Creat: 0.97 mg/dL — ABNORMAL HIGH (ref 0.60–0.93)
GFR, Est African American: 68 mL/min/{1.73_m2} (ref >=60)
GFR, Est Non African American: 59 mL/min/{1.73_m2} — ABNORMAL LOW (ref >=60)
Globulin: 3.5 g/dL (calc) (ref 1.9–3.7)
Glucose, Bld: 220 mg/dL — ABNORMAL HIGH (ref 65–139)
Potassium: 4.2 mmol/L (ref 3.5–5.3)
Sodium: 137 mmol/L (ref 135–146)
Total Bilirubin: 0.6 mg/dL (ref 0.2–1.2)
Total Protein: 7.5 g/dL (ref 6.1–8.1)

## 2017-04-06 LAB — POCT GLYCOSYLATED HEMOGLOBIN (HGB A1C): Hemoglobin A1C: 9.2

## 2017-04-06 LAB — URIC ACID: Uric Acid, Serum: 5.1 mg/dL (ref 2.5–7.0)

## 2017-04-06 LAB — GLUCOSE, POCT (MANUAL RESULT ENTRY): POC Glucose: 190 mg/dl — AB (ref 70–99)

## 2017-04-06 LAB — TSH: TSH: 1.49 mIU/L (ref 0.40–4.50)

## 2017-04-06 MED ORDER — LEVOTHYROXINE SODIUM 50 MCG PO TABS: ORAL_TABLET | ORAL | 0 refills | Status: DC

## 2017-04-06 MED ORDER — INSULIN DEGLUDEC-LIRAGLUTIDE 100-3.6 UNIT-MG/ML ~~LOC~~ SOPN: 50.0000 [IU] | PEN_INJECTOR | Freq: Every day | SUBCUTANEOUS | 2 refills | Status: DC

## 2017-04-06 MED ORDER — ALLOPURINOL 100 MG PO TABS: ORAL_TABLET | ORAL | 0 refills | Status: DC

## 2017-04-06 MED ORDER — CLOPIDOGREL BISULFATE 75 MG PO TABS: 75.0000 mg | ORAL_TABLET | Freq: Every day | ORAL | 1 refills | Status: DC

## 2017-04-06 MED ORDER — CLONIDINE HCL 0.1 MG PO TABS: ORAL_TABLET | ORAL | 0 refills | Status: DC

## 2017-04-06 MED ORDER — METFORMIN HCL 500 MG PO TABS: 500.0000 mg | ORAL_TABLET | Freq: Two times a day (BID) | ORAL | 2 refills | Status: DC

## 2017-04-06 MED ORDER — LOSARTAN POTASSIUM-HCTZ 50-12.5 MG PO TABS: 1.0000 | ORAL_TABLET | Freq: Every day | ORAL | 1 refills | Status: DC

## 2017-04-06 NOTE — Progress Notes (Signed)
Name: Charlotte Herrera   MRN: [id number]    DOB: May 01, 1946   Date:04/06/2017       Progress Note  Subjective  Chief Complaint  Chief Complaint  Patient presents with  . Diabetes  . Hypertension  . Follow-up    3 month recheck    Diabetes  She presents for her follow-up diabetic visit. She has type 2 diabetes mellitus. Her disease course has been improving. There are no hypoglycemic associated symptoms. Pertinent negatives for hypoglycemia include no headaches. Associated symptoms include chest pain, fatigue and foot paresthesias. Pertinent negatives for diabetes include no blurred vision, no polydipsia and no polyuria. Diabetic complications include a CVA and peripheral neuropathy. Pertinent negatives for diabetic complications include no heart disease. Current diabetic treatment includes intensive insulin program and oral agent (monotherapy). She is following a generally healthy diet. She has had a previous visit with a dietitian. She participates in exercise daily. She monitors blood glucose at home 1-2 x per day. Her breakfast blood glucose range is generally 180-200 mg/dl. An ACE inhibitor/angiotensin II receptor blocker is being taken. Eye exam is current.  Hyperlipidemia  This is a chronic problem. The problem is uncontrolled. Recent lipid tests were reviewed and are high. Associated symptoms include chest pain. Treatments tried: takes Vascepa 2g BID, decreased to 1 mg BID for GI side effects. Risk factors for coronary artery disease include dyslipidemia, diabetes mellitus and obesity.  Hypertension  This is a chronic problem. The problem is unchanged. The problem is controlled. Associated symptoms include chest pain. Pertinent negatives include no blurred vision, headaches or orthopnea. Past treatments include angiotensin blockers, diuretics and central alpha agonists. Hypertensive end-organ damage includes CVA. Identifiable causes of hypertension include a thyroid problem.  Thyroid Problem   Presents for follow-up visit. Symptoms include cold intolerance, constipation, dry skin, fatigue and hair loss. The symptoms have been stable. Her past medical history is significant for hyperlipidemia.    Past Medical History:  Diagnosis Date  . Depression   . Enlarged liver   . Fibromyalgia affecting multiple sites   . GERD (gastroesophageal reflux disease)   . Hyperlipidemia   . Hypertension   . Stroke ([place])   . Thyroid disease     Past Surgical History:  Procedure Laterality Date  . ABDOMINAL HYSTERECTOMY    . CARPAL TUNNEL RELEASE Left   . COLONOSCOPY  2015  . SPINE SURGERY    . VARICOSE VEIN SURGERY Left 1975    Family History  Problem Relation Age of Onset  . Cancer Mother   . Breast cancer Mother        46's  . Diabetes Father   . Hearing loss Father   . Cancer Father   . Diabetes Brother   . Cancer Brother   . Breast cancer Maternal Aunt        40's    Social History   Social History  . Marital status: Divorced    Spouse name: N/A  . Number of children: N/A  . Years of education: N/A   Occupational History  . Not on file.   Social History Main Topics  . Smoking status: Never Smoker  . Smokeless tobacco: Never Used  . Alcohol use No  . Drug use: No  . Sexual activity: No   Other Topics Concern  . Not on file   Social History Narrative  . No narrative on file     Current Outpatient Prescriptions:  .  allopurinol (ZYLOPRIM) 100 MG tablet, TAKE  1 TABLET(100 MG) BY MOUTH TWICE DAILY, Disp: 180 tablet, Rfl: 0 .  ALPRAZolam (XANAX) 0.5 MG tablet, TAKE 1 TABLET BY MOUTH TWICE DAILY AS NEEDED, Disp: 60 tablet, Rfl: 2 .  B-D UF III MINI PEN NEEDLES 31G X 5 MM MISC, use as directed at bedtime, Disp: 100 each, Rfl: 2 .  Blood Glucose Monitoring Suppl (ONE TOUCH ULTRA MINI) W/DEVICE KIT, 1 Device by Does not apply route 2 (two) times daily., Disp: 1 each, Rfl: 0 .  cetirizine (ZYRTEC) 10 MG tablet, Take 10 mg by mouth daily., Disp: , Rfl:  .   cloNIDine (CATAPRES) 0.1 MG tablet, TAKE 1 TABLET(0.1 MG) BY MOUTH TWICE DAILY, Disp: 180 tablet, Rfl: 0 .  clopidogrel (PLAVIX) 75 MG tablet, TAKE 1 TABLET BY MOUTH DAILY, Disp: 90 tablet, Rfl: 1 .  DEXILANT 60 MG capsule, TAKE 1 CAPSULE BY MOUTH DAILY, Disp: 30 capsule, Rfl: 11 .  fluticasone (FLONASE) 50 MCG/ACT nasal spray, Place 2 sprays into both nostrils daily., Disp: 16 g, Rfl: 2 .  gabapentin (NEURONTIN) 300 MG capsule, TAKE 1 CAPSULE(300 MG) BY MOUTH AT BEDTIME, Disp: 90 capsule, Rfl: 0 .  Icosapent Ethyl (VASCEPA) 1 g CAPS, Take 2 capsules by mouth 2 (two) times daily with a meal., Disp: 360 capsule, Rfl: 0 .  ketoconazole (NIZORAL) 2 % cream, Apply 1 application topically daily., Disp: 60 g, Rfl: 2 .  levothyroxine (SYNTHROID, LEVOTHROID) 50 MCG tablet, TAKE 1 TABLET(50 MCG) BY MOUTH DAILY BEFORE BREAKFAST, Disp: 90 tablet, Rfl: 0 .  losartan-hydrochlorothiazide (HYZAAR) 50-12.5 MG tablet, Take 1 tablet by mouth daily., Disp: 90 tablet, Rfl: 1 .  ONE TOUCH ULTRA TEST test strip, TEST twice a day, Disp: 100 each, Rfl: 11 .  ONETOUCH DELICA LANCETS 99H MISC, 33 g by Does not apply route 2 (two) times daily. Test twice a day as directed, Disp: 100 each, Rfl: 2 .  Vitamin D, Ergocalciferol, (DRISDOL) 50000 units CAPS capsule, Take 1 capsule (50,000 Units total) by mouth once a week. For 12 weeks, Disp: 12 capsule, Rfl: 0 .  metFORMIN (GLUCOPHAGE) 500 MG tablet, Take 1 tablet (500 mg total) by mouth 2 (two) times daily with a meal., Disp: 60 tablet, Rfl: 2  Current Facility-Administered Medications:  .  triamcinolone acetonide (KENALOG) 10 MG/ML injection 10 mg, 10 mg, Other, Once, Landis Martins, DPM  Allergies  Allergen Reactions  . Aspirin   . Duloxetine Hcl   . Penicillins   . Codeine Nausea And Vomiting and Rash  . Lyrica [Pregabalin] Nausea And Vomiting  . Sulfa Antibiotics Swelling     Review of Systems  Constitutional: Positive for fatigue.  Eyes: Negative for blurred  vision.  Cardiovascular: Positive for chest pain. Negative for orthopnea.  Gastrointestinal: Positive for constipation.  Neurological: Negative for headaches.  Endo/Heme/Allergies: Positive for cold intolerance. Negative for polydipsia.     Objective  Vitals:   04/06/17 0948  BP: 124/72  Pulse: 99  Resp: 16  Temp: 97.7 F (36.5 C)  TempSrc: Oral  SpO2: 97%  Weight: 167 lb 11.2 oz (76.1 kg)  Height: 5' 4"  (1.626 m)    Physical Exam  Constitutional: She is oriented to person, place, and time and well-developed, well-nourished, and in no distress.  HENT:  Head: Normocephalic and atraumatic.  Cardiovascular: Normal rate, regular rhythm and normal heart sounds.   No murmur heard. Pulmonary/Chest: Effort normal and breath sounds normal. She has no wheezes.  Abdominal: Soft. Bowel sounds are normal. There is tenderness in  the right upper quadrant.  Neurological: She is alert and oriented to person, place, and time.  Psychiatric: Mood, memory, affect and judgment normal.  Nursing note and vitals reviewed.       Assessment & Plan  1. Uncontrolled type 2 diabetes mellitus with peripheral neuropathy (HCC)   A1c is 9.2%, still uncontrolled but improved from 10.4% from 3 months ago, we will increase to 5-50 units at bedtime, continue on metformin 500 mg twice a day, advised on dietary changes for now, follow-up in 3 months - POCT HgB A1C - POCT Glucose (CBG) - metFORMIN (GLUCOPHAGE) 500 MG tablet; Take 1 tablet (500 mg total) by mouth 2 (two) times daily with a meal.  Dispense: 60 tablet; Refill: 2  2. Chronic gout involving toe of right foot without tophus, unspecified cause Obtain uric acid level, continue on allopurinol - allopurinol (ZYLOPRIM) 100 MG tablet; TAKE 1 TABLET(100 MG) BY MOUTH TWICE DAILY  Dispense: 180 tablet; Refill: 0 - Uric acid  3. Essential hypertension BP stable on present anti-hypertensive treatment - cloNIDine (CATAPRES) 0.1 MG tablet; TAKE 1  TABLET(0.1 MG) BY MOUTH TWICE DAILY  Dispense: 180 tablet; Refill: 0  4. Hx of transient ischemic attack (TIA)  - clopidogrel (PLAVIX) 75 MG tablet; Take 1 tablet (75 mg total) by mouth daily.  Dispense: 90 tablet; Refill: 1  5. Other specified hypothyroidism Obtain TSH and adjust levothyroxine if appropriate - levothyroxine (SYNTHROID, LEVOTHROID) 50 MCG tablet; TAKE 1 TABLET(50 MCG) BY MOUTH DAILY BEFORE BREAKFAST  Dispense: 90 tablet; Refill: 0 - TSH  6. Elevated liver enzymes She has history of fatty liver disease, obtain liver enzymes - COMPLETE METABOLIC PANEL WITH GFR  7. Mixed hyperlipidemia  - Lipid panel  8. Need for influenza vaccination  - Flu vaccine HIGH DOSE PF (Fluzone High dose)  Syed Asad A. [facility] 04/06/2017 9:58 AM

## 2017-04-08 ENCOUNTER — Telehealth: Payer: Self-pay

## 2017-04-08 DIAGNOSIS — R7989 Other specified abnormal findings of blood chemistry: Secondary | ICD-10-CM

## 2017-04-08 DIAGNOSIS — R945 Abnormal results of liver function studies: Principal | ICD-10-CM

## 2017-04-08 NOTE — Telephone Encounter (Signed)
Called pt informed her of the information below. Pt gave verbal understanding, however states that she has "too many appts right now and cannot schedule at this time" will call back after her upcoming appt with cardiologist to f/u with cholesterol. Referral placed for hepatology.

## 2017-04-08 NOTE — Telephone Encounter (Signed)
-----   Message from Roselee Nova, MD sent at 04/07/2017  6:25 PM EDT ----- FLP: Elevated total cholesterol, triglycerides and LDL cholesterol, HDL is below normal at 39 mg/dL, she is not a candidate for statin because of elevated liver enzymes. She can be started on Repatha which is a monoclonal antibody, please schedule an appointment to discuss treatment options CMP shows elevated glucose at 220 mg/dL, normal creatinine, electrolytes, elevated AST and ALT. Both AST and ALTs are worse from 2 months ago she should be referred to hepatology for further management Serum uric acid level is at goal at 5.1 mg/dL.

## 2017-04-12 ENCOUNTER — Telehealth: Payer: Self-pay | Admitting: Family Medicine

## 2017-04-12 ENCOUNTER — Other Ambulatory Visit: Payer: Self-pay | Admitting: Gastroenterology

## 2017-04-12 NOTE — Telephone Encounter (Signed)
Pt needs refill on Xoltyphy to be sent to [facility] in [place].

## 2017-04-13 NOTE — Telephone Encounter (Signed)
Pt asking for a refill for xultophy I saw that it was printed but pt states she never received the printed out RX. Called the pharmacy and did a verbal exactly how Dr. Manuella Ghazi have it.  They will call the pt once it's ready.

## 2017-04-22 ENCOUNTER — Telehealth: Payer: Self-pay

## 2017-04-22 DIAGNOSIS — Z8249 Family history of ischemic heart disease and other diseases of the circulatory system: Secondary | ICD-10-CM

## 2017-04-22 NOTE — Telephone Encounter (Signed)
Please contact patient to determine indication for cardiology referral

## 2017-04-22 NOTE — Telephone Encounter (Signed)
There is no cardiology referral in for this patient.  Please advise.   Copied from [place] #[id number]. Topic: Referral - Request >> Apr 19, 2017  3:03 PM Conception Chancy, NT wrote: Reason for CRM: pt needs to see a heart doctor. Would like to see the doctor I have provided. Can contact pt if need further information.  Fax number  [facility]  [place]

## 2017-04-23 NOTE — Telephone Encounter (Signed)
Spoke with patient, she has family history of heart disease, one of her daughters passed away reportedly of a cardiac condition, the other daughter is seeing a cardiologist in [place]. She has independent risk factors for heart disease including diabetes and dyslipidemia and would like to be seen by Dr. Bryson Ha at [facility]. We'll place the referral.

## 2017-04-23 NOTE — Telephone Encounter (Signed)
I contacted this patient to find out why she is needing the cardiology referral and she said it was due to the conversation discussed during her last office visit. I looked at the last note and the lab results but I did not see anything pertaining to a cardiology referral just hepatology.  Patient was informed but then she said that it was because of her cholesterol and diabetes.  Please advise.

## 2017-04-23 NOTE — Telephone Encounter (Signed)
Patient called back and asked if Dr. Manuella Ghazi has responded and I informed her that he has not but that I will send him another message.  She asked if he could give her a call back at work at [phone].

## 2017-04-23 NOTE — Telephone Encounter (Signed)
I have already spoken with this patient this morning and I am waiting to hear back from her provider on how to proceed with her request.  Copied from [place]. Topic: General - Other >> Apr 23, 2017 10:13 AM Charlotte Herrera, NT wrote: Reason for CRM:was  Waiting for a return call from [place] wants to be  called on home phone will be there 1hr will call from work  If miss call again

## 2017-04-26 ENCOUNTER — Telehealth: Payer: Self-pay

## 2017-04-26 NOTE — Telephone Encounter (Addendum)
Spoke to the pt and she states it was for a referral for cardiology to Dr. Merrily Pew deen in [place]. I saw the referral was put in on 04/23/2017 and everything was sent over to there office on the same day. I metion to pt it can take up to a week in order to received a call for an apt. She states she has there information therefore I suggest if she want to call them and see exactly were she's at with getting an apt. Pt started crying saying she lost her daughter to heart disease and they did not know she had it. I suggest some therapy and she states hospice states they can help but it wouldn't be until another couple of months before they can do anything. She also states she is having some money problems and can not afford her medicine. Dr. Manuella Ghazi is it anything we can do on our end ?

## 2017-04-26 NOTE — Telephone Encounter (Signed)
Last lab results was on 04/06/2017 and it was document the pt was informed by Dr. Manuella Ghazi nurse. I'm not for sure what results she need. But I left a message asking her what results she is asking for and to call me back.

## 2017-04-26 NOTE — Telephone Encounter (Signed)
Patient just called and said she missed a call from the nurse. Please try again and give her a call back. PLEASE CALL HER ON [phone]

## 2017-04-26 NOTE — Telephone Encounter (Signed)
Copied from [place] #[id number]. Topic: General - Other >> Apr 23, 2017  2:42 PM Pricilla Handler wrote: Reason for CRM: Please call this patient regarding results/request. Thank You!!!

## 2017-04-27 NOTE — Telephone Encounter (Signed)
Patient has a follow-up scheduled with the cardiologist for family history of heart disease, reviewed her medications and lab work which was normal and reassured. 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 No other concerns at this time.

## 2017-04-29 ENCOUNTER — Other Ambulatory Visit: Payer: Self-pay | Admitting: Family Medicine

## 2017-05-04 ENCOUNTER — Ambulatory Visit: Payer: PPO | Admitting: Gastroenterology

## 2017-05-06 ENCOUNTER — Telehealth: Payer: Self-pay | Admitting: Gastroenterology

## 2017-05-06 NOTE — Telephone Encounter (Signed)
Patient called to reschedule her appt due to her work schedule. I offered her several different appointments but she refused due to work. She stated she would call Dr. Manuella Ghazi to see if she if she should schedule with Dr. Kennieth Francois after her appt with him.

## 2017-05-14 ENCOUNTER — Telehealth: Payer: Self-pay | Admitting: Gastroenterology

## 2017-05-14 ENCOUNTER — Other Ambulatory Visit: Payer: Self-pay | Admitting: Gastroenterology

## 2017-05-14 NOTE — Telephone Encounter (Signed)
*  STAT* If patient is at the pharmacy, call can be transferred to refill team.   1. Which medications need to be refilled? (please list name of each medication and dose if known) Dexilant   2. Which pharmacy/location (including street and city if local pharmacy) is medication to be sent to? [facility]  3. Do they need a 30 day or 90 day supply? 30 day

## 2017-05-17 ENCOUNTER — Other Ambulatory Visit: Payer: Self-pay | Admitting: Family Medicine

## 2017-05-17 DIAGNOSIS — F411 Generalized anxiety disorder: Secondary | ICD-10-CM

## 2017-05-18 ENCOUNTER — Ambulatory Visit: Payer: PPO | Admitting: Gastroenterology

## 2017-05-25 ENCOUNTER — Ambulatory Visit: Payer: PPO

## 2017-05-28 DIAGNOSIS — Z8249 Family history of ischemic heart disease and other diseases of the circulatory system: Secondary | ICD-10-CM | POA: Diagnosis not present

## 2017-05-28 DIAGNOSIS — Z794 Long term (current) use of insulin: Secondary | ICD-10-CM | POA: Diagnosis not present

## 2017-05-28 DIAGNOSIS — Z136 Encounter for screening for cardiovascular disorders: Secondary | ICD-10-CM | POA: Diagnosis not present

## 2017-05-28 DIAGNOSIS — Z9889 Other specified postprocedural states: Secondary | ICD-10-CM | POA: Diagnosis not present

## 2017-05-28 DIAGNOSIS — E119 Type 2 diabetes mellitus without complications: Secondary | ICD-10-CM | POA: Diagnosis not present

## 2017-05-28 DIAGNOSIS — R0609 Other forms of dyspnea: Secondary | ICD-10-CM | POA: Diagnosis not present

## 2017-05-28 DIAGNOSIS — G459 Transient cerebral ischemic attack, unspecified: Secondary | ICD-10-CM | POA: Diagnosis not present

## 2017-05-28 DIAGNOSIS — E785 Hyperlipidemia, unspecified: Secondary | ICD-10-CM | POA: Diagnosis not present

## 2017-06-08 ENCOUNTER — Other Ambulatory Visit: Payer: Self-pay | Admitting: Family Medicine

## 2017-06-08 NOTE — Telephone Encounter (Signed)
Copied from [place] [id number]. Topic: Quick Communication - Rx Refill/Question >> Jun 08, 2017  2:05 PM Oliver Pila B wrote: Reason for CRM: pt called b/c pt is needing a refill for Blood Glucose Monitoring Suppl test strips, contact pt if needed, and send to pharmacy in [place]

## 2017-06-09 MED ORDER — GLUCOSE BLOOD VI STRP: ORAL_STRIP | 3 refills | Status: DC

## 2017-06-14 ENCOUNTER — Other Ambulatory Visit: Payer: Self-pay

## 2017-06-14 DIAGNOSIS — F411 Generalized anxiety disorder: Secondary | ICD-10-CM

## 2017-06-15 NOTE — Telephone Encounter (Signed)
Pt is calling about medication refill  cb # [phone] Please call with update. Pt only has 1 more pill left

## 2017-06-16 ENCOUNTER — Other Ambulatory Visit: Payer: Self-pay

## 2017-06-16 ENCOUNTER — Telehealth: Payer: Self-pay | Admitting: Family Medicine

## 2017-06-16 DIAGNOSIS — F411 Generalized anxiety disorder: Secondary | ICD-10-CM

## 2017-06-16 NOTE — Telephone Encounter (Signed)
Pt has been informed that Dr. Manuella Ghazi is out this week  but will check his inbox tomorrow therefore he will review the request.  She also is aware that this month is the last month that he will Rx controlled substance medicine and she will be assign to a new Doctor in our office by her her last name.

## 2017-06-16 NOTE — Telephone Encounter (Signed)
Charlotte Herrera spoke to a rep at our [facility] call center and she mention to the rep that Dr. Manuella Ghazi is out of the office for the rest of the week and this particular medication would need a signature therefore he will not be back in to the office until Monday to review

## 2017-06-16 NOTE — Telephone Encounter (Signed)
Copied from [place]. Topic: Inquiry >> Jun 16, 2017 10:48 AM Neva Seat wrote: Pt got disconnected talking with someone to see if her hard copy of  Alprazolam is ready for her to pick up at the office.  Please call pt back to let her know.

## 2017-06-16 NOTE — Telephone Encounter (Signed)
Copied from [place]. Topic: Quick Communication - See Telephone Encounter >> Jun 16, 2017 11:45 AM Corie Chiquito, NT wrote: CRM for notification. See Telephone encounter for: Patient calling because she needs the hard copy of her Xanax prescription so she could get it refilled. If someone could please give her a call whenever it is ready for her to come by and pick it up at [phone]  06/16/17.

## 2017-06-17 ENCOUNTER — Other Ambulatory Visit: Payer: Self-pay

## 2017-06-17 DIAGNOSIS — F411 Generalized anxiety disorder: Secondary | ICD-10-CM

## 2017-06-17 MED ORDER — ALPRAZOLAM 0.5 MG PO TABS: 0.5000 mg | ORAL_TABLET | Freq: Two times a day (BID) | ORAL | 0 refills | Status: DC | PRN

## 2017-06-17 NOTE — Progress Notes (Signed)
Spoke to Dr. Manuella Ghazi reviewed her chart and approve verbally her Rx. Pt does understand this is her last Rx from Dr. Manuella Ghazi due to his departure from the practice. She have the option to stay with our practice and be assign to a provider  by her last name once Dr. Manuella Ghazi leaves.  Pt understood.

## 2017-06-17 NOTE — Telephone Encounter (Signed)
Pt called to check  If the rx for xanax is ready for pick-up

## 2017-06-17 NOTE — Telephone Encounter (Signed)
Rx was approved and signed and Charlotte Herrera called and left a message to have her pick up the Rx

## 2017-06-17 NOTE — Telephone Encounter (Signed)
Rx for Alprazolam is printed and is ready for pick up.

## 2017-07-06 ENCOUNTER — Ambulatory Visit (INDEPENDENT_AMBULATORY_CARE_PROVIDER_SITE_OTHER): Payer: PPO

## 2017-07-06 ENCOUNTER — Encounter: Payer: Self-pay | Admitting: Family Medicine

## 2017-07-06 ENCOUNTER — Ambulatory Visit (INDEPENDENT_AMBULATORY_CARE_PROVIDER_SITE_OTHER): Payer: PPO | Admitting: Family Medicine

## 2017-07-06 VITALS — BP 102/60 | HR 82 | Temp 98.4°F | Resp 12 | Ht 64.0 in | Wt 163.6 lb

## 2017-07-06 DIAGNOSIS — I10 Essential (primary) hypertension: Secondary | ICD-10-CM

## 2017-07-06 DIAGNOSIS — E1142 Type 2 diabetes mellitus with diabetic polyneuropathy: Secondary | ICD-10-CM | POA: Diagnosis not present

## 2017-07-06 DIAGNOSIS — F411 Generalized anxiety disorder: Secondary | ICD-10-CM

## 2017-07-06 DIAGNOSIS — Z8673 Personal history of transient ischemic attack (TIA), and cerebral infarction without residual deficits: Secondary | ICD-10-CM | POA: Diagnosis not present

## 2017-07-06 DIAGNOSIS — E782 Mixed hyperlipidemia: Secondary | ICD-10-CM | POA: Diagnosis not present

## 2017-07-06 DIAGNOSIS — Z0000 Encounter for general adult medical examination without abnormal findings: Secondary | ICD-10-CM | POA: Diagnosis not present

## 2017-07-06 DIAGNOSIS — E1165 Type 2 diabetes mellitus with hyperglycemia: Secondary | ICD-10-CM

## 2017-07-06 DIAGNOSIS — Z23 Encounter for immunization: Secondary | ICD-10-CM | POA: Diagnosis not present

## 2017-07-06 DIAGNOSIS — IMO0002 Reserved for concepts with insufficient information to code with codable children: Secondary | ICD-10-CM

## 2017-07-06 MED ORDER — CLOPIDOGREL BISULFATE 75 MG PO TABS: 75.0000 mg | ORAL_TABLET | Freq: Every day | ORAL | 3 refills | Status: DC

## 2017-07-06 MED ORDER — ALPRAZOLAM 0.5 MG PO TABS: 0.5000 mg | ORAL_TABLET | Freq: Two times a day (BID) | ORAL | 0 refills | Status: DC | PRN

## 2017-07-06 MED ORDER — INSULIN DEGLUDEC-LIRAGLUTIDE 100-3.6 UNIT-MG/ML ~~LOC~~ SOPN: 50.0000 [IU] | PEN_INJECTOR | Freq: Every day | SUBCUTANEOUS | 2 refills | Status: DC

## 2017-07-06 MED ORDER — METFORMIN HCL 500 MG PO TABS: 500.0000 mg | ORAL_TABLET | Freq: Two times a day (BID) | ORAL | 2 refills | Status: DC

## 2017-07-06 NOTE — Progress Notes (Signed)
Name: Charlotte Herrera   MRN: [id number]    DOB: Oct 22, 1945   Date:07/06/2017       Progress Note  Subjective  Chief Complaint  Chief Complaint  Patient presents with  . Hyperlipidemia  . Hypertension  . Diabetes    Hyperlipidemia  This is a chronic problem. The problem is uncontrolled. Recent lipid tests were reviewed and are high. Pertinent negatives include no chest pain (has chest pain every night and informed her Cardiologist who ordered a stress test.) or shortness of breath. Current antihyperlipidemic treatment includes statins (started on Pravastatin 10 mg by Cardiology.). Risk factors for coronary artery disease include dyslipidemia.  Hypertension  This is a chronic problem. The problem is unchanged. The problem is controlled. Pertinent negatives include no blurred vision, chest pain (has chest pain every night and informed her Cardiologist who ordered a stress test.), headaches, palpitations, shortness of breath or sweats. Past treatments include angiotensin blockers and diuretics.  Diabetes  She presents for her follow-up diabetic visit. She has type 2 diabetes mellitus. Her disease course has been stable. There are no hypoglycemic associated symptoms. Pertinent negatives for hypoglycemia include no dizziness, headaches, nervousness/anxiousness or sweats. Associated symptoms include fatigue, polydipsia and polyuria. Pertinent negatives for diabetes include no blurred vision, no chest pain (has chest pain every night and informed her Cardiologist who ordered a stress test.) and no foot paresthesias. There are no hypoglycemic complications. Current diabetic treatment includes intensive insulin program and oral agent (dual therapy). She is currently taking insulin at bedtime. Insulin injections are given by patient. She is following a diabetic and generally healthy diet. She monitors blood glucose at home 1-2 x per day. Her breakfast blood glucose range is generally 140-180 mg/dl.     Past  Medical History:  Diagnosis Date  . Depression   . Enlarged liver   . Fibromyalgia affecting multiple sites   . GERD (gastroesophageal reflux disease)   . Hyperlipidemia   . Hypertension   . Stroke ([place])   . Thyroid disease     Past Surgical History:  Procedure Laterality Date  . ABDOMINAL HYSTERECTOMY    . CARPAL TUNNEL RELEASE Left   . COLONOSCOPY  2015  . SPINE SURGERY    . VARICOSE VEIN SURGERY Left 1975    Family History  Problem Relation Age of Onset  . Cancer Mother   . Ovarian cancer Mother   . Diabetes Father   . Cancer Father   . Prostate cancer Father   . Heart disease Father   . Diabetes Brother   . Cancer Brother   . Breast cancer Maternal Aunt        40's  . Heart disease Daughter   . Healthy Daughter   . Healthy Daughter   . Ovarian cancer Maternal Aunt     Social History   Socioeconomic History  . Marital status: Divorced    Spouse name: Not on file  . Number of children: 3  . Years of education: some college  . Highest education level: GED or equivalent  Social Needs  . Financial resource strain: Not hard at all  . Food insecurity - worry: Never true  . Food insecurity - inability: Never true  . Transportation needs - medical: No  . Transportation needs - non-medical: No  Occupational History  . Occupation: Surveyor, mining:  [employer]  Tobacco Use  . Smoking status: Never Smoker  . Smokeless tobacco: Never Used  . Tobacco comment:  smoking cessation materials not required  Substance and Sexual Activity  . Alcohol use: No  . Drug use: No  . Sexual activity: No  Other Topics Concern  . Not on file  Social History Narrative  . Not on file     Current Outpatient Medications:  .  allopurinol (ZYLOPRIM) 100 MG tablet, TAKE 1 TABLET(100 MG) BY MOUTH TWICE DAILY, Disp: 180 tablet, Rfl: 0 .  ALPRAZolam (XANAX) 0.5 MG tablet, Take 1 tablet (0.5 mg total) by mouth 2 (two) times daily as needed., Disp: 60 tablet, Rfl: 0 .   B-D UF III MINI PEN NEEDLES 31G X 5 MM MISC, USE AS DIRECTED AT BEDTIME, Disp: 100 each, Rfl: 0 .  Blood Glucose Monitoring Suppl (ONE TOUCH ULTRA MINI) W/DEVICE KIT, 1 Device by Does not apply route 2 (two) times daily., Disp: 1 each, Rfl: 0 .  cetirizine (ZYRTEC) 10 MG tablet, Take 10 mg by mouth daily., Disp: , Rfl:  .  clopidogrel (PLAVIX) 75 MG tablet, Take 75 mg by mouth daily., Disp: , Rfl:  .  DEXILANT 60 MG capsule, TAKE 1 CAPSULE BY MOUTH DAILY, Disp: 30 capsule, Rfl: 6 .  fluticasone (FLONASE) 50 MCG/ACT nasal spray, Place 2 sprays into both nostrils daily., Disp: 16 g, Rfl: 2 .  gabapentin (NEURONTIN) 300 MG capsule, TAKE 1 CAPSULE(300 MG) BY MOUTH AT BEDTIME, Disp: 90 capsule, Rfl: 0 .  glucose blood (ONE TOUCH ULTRA TEST) test strip, TEST twice a day, Disp: 180 each, Rfl: 3 .  Insulin Degludec-Liraglutide (XULTOPHY) 100-3.6 UNIT-MG/ML SOPN, Inject 50 Units into the skin at bedtime., Disp: 3 pen, Rfl: 2 .  ketoconazole (NIZORAL) 2 % cream, Apply 1 application topically daily., Disp: 60 g, Rfl: 2 .  levothyroxine (SYNTHROID, LEVOTHROID) 50 MCG tablet, TAKE 1 TABLET(50 MCG) BY MOUTH DAILY BEFORE BREAKFAST, Disp: 90 tablet, Rfl: 0 .  losartan-hydrochlorothiazide (HYZAAR) 100-25 MG tablet, Take 1 tablet by mouth daily., Disp: , Rfl:  .  ONETOUCH DELICA LANCETS 01B MISC, 33 g by Does not apply route 2 (two) times daily. Test twice a day as directed, Disp: 100 each, Rfl: 2 .  pravastatin (PRAVACHOL) 10 MG tablet, Take 10 mg by mouth daily., Disp: , Rfl:  .  Vitamin D, Ergocalciferol, (DRISDOL) 50000 units CAPS capsule, Take 1 capsule (50,000 Units total) by mouth once a week. For 12 weeks, Disp: 12 capsule, Rfl: 0 .  Icosapent Ethyl (VASCEPA) 1 g CAPS, Take 2 capsules by mouth 2 (two) times daily with a meal., Disp: 360 capsule, Rfl: 0 .  metFORMIN (GLUCOPHAGE) 500 MG tablet, Take 1 tablet (500 mg total) by mouth 2 (two) times daily with a meal., Disp: 60 tablet, Rfl: 2  Current  Facility-Administered Medications:  .  triamcinolone acetonide (KENALOG) 10 MG/ML injection 10 mg, 10 mg, Other, Once, Landis Martins, DPM  Allergies  Allergen Reactions  . Aspirin   . Duloxetine Hcl   . Penicillins   . Codeine Nausea And Vomiting and Rash  . Lyrica [Pregabalin] Nausea And Vomiting  . Sulfa Antibiotics Swelling     Review of Systems  Constitutional: Positive for fatigue.  Eyes: Negative for blurred vision.  Respiratory: Negative for shortness of breath.   Cardiovascular: Negative for chest pain (has chest pain every night and informed her Cardiologist who ordered a stress test.) and palpitations.  Neurological: Negative for dizziness and headaches.  Endo/Heme/Allergies: Positive for polydipsia.  Psychiatric/Behavioral: The patient is not nervous/anxious.     Objective  Vitals:  07/06/17 1433  BP: 102/60  Pulse: 82  Resp: 12  Temp: 98.4 F (36.9 C)  TempSrc: Oral  SpO2: 95%  Weight: 163 lb 9.6 oz (74.2 kg)  Height: 5' 4"  (1.626 m)    Physical Exam  Constitutional: She is oriented to person, place, and time and well-developed, well-nourished, and in no distress.  HENT:  Head: Normocephalic and atraumatic.  Cardiovascular: Normal rate, regular rhythm and normal heart sounds.  No murmur heard. Pulmonary/Chest: Effort normal and breath sounds normal. She has no wheezes.  Abdominal: Soft. Bowel sounds are normal.  Neurological: She is alert and oriented to person, place, and time.  Psychiatric: Mood, memory, affect and judgment normal.  Nursing note and vitals reviewed.       Assessment & Plan  1. Uncontrolled type 2 diabetes mellitus with peripheral neuropathy [facility]) Patient will return next week to check A1c, no change in insulin treatment until then, advised on dietary lifestyle changes - metFORMIN (GLUCOPHAGE) 500 MG tablet; Take 1 tablet (500 mg total) by mouth 2 (two) times daily with a meal.  Dispense: 60 tablet; Refill: 2 - Insulin  Degludec-Liraglutide (XULTOPHY) 100-3.6 UNIT-MG/ML SOPN; Inject 50 Units into the skin at bedtime.  Dispense: 3 pen; Refill: 2  2. Essential hypertension Reviewedfrom cardiology, clonidine has been discontinued and losartan has been increased to 100 mg, blood pressure stable  3. Hx of transient ischemic attack (TIA) - clopidogrel (PLAVIX) 75 MG tablet; Take 1 tablet (75 mg total) by mouth daily.  Dispense: 90 tablet; Refill: 3  4. Mixed hyperlipidemia She has been started on pravastatin by the cardiologist, will obtain FLP - Lipid panel - COMPLETE METABOLIC PANEL WITH GFR  5. Generalized anxiety disorder Symptoms of anxiety are stable on alprazolam taken up to twice daily as needed, she will either seek consultation with a new PCP will need a referral to psychiatry for management of anxiety - ALPRAZolam (XANAX) 0.5 MG tablet; Take 1 tablet (0.5 mg total) by mouth 2 (two) times daily as needed.  Dispense: 60 tablet; Refill: 0   Correy Weidner [facility]ad [facility] 07/06/2017 2:58 PM

## 2017-07-06 NOTE — Progress Notes (Signed)
Subjective:   Charlotte Herrera is a 72 y.o. female who presents for Medicare Annual (Subsequent) preventive examination.  Review of Systems:  N/A Cardiac Risk Factors include: advanced age (>68mn, >>49women);diabetes mellitus;dyslipidemia;hypertension;sedentary lifestyle     Objective:     Vitals: BP 102/60 (BP Location: Left Arm, Patient Position: Sitting, Cuff Size: Normal)   Pulse 82   Temp 98.4 F (36.9 C) (Oral)   Resp 12   Ht _0  (1.626 m)   Wt 163 lb 9.6 oz (74.2 kg)   BMI 28.08 kg/m   Body mass index is 28.08 kg/m.  Advanced Directives 07/06/2017 04/06/2017 01/26/2017 12/29/2016 11/03/2016 09/29/2016 08/18/2016  Does Patient Have a Medical Advance Directive? _1  No No  Would patient like information on creating a medical advance directive? Yes (MAU/Ambulatory/Procedural Areas - Information given) - - - - - -   Pt has been provided with the "MOST" and "DNR" documents for her review. Pt has been advised that she will only need to complete the document that is most appropriate to suit her health care wishes. Once completed, pt has been advised to return the appropriate document to the office for the physician to review and sign. Verbalized acceptance and understanding.  Tobacco Social History   Tobacco Use  Smoking Status Never Smoker  Smokeless Tobacco Never Used  Tobacco Comment   smoking cessation materials not required     Counseling given: No Comment: smoking cessation materials not required  Clinical Intake:  Pre-visit preparation completed: Yes  Pain : 0-10(R groin pain) Pain Score: 5  Pain Type: Acute pain Pain Location: Groin Pain Orientation: Right Pain Radiating Towards: Radiates to abdominal region Pain Descriptors / Indicators: Burning, Throbbing Pain Onset: Yesterday Pain Frequency: Intermittent Pain Relieving Factors: heating pad and ice  BMI - recorded: 28.79 Nutritional Status: BMI 25 -29 Overweight Nutritional Risks: None Has the  patient had any N/V/D within the last 2 months?  Yes x2 weeks then resolved Does the patient have any non-healing wounds?  No Has the patient had any unintentional weight loss or weight gain?  No Is the patient diabetic?  Yes If diabetic, was a CBG obtained today?  No Did the patient bring in their glucometer from home?  No Comments: States she checks CBG's at home. No concerns Diabetes: Yes CBG done?: No Did pt. bring in CBG monitor from home?: No  How often do you need to have someone help you when you read instructions, pamphlets, or other written materials from your doctor or pharmacy?: 1 - Never  Interpreter Needed?: No  Information entered by :: AEversole, LPN  Past Medical History:  Diagnosis Date  . Depression   . Enlarged liver   . Fibromyalgia affecting multiple sites   . GERD (gastroesophageal reflux disease)   . Hyperlipidemia   . Hypertension   [place]   . Thyroid disease    Past Surgical History:  Procedure Laterality Date  . ABDOMINAL HYSTERECTOMY    . CARPAL TUNNEL RELEASE Left   . COLONOSCOPY  2015  . SPINE SURGERY    . VARICOSE VEIN SURGERY Left 1975   Family History  Problem Relation Age of Onset  . Cancer Mother   . Ovarian cancer Mother   . Diabetes Father   . Cancer Father   . Prostate cancer Father   . Heart disease Father   . Diabetes Brother   . Cancer Brother   . Breast cancer Maternal Aunt  51's  . Heart disease Daughter   . Healthy Daughter   . Healthy Daughter   . Ovarian cancer Maternal Aunt    Social History   Socioeconomic History  . Marital status: Divorced    Spouse name: None  . Number of children: 3  . Years of education: some college  . Highest education level: GED or equivalent  Social Needs  . Financial resource strain: Not hard at all  . Food insecurity - worry: Never true  . Food insecurity - inability: Never true  . Transportation needs - medical: No  . Transportation needs - non-medical: No    Occupational History  . Occupation: Surveyor, mining:  [employer]  Tobacco Use  . Smoking status: Never Smoker  . Smokeless tobacco: Never Used  . Tobacco comment: smoking cessation materials not required  Substance and Sexual Activity  . Alcohol use: No  . Drug use: No  . Sexual activity: No  Other Topics Concern  . None  Social History Narrative  . None    Outpatient Encounter Medications as of 07/06/2017  Medication Sig  . allopurinol (ZYLOPRIM) 100 MG tablet TAKE 1 TABLET(100 MG) BY MOUTH TWICE DAILY  . ALPRAZolam (XANAX) 0.5 MG tablet Take 1 tablet (0.5 mg total) by mouth 2 (two) times daily as needed.  . B-D UF III MINI PEN NEEDLES 31G X 5 MM MISC USE AS DIRECTED AT BEDTIME  . Blood Glucose Monitoring Suppl (ONE TOUCH ULTRA MINI) W/DEVICE KIT 1 Device by Does not apply route 2 (two) times daily.  . cetirizine (ZYRTEC) 10 MG tablet Take 10 mg by mouth daily.  [name] Kitchen DEXILANT 60 MG capsule TAKE 1 CAPSULE BY MOUTH DAILY  . fluticasone (FLONASE) 50 MCG/ACT nasal spray Place 2 sprays into both nostrils daily.  [name] Kitchen gabapentin (NEURONTIN) 300 MG capsule TAKE 1 CAPSULE(300 MG) BY MOUTH AT BEDTIME  . glucose blood (ONE TOUCH ULTRA TEST) test strip TEST twice a day  . Insulin Degludec-Liraglutide (XULTOPHY) 100-3.6 UNIT-MG/ML SOPN Inject 50 Units into the skin at bedtime.  [name] Kitchen ketoconazole (NIZORAL) 2 % cream Apply 1 application topically daily.  [name] Kitchen levothyroxine (SYNTHROID, LEVOTHROID) 50 MCG tablet TAKE 1 TABLET(50 MCG) BY MOUTH DAILY BEFORE BREAKFAST  . losartan-hydrochlorothiazide (HYZAAR) 100-25 MG tablet Take 1 tablet by mouth daily.  Glory Rosebush DELICA LANCETS 40J MISC 33 g by Does not apply route 2 (two) times daily. Test twice a day as directed  . pravastatin (PRAVACHOL) 10 MG tablet Take 10 mg by mouth daily.  . Vitamin D, Ergocalciferol, (DRISDOL) 50000 units CAPS capsule Take 1 capsule (50,000 Units total) by mouth once a week. For 12 weeks  . [DISCONTINUED] Insulin  Degludec-Liraglutide (XULTOPHY) 100-3.6 UNIT-MG/ML SOPN Inject 50 Units as directed at bedtime.  . cloNIDine (CATAPRES) 0.1 MG tablet TAKE 1 TABLET(0.1 MG) BY MOUTH TWICE DAILY  . Icosapent Ethyl (VASCEPA) 1 g CAPS Take 2 capsules by mouth 2 (two) times daily with a meal.  . metFORMIN (GLUCOPHAGE) 500 MG tablet Take 1 tablet (500 mg total) by mouth 2 (two) times daily with a meal.  . [DISCONTINUED] clopidogrel (PLAVIX) 75 MG tablet Take 1 tablet (75 mg total) by mouth daily.  . [DISCONTINUED] losartan-hydrochlorothiazide (HYZAAR) 50-12.5 MG tablet Take 1 tablet by mouth daily.   Facility-Administered Encounter Medications as of 07/06/2017  Medication  . triamcinolone acetonide (KENALOG) 10 MG/ML injection 10 mg    Activities of Daily Living In your present state of health, do you have  any difficulty performing the following activities: 07/06/2017 01/26/2017  Hearing? N N  Comment denies wearing hearing aids -  Vision? N Y  Comment wears reading glasses reading glasses  Difficulty concentrating or making decisions? Y N  Walking or climbing stairs? Y Y  Comment exhaustion, shortness of breath hip pain   Dressing or bathing? N N  Doing errands, shopping? N N  Preparing Food and eating ? N -  Comment denies wearing dentures -  Using the Toilet? N -  In the past six months, have you accidently leaked urine? N -  Do you have problems with loss of bowel control? N -  Managing your Medications? N -  Managing your Finances? Y -  Comment assitance from daughter or granddaughter -  Housekeeping or managing your Housekeeping? N -  Some recent data might be hidden    Patient Care Team: Roselee Nova, MD as PCP - General (Family Medicine) Marjean Donna, MD as Consulting Physician (Family Medicine)    Assessment:   This is a routine wellness examination for Nonna.  Exercise Activities and Dietary recommendations Current Exercise Habits: Home exercise routine, Type of exercise: walking, Time  (Minutes): 10, Frequency (Times/Week): 3, Weekly Exercise (Minutes/Week): 30, Intensity: Mild, Exercise limited by: orthopedic condition(s);Other - see comments(knee and back pain)  Goals    . DIET - INCREASE WATER INTAKE     Recommend to drink at least 6-8 8oz glasses of water per day        Fall Risk Fall Risk  07/06/2017 01/26/2017 12/29/2016 11/03/2016 09/29/2016  Falls in the past year? Yes No No No No  Comment tripped while wearing ortho boot - - - -  Number falls in past yr: 2 or more - - - -  Comment - - - - -  Injury with Fall? No - - - -  Comment - - - - -  Risk Factor Category  High Fall Risk - - - -  Risk for fall due to : Impaired balance/gait;Impaired mobility;Medication side effect - - - -  Risk for fall due to: Comment occasional dizziness - - - -  Follow up Education provided;Falls prevention discussed - - - -   Is the patient's home free of loose throw rugs in walkways, pet beds, electrical cords, etc?   yes      Grab bars in the bathroom? yes States she uses shower chair      Handrails on the stairs?   yes      Adequate lighting?   yes   Denies use of a walker and w/c. Does ambulate with walker on occasion. Elevated toilet seat  Timed Get Up and Go performed: Yes. 15 sec to ambulate 10 feet. Gait slow and steady. No intervention required.  Depression Screen PHQ 2/9 Scores 07/06/2017 01/26/2017 12/29/2016 11/03/2016  PHQ - 2 Score 6 0 0 0  PHQ- 9 Score 23 - - -     Cognitive Function     6CIT Screen 07/06/2017  What Year? 0 points  What month? 0 points  What time? 3 points  Count back from 20 0 points  Months in reverse 4 points  Repeat phrase 4 points  Total Score 11    Immunization History  Administered Date(s) Administered  . Influenza, High Dose Seasonal PF 03/11/2016, 04/06/2017  . Influenza,inj,Quad PF,6+ Mos 04/16/2015  . Pneumococcal Conjugate-13 07/06/2017    Qualifies for Shingles Vaccine? Yes. Due for Zostavax or Shingrix vaccine. Education has  been provided regarding the importance of this vaccine. Pt has been advised to call her insurance company to determine her out of pocket expense. Advised she may also receive this vaccine at her local pharmacy or Health Dept. Verbalized acceptance and understanding.  Screening Tests Health Maintenance  Topic Date Due  . OPHTHALMOLOGY EXAM  06/22/1956  . FOOT EXAM  04/29/2017  . TETANUS/TDAP  07/06/2018 (Originally 06/21/1964)  . HEMOGLOBIN A1C  10/05/2017  . MAMMOGRAM  12/29/2017  . PNA vac Low Risk Adult (2 of 2 - PPSV23) 07/06/2018  . Fecal DNA (Cologuard)  11/20/2019  . INFLUENZA VACCINE  Completed  . DEXA SCAN  Completed  . Hepatitis C Screening  Completed   Due for Tdap vaccine. Declined my offer to administer today. Education has been provided regarding the importance of this vaccine but still declined. Pt has been advised to call her insurance company to determine her out of pocket expense. Advised she may also receive this vaccine at her local pharmacy or Health Dept. Verbalized acceptance and understanding.  Cancer Screenings: Lung: Low Dose CT Chest recommended if Age 15-80 years, 30 pack-year currently smoking OR have quit w/in 15years. Patient does not qualify. NON-SMOKER Breast:  Up to date on Mammogram? Yes  Completed 12/31/16. Repeat every year Up to date of Bone Density/Dexa? Yes  Completed 12/29/16 Colorectal: Completed Cologuard 11/19/16. Repeat every 3 years  Additional Screenings: Hepatitis B/HIV/Syphillis: Does not qualify Hepatitis C Screening: Completed 11/03/16     Plan:   In preparation for this patient's Medicare Annual Wellness visit, I have personally reviewed the following information in the patient's chart:  A. Medical and Surgical History B. Office visits, Hospitalizations and ER visits within the last 12 months C. Radiology, Laboratory and Pathological Reports (including those records in [place]) D. Health Maintenance for accuracy and any attached,  scanned or relevant reports or letters E.  Immunization History F.  Upcoming referrals and appointments G. Advance directives and Code Status  I have personally addressed the Medicare Wellness Questionnaire with the patient and have noted and/or up dated the following information:  A.  Current medications (including OTC medications) B. Medication Allergies C. Medical and Surgical History D.  Physicians on the patient's care team [place] Physical activity G. Functional ability and status H. Nutritional status I. Risk for fall and preventative measures (including TUG test) J. Use of alcohol, tobacco or illicit drugs  K.          Screenings such as hearing, vision, cognitive and depression L. Advance Directives and Code Status M. Realistic Patient Goals N. Financial and Social strains, if any  In addition, I have reviewed and discussed with the patient, certain preventive protocols, quality metrics, and best practice recommendations. To ensure quality care and proper  continuity of care, any concerns that arose during the Medicare Wellness visit were addressed with the patient's physician. A written personalized care plan for preventive services as well as general preventive health recommendations were provided to patient.  Signed,  Aleatha Borer, LPN

## 2017-07-06 NOTE — Patient Instructions (Addendum)
Charlotte Herrera , Thank you for taking time to come for your Medicare Wellness Visit. I appreciate your ongoing commitment to your health goals. Please review the following plan we discussed and let me know if I can assist you in the future.   Screening recommendations/referrals: Colorectal Screening: Completed Cologuard 11/19/16. Repeat every 3 years Mammogram: Completed 12/31/16. Repeat every year Bone Density: Completed 12/29/16 Lung: You do not qualify. NON-SMOKER Hepatitis B/HIV/Syphillis: Does not qualify Hepatitis C Screening: Completed 11/03/16   Vision/Dental/Diabetic Exams: Diabetic Exams: Recommend annual diabetic eye exams for retinopathy and diabetic foot exams. Completed diabetic foot exam 04/29/16. Please schedule an appointment with your podiatrist.  Overdue for diabetic eye exam. Please call your ophthalmologist to schedule you diabetic eye exam. Recommended yearly dental visit for hygiene and checkup  Vaccinations: Influenza vaccine: Completed 04/06/17 Pneumococcal vaccine: Completed PCV13 today. Due for PPSV23 in 2020 Tdap vaccine: Declined. Please call your insurance company to determine your out of pocket expense. You may also receive this vaccine at your local pharmacy or Health Dept. Shingles vaccine: Declined. Please call your insurance company to determine your out of pocket expense. You may also receive this vaccine at your local pharmacy or Health Dept.  Advanced directives: Advance directive discussed with you today. I have provided a copy for you to complete at home and have notarized. Once this is complete please bring a copy in to our office so we can scan it into your chart.  Conditions/risks identified: Recommend to drink at least 6-8 8oz glasses of water per day  Next appointment: You are scheduled to see Dr. Manuella Ghazi on 07/06/17 @ 2:00pm.   Please schedule your Annual Wellness Visit with your Nurse Health Advisor in one year.  Preventive Care 73 Years and Older,  Female Preventive care refers to lifestyle choices and visits with your health care provider that can promote health and wellness. What does preventive care include?  A yearly physical exam. This is also called an annual well check.  Dental exams once or twice a year.  Routine eye exams. Ask your health care provider how often you should have your eyes checked.  Personal lifestyle choices, including:  Daily care of your teeth and gums.  Regular physical activity.  Eating a healthy diet.  Avoiding tobacco and drug use.  Limiting alcohol use.  Practicing safe sex.  Taking low-dose aspirin every day.  Taking vitamin and mineral supplements as recommended by your health care provider. What happens during an annual well check? The services and screenings done by your health care provider during your annual well check will depend on your age, overall health, lifestyle risk factors, and family history of disease. Counseling  Your health care provider may ask you questions about your:  Alcohol use.  Tobacco use.  Drug use.  Emotional well-being.  Home and relationship well-being.  Sexual activity.  Eating habits.  History of falls.  Memory and ability to understand (cognition).  Work and work Statistician.  Reproductive health. Screening  You may have the following tests or measurements:  Height, weight, and BMI.  Blood pressure.  Lipid and cholesterol levels. These may be checked every 5 years, or more frequently if you are over 73 years old.  Skin check.  Lung cancer screening. You may have this screening every year starting at age 73 if you have a 30-pack-year history of smoking and currently smoke or have quit within the past 15 years.  Fecal occult blood test (FOBT) of the stool. You may  have this test every year starting at age 38.  Flexible sigmoidoscopy or colonoscopy. You may have a sigmoidoscopy every 5 years or a colonoscopy every 10 years  starting at age 73.  Hepatitis C blood test.  Hepatitis B blood test.  Sexually transmitted disease (STD) testing.  Diabetes screening. This is done by checking your blood sugar (glucose) after you have not eaten for a while (fasting). You may have this done every 1-3 years.  Bone density scan. This is done to screen for osteoporosis. You may have this done starting at age 73.  Mammogram. This may be done every 1-2 years. Talk to your health care provider about how often you should have regular mammograms. Talk with your health care provider about your test results, treatment options, and if necessary, the need for more tests. Vaccines  Your health care provider may recommend certain vaccines, such as:  Influenza vaccine. This is recommended every year.  Tetanus, diphtheria, and acellular pertussis (Tdap, Td) vaccine. You may need a Td booster every 10 years.  Zoster vaccine. You may need this after age 73.  Pneumococcal 13-valent conjugate (PCV13) vaccine. One dose is recommended after age 73.  Pneumococcal polysaccharide (PPSV23) vaccine. One dose is recommended after age 73. Talk to your health care provider about which screenings and vaccines you need and how often you need them. This information is not intended to replace advice given to you by your health care provider. Make sure you discuss any questions you have with your health care provider. Document Released: 07/12/2015 Document Revised: 03/04/2016 Document Reviewed: 04/16/2015 Elsevier Interactive Patient Education  2017 [employer] Prevention in the Home Falls can cause injuries. They can happen to people of all ages. There are many things you can do to make your home safe and to help prevent falls. What can I do on the outside of my home?  Regularly fix the edges of walkways and driveways and fix any cracks.  Remove anything that might make you trip as you walk through a door, such as a raised step or  threshold.  Trim any bushes or trees on the path to your home.  Use bright outdoor lighting.  Clear any walking paths of anything that might make someone trip, such as rocks or tools.  Regularly check to see if handrails are loose or broken. Make sure that both sides of any steps have handrails.  Any raised decks and porches should have guardrails on the edges.  Have any leaves, snow, or ice cleared regularly.  Use sand or salt on walking paths during winter.  Clean up any spills in your garage right away. This includes oil or grease spills. What can I do in the bathroom?  Use night lights.  Install grab bars by the toilet and in the tub and shower. Do not use towel bars as grab bars.  Use non-skid mats or decals in the tub or shower.  If you need to sit down in the shower, use a plastic, non-slip stool.  Keep the floor dry. Clean up any water that spills on the floor as soon as it happens.  Remove soap buildup in the tub or shower regularly.  Attach bath mats securely with double-sided non-slip rug tape.  Do not have throw rugs and other things on the floor that can make you trip. What can I do in the bedroom?  Use night lights.  Make sure that you have a light by your bed that is easy  to reach.  Do not use any sheets or blankets that are too big for your bed. They should not hang down onto the floor.  Have a firm chair that has side arms. You can use this for support while you get dressed.  Do not have throw rugs and other things on the floor that can make you trip. What can I do in the kitchen?  Clean up any spills right away.  Avoid walking on wet floors.  Keep items that you use a lot in easy-to-reach places.  If you need to reach something above you, use a strong step stool that has a grab bar.  Keep electrical cords out of the way.  Do not use floor polish or wax that makes floors slippery. If you must use wax, use non-skid floor wax.  Do not have  throw rugs and other things on the floor that can make you trip. What can I do with my stairs?  Do not leave any items on the stairs.  Make sure that there are handrails on both sides of the stairs and use them. Fix handrails that are broken or loose. Make sure that handrails are as [place] as the stairways.  Check any carpeting to make sure that it is firmly attached to the stairs. Fix any carpet that is loose or worn.  Avoid having throw rugs at the top or bottom of the stairs. If you do have throw rugs, attach them to the floor with carpet tape.  Make sure that you have a light switch at the top of the stairs and the bottom of the stairs. If you do not have them, ask someone to add them for you. What else can I do to help prevent falls?  Wear shoes that:  Do not have high heels.  Have rubber bottoms.  Are comfortable and fit you well.  Are closed at the toe. Do not wear sandals.  If you use a stepladder:  Make sure that it is fully opened. Do not climb a closed stepladder.  Make sure that both sides of the stepladder are locked into place.  Ask someone to hold it for you, if possible.  Clearly mark and make sure that you can see:  Any grab bars or handrails.  First and last steps.  Where the edge of each step is.  Use tools that help you move around (mobility aids) if they are needed. These include:  Canes.  Walkers.  Scooters.  Crutches.  Turn on the lights when you go into a dark area. Replace any light bulbs as soon as they burn out.  Set up your furniture so you have a clear path. Avoid moving your furniture around.  If any of your floors are uneven, fix them.  If there are any pets around you, be aware of where they are.  Review your medicines with your doctor. Some medicines can make you feel dizzy. This can increase your chance of falling. Ask your doctor what other things that you can do to help prevent falls. This information is not intended to  replace advice given to you by your health care provider. Make sure you discuss any questions you have with your health care provider. Document Released: 04/11/2009 Document Revised: 11/21/2015 Document Reviewed: 07/20/2014 Elsevier Interactive Patient Education  2017 [employer].

## 2017-07-13 ENCOUNTER — Ambulatory Visit: Payer: PPO | Admitting: Family Medicine

## 2017-07-13 DIAGNOSIS — Z0389 Encounter for observation for other suspected diseases and conditions ruled out: Secondary | ICD-10-CM | POA: Diagnosis not present

## 2017-07-13 DIAGNOSIS — R0600 Dyspnea, unspecified: Secondary | ICD-10-CM | POA: Diagnosis not present

## 2017-07-13 DIAGNOSIS — Z794 Long term (current) use of insulin: Secondary | ICD-10-CM | POA: Diagnosis not present

## 2017-07-13 DIAGNOSIS — E119 Type 2 diabetes mellitus without complications: Secondary | ICD-10-CM | POA: Diagnosis not present

## 2017-07-13 DIAGNOSIS — R0609 Other forms of dyspnea: Secondary | ICD-10-CM | POA: Diagnosis not present

## 2017-07-16 ENCOUNTER — Other Ambulatory Visit: Payer: Self-pay | Admitting: *Deleted

## 2017-07-16 DIAGNOSIS — I6523 Occlusion and stenosis of bilateral carotid arteries: Secondary | ICD-10-CM

## 2017-08-02 ENCOUNTER — Ambulatory Visit: Payer: PPO | Admitting: Gastroenterology

## 2017-08-03 ENCOUNTER — Ambulatory Visit (INDEPENDENT_AMBULATORY_CARE_PROVIDER_SITE_OTHER): Payer: PPO | Admitting: Family Medicine

## 2017-08-03 ENCOUNTER — Encounter: Payer: Self-pay | Admitting: Family Medicine

## 2017-08-03 VITALS — BP 110/62 | HR 85 | Temp 97.9°F | Resp 14 | Ht 64.0 in | Wt 160.6 lb

## 2017-08-03 DIAGNOSIS — E038 Other specified hypothyroidism: Secondary | ICD-10-CM | POA: Diagnosis not present

## 2017-08-03 DIAGNOSIS — E1165 Type 2 diabetes mellitus with hyperglycemia: Secondary | ICD-10-CM | POA: Diagnosis not present

## 2017-08-03 DIAGNOSIS — R197 Diarrhea, unspecified: Secondary | ICD-10-CM

## 2017-08-03 DIAGNOSIS — E785 Hyperlipidemia, unspecified: Secondary | ICD-10-CM | POA: Diagnosis not present

## 2017-08-03 DIAGNOSIS — R1032 Left lower quadrant pain: Secondary | ICD-10-CM

## 2017-08-03 DIAGNOSIS — E1142 Type 2 diabetes mellitus with diabetic polyneuropathy: Secondary | ICD-10-CM | POA: Diagnosis not present

## 2017-08-03 DIAGNOSIS — IMO0002 Reserved for concepts with insufficient information to code with codable children: Secondary | ICD-10-CM

## 2017-08-03 LAB — GLUCOSE, POCT (MANUAL RESULT ENTRY): POC Glucose: 149 mg/dl — AB (ref 70–99)

## 2017-08-03 MED ORDER — LEVOTHYROXINE SODIUM 50 MCG PO TABS: ORAL_TABLET | ORAL | 0 refills | Status: DC

## 2017-08-03 NOTE — Progress Notes (Signed)
Name: Charlotte Herrera   MRN: [id number]    DOB: 09/28/1945   Date:08/03/2017       Progress Note  Subjective  Chief Complaint  Chief Complaint  Patient presents with  . Follow-up  . Medication Refill  . Fatigue    Pt asking to have her Vitamin D check again    Diabetes  She presents for her follow-up diabetic visit. She has type 2 diabetes mellitus. Her disease course has been stable. Hypoglycemia symptoms include dizziness (has dizzy spells when she first gets up in the morning. ). Pertinent negatives for hypoglycemia include no nervousness/anxiousness. Associated symptoms include fatigue and polydipsia. Pertinent negatives for diabetes include no foot paresthesias and no polyuria. Symptoms are stable. Diabetic complications include a CVA (had a TIA). Pertinent negatives for diabetic complications include no heart disease or peripheral neuropathy. Risk factors for coronary artery disease include diabetes mellitus, dyslipidemia and obesity. Current diabetic treatment includes insulin injections and oral agent (monotherapy). Her weight is stable. She is following a generally healthy and diabetic diet. She participates in exercise weekly. She monitors blood glucose at home 1-2 x per day. Her breakfast blood glucose range is generally 130-140 mg/dl. An ACE inhibitor/angiotensin II receptor blocker is being taken. Eye exam is current.   Patient reports having episodes of diarrhea (described as loose stools) every time she eats foods containing green leafy vegetables, symptoms present for almost 2 months, she denies any fever, chills, abdominal pain (has occasional lower abdominal pain) or bloating, she had hemorrhoids and constipation prior to onset of diarrhea but that's now resolved. She denies having diarrhea with any other foods such as meats or potatoes. She has tried and OTC anti-diarrhea pill.     Past Medical History:  Diagnosis Date  . Depression   . Enlarged liver   . Fibromyalgia affecting  multiple sites   . GERD (gastroesophageal reflux disease)   . Hyperlipidemia   . Hypertension   . Stroke ([place])   . Thyroid disease     Past Surgical History:  Procedure Laterality Date  . ABDOMINAL HYSTERECTOMY    . CARPAL TUNNEL RELEASE Left   . COLONOSCOPY  2015  . SPINE SURGERY    . VARICOSE VEIN SURGERY Left 1975    Family History  Problem Relation Age of Onset  . Cancer Mother   . Ovarian cancer Mother   . Diabetes Father   . Cancer Father   . Prostate cancer Father   . Heart disease Father   . Diabetes Brother   . Cancer Brother   . Breast cancer Maternal Aunt        40's  . Heart disease Daughter   . Healthy Daughter   . Healthy Daughter   . Ovarian cancer Maternal Aunt   . Liver cancer Maternal Grandmother   . Heart failure Maternal Grandfather     Social History   Socioeconomic History  . Marital status: Divorced    Spouse name: Not on file  . Number of children: 3  . Years of education: some college  . Highest education level: GED or equivalent  Social Needs  . Financial resource strain: Not hard at all  . Food insecurity - worry: Never true  . Food insecurity - inability: Never true  . Transportation needs - medical: No  . Transportation needs - non-medical: No  Occupational History  . Occupation: Surveyor, mining:  [employer]  Tobacco Use  . Smoking status: Never Smoker  .  Smokeless tobacco: Never Used  . Tobacco comment: smoking cessation materials not required  Substance and Sexual Activity  . Alcohol use: No  . Drug use: No  . Sexual activity: No  Other Topics Concern  . Not on file  Social History Narrative  . Not on file     Current Outpatient Medications:  .  allopurinol (ZYLOPRIM) 100 MG tablet, TAKE 1 TABLET(100 MG) BY MOUTH TWICE DAILY, Disp: 180 tablet, Rfl: 0 .  ALPRAZolam (XANAX) 0.5 MG tablet, Take 1 tablet (0.5 mg total) by mouth 2 (two) times daily as needed., Disp: 60 tablet, Rfl: 0 .  B-D UF III MINI PEN  NEEDLES 31G X 5 MM MISC, USE AS DIRECTED AT BEDTIME, Disp: 100 each, Rfl: 0 .  Blood Glucose Monitoring Suppl (ONE TOUCH ULTRA MINI) W/DEVICE KIT, 1 Device by Does not apply route 2 (two) times daily., Disp: 1 each, Rfl: 0 .  cetirizine (ZYRTEC) 10 MG tablet, Take 10 mg by mouth daily., Disp: , Rfl:  .  clopidogrel (PLAVIX) 75 MG tablet, Take 1 tablet (75 mg total) by mouth daily., Disp: 90 tablet, Rfl: 3 .  DEXILANT 60 MG capsule, TAKE 1 CAPSULE BY MOUTH DAILY, Disp: 30 capsule, Rfl: 6 .  fluticasone (FLONASE) 50 MCG/ACT nasal spray, Place 2 sprays into both nostrils daily., Disp: 16 g, Rfl: 2 .  glucose blood (ONE TOUCH ULTRA TEST) test strip, TEST twice a day, Disp: 180 each, Rfl: 3 .  Insulin Degludec-Liraglutide (XULTOPHY) 100-3.6 UNIT-MG/ML SOPN, Inject 50 Units into the skin at bedtime., Disp: 3 pen, Rfl: 2 .  ketoconazole (NIZORAL) 2 % cream, Apply 1 application topically daily., Disp: 60 g, Rfl: 2 .  levothyroxine (SYNTHROID, LEVOTHROID) 50 MCG tablet, TAKE 1 TABLET(50 MCG) BY MOUTH DAILY BEFORE BREAKFAST, Disp: 90 tablet, Rfl: 0 .  losartan-hydrochlorothiazide (HYZAAR) 100-25 MG tablet, Take 1 tablet by mouth daily., Disp: , Rfl:  .  metFORMIN (GLUCOPHAGE) 500 MG tablet, Take 1 tablet (500 mg total) by mouth 2 (two) times daily with a meal., Disp: 60 tablet, Rfl: 2 .  ONETOUCH DELICA LANCETS 25W MISC, 33 g by Does not apply route 2 (two) times daily. Test twice a day as directed, Disp: 100 each, Rfl: 2 .  pravastatin (PRAVACHOL) 10 MG tablet, Take 10 mg by mouth daily., Disp: , Rfl:  .  Vitamin D, Ergocalciferol, (DRISDOL) 50000 units CAPS capsule, Take 1 capsule (50,000 Units total) by mouth once a week. For 12 weeks (Patient not taking: Reported on 08/03/2017), Disp: 12 capsule, Rfl: 0  Current Facility-Administered Medications:  .  triamcinolone acetonide (KENALOG) 10 MG/ML injection 10 mg, 10 mg, Other, Once, Landis Martins, DPM  Allergies  Allergen Reactions  . Aspirin   .  Duloxetine Hcl   . Penicillins   . Codeine Nausea And Vomiting and Rash  . Lyrica [Pregabalin] Nausea And Vomiting  . Sulfa Antibiotics Swelling     Review of Systems  Constitutional: Positive for fatigue.  Neurological: Positive for dizziness (has dizzy spells when she first gets up in the morning. ).  Endo/Heme/Allergies: Positive for polydipsia.  Psychiatric/Behavioral: The patient is not nervous/anxious.     Objective  Vitals:   08/03/17 1136  BP: 110/62  Pulse: 85  Resp: 14  Temp: 97.9 F (36.6 C)  TempSrc: Oral  SpO2: 98%  Weight: 160 lb 9.6 oz (72.8 kg)  Height: 5' 4"  (1.626 m)    Physical Exam  Constitutional: She is oriented to person, place, and time and  well-developed, well-nourished, and in no distress.  HENT:  Head: Normocephalic and atraumatic.  Cardiovascular: Normal rate, regular rhythm and normal heart sounds.  No murmur heard. Pulmonary/Chest: Effort normal and breath sounds normal. She has no wheezes.  Abdominal: Soft. There is tenderness in the right upper quadrant, right lower quadrant, suprapubic area and left lower quadrant.  Neurological: She is alert and oriented to person, place, and time.  Psychiatric: Mood, memory, affect and judgment normal.  Nursing note and vitals reviewed.    Recent Results (from the past 2160 hour(s))  POCT Glucose (CBG)     Status: Abnormal   Collection Time: 08/03/17 11:27 AM  Result Value Ref Range   POC Glucose 149 (A) 70 - 99 mg/dl     Assessment & Plan  1. Uncontrolled type 2 diabetes mellitus with peripheral neuropathy (HCC) Obtain hemoglobin A1c continue on present treatment - POCT Glucose (CBG) - HgB A1c  2. Other specified hypothyroidism  - levothyroxine (SYNTHROID, LEVOTHROID) 50 MCG tablet; TAKE 1 TABLET(50 MCG) BY MOUTH DAILY BEFORE BREAKFAST  Dispense: 90 tablet; Refill: 0 - TSH  3. Dyslipidemia  - Lipid panel  4. Left lower quadrant pain Obtain CT scan of abdomen and pelvis for  evaluation, urine to rule out an infection - Urinalysis, Routine w reflex microscopic - CT Abdomen Pelvis Wo Contrast; Future - CBC with Differential/Platelet  5. Diarrhea, unspecified type  - Urinalysis, Routine w reflex microscopic - Stool Culture - Stool Giardia/Cryptosporidium - Stool C-Diff Toxin Assay   Dakai Braithwaite [facility] 08/03/2017 11:48 AM

## 2017-08-04 DIAGNOSIS — G459 Transient cerebral ischemic attack, unspecified: Secondary | ICD-10-CM | POA: Diagnosis not present

## 2017-08-04 LAB — CBC WITH DIFFERENTIAL/PLATELET
Basophils Absolute: 53 cells/uL (ref 0–200)
Basophils Relative: 0.7 %
Eosinophils Absolute: 198 cells/uL (ref 15–500)
Eosinophils Relative: 2.6 %
HCT: 37.5 % (ref 35.0–45.0)
Hemoglobin: 12.9 g/dL (ref 11.7–15.5)
Lymphs Abs: 3184 cells/uL (ref 850–3900)
MCH: 31.9 pg (ref 27.0–33.0)
MCHC: 34.4 g/dL (ref 32.0–36.0)
MCV: 92.8 fL (ref 80.0–100.0)
MPV: 10.2 fL (ref 7.5–12.5)
Monocytes Relative: 6.7 %
Neutro Abs: 3656 cells/uL (ref 1500–7800)
Neutrophils Relative %: 48.1 %
Platelets: 158 10*3/uL (ref 140–400)
RBC: 4.04 10*6/uL (ref 3.80–5.10)
RDW: 12.6 % (ref 11.0–15.0)
Total Lymphocyte: 41.9 %
WBC mixed population: 509 cells/uL (ref 200–950)
WBC: 7.6 10*3/uL (ref 3.8–10.8)

## 2017-08-04 LAB — URINALYSIS, ROUTINE W REFLEX MICROSCOPIC
Bilirubin Urine: NEGATIVE
Glucose, UA: NEGATIVE
Hgb urine dipstick: NEGATIVE
Hyaline Cast: NONE SEEN /LPF
Nitrite: POSITIVE — AB
Protein, ur: NEGATIVE
Specific Gravity, Urine: 1.021 (ref 1.001–1.03)
pH: <=5 (ref 5.0–8.0)

## 2017-08-04 LAB — TSH: TSH: 1.04 mIU/L (ref 0.40–4.50)

## 2017-08-04 LAB — HEMOGLOBIN A1C
Hgb A1c MFr Bld: 7.3 % of total Hgb — ABNORMAL HIGH (ref <5.7)
Mean Plasma Glucose: 163 (calc)
eAG (mmol/L): 9 (calc)

## 2017-08-04 LAB — LIPID PANEL
Cholesterol: 147 mg/dL (ref <200)
HDL: 51 mg/dL (ref >50)
LDL Cholesterol (Calc): 75 mg/dL (calc)
Non-HDL Cholesterol (Calc): 96 mg/dL (calc) (ref <130)
Total CHOL/HDL Ratio: 2.9 (calc) (ref <5.0)
Triglycerides: 127 mg/dL (ref <150)

## 2017-08-09 ENCOUNTER — Telehealth: Payer: Self-pay | Admitting: Family Medicine

## 2017-08-09 DIAGNOSIS — R197 Diarrhea, unspecified: Secondary | ICD-10-CM | POA: Diagnosis not present

## 2017-08-09 NOTE — Telephone Encounter (Signed)
CT scan abdomen and pelvis ordered for evaluation of left lower quadrant abdominal pain to rule out diverticulosis and other potential etiologies.  If her pain as above and/or she has no additional symptoms, she can consider not having the CT scan at this time.

## 2017-08-09 NOTE — Telephone Encounter (Signed)
Copied from [place]. Topic: Quick Communication - See Telephone Encounter >> Aug 09, 2017  3:23 PM Cleaster Corin, [place] wrote: CRM for notification. See Telephone encounter for:   08/09/17. Pt. calling to see if she needed to have a ct scan. Pt can be reached at [phone]

## 2017-08-10 LAB — GIARDIA/CRYPTOSPORIDIUM (EIA)
MICRO NUMBER:: 90179947
MICRO NUMBER:: 90179948
RESULT:: NOT DETECTED
RESULT:: NOT DETECTED
SPECIMEN QUALITY:: ADEQUATE
SPECIMEN QUALITY:: ADEQUATE

## 2017-08-11 ENCOUNTER — Other Ambulatory Visit: Payer: Self-pay | Admitting: Family Medicine

## 2017-08-11 ENCOUNTER — Telehealth: Payer: Self-pay | Admitting: Family Medicine

## 2017-08-11 NOTE — Telephone Encounter (Signed)
Copied from [place]. Topic: Quick Communication - See Telephone Encounter >> Aug 11, 2017  1:37 PM Burnis Medin, NT wrote: CRM for notification. See Telephone encounter for: Patient called and said insurance want approve Insulin Degludec-Liraglutide (XULTOPHY) 100-3.6 UNIT-MG/ML SOPN. Patient wanted to see if the doctor could send in another prescription for more pens because it is not lasting the entire 30 days. Pt uses [facility] [place], [address] [phone] (Phone) [phone] (Fax)    08/11/17.

## 2017-08-12 DIAGNOSIS — G459 Transient cerebral ischemic attack, unspecified: Secondary | ICD-10-CM | POA: Diagnosis not present

## 2017-08-12 NOTE — Telephone Encounter (Signed)
Spoke the patient she states she have not herd anything about regarding the CT scan. Patient also mention she would like to have tuesdays that is when she is free.  The scan was order on 0205/2019. I will ask our referral coordinator to see if she can look further.

## 2017-08-12 NOTE — Telephone Encounter (Signed)
PA was done online via CoverMyMeds, awaiting for response.

## 2017-08-13 LAB — STOOL CULTURE
MICRO NUMBER:: 90179944
MICRO NUMBER:: 90179945
MICRO NUMBER:: 90179946
SHIGA RESULT:: NOT DETECTED
SPECIMEN QUALITY:: ADEQUATE
SPECIMEN QUALITY:: ADEQUATE
SPECIMEN QUALITY:: ADEQUATE

## 2017-08-13 NOTE — Telephone Encounter (Signed)
Pt.notified

## 2017-08-13 NOTE — Telephone Encounter (Signed)
Patient has been scheduled for Tuesday, September 07, 2017 @ 8am at the [facility]  She is to arrive at 7:45am and not to have anything to eat or drink 4 hours prior and to pick up a prep kit anytime before her scheduled imaging.

## 2017-08-14 DIAGNOSIS — N3000 Acute cystitis without hematuria: Secondary | ICD-10-CM | POA: Diagnosis not present

## 2017-08-14 DIAGNOSIS — R9341 Abnormal radiologic findings on diagnostic imaging of renal pelvis, ureter, or bladder: Secondary | ICD-10-CM | POA: Diagnosis not present

## 2017-08-14 DIAGNOSIS — R1012 Left upper quadrant pain: Secondary | ICD-10-CM | POA: Diagnosis not present

## 2017-08-14 DIAGNOSIS — Z79899 Other long term (current) drug therapy: Secondary | ICD-10-CM | POA: Diagnosis not present

## 2017-08-16 ENCOUNTER — Telehealth: Payer: Self-pay

## 2017-08-16 NOTE — Telephone Encounter (Signed)
Copied from [place] [phone]. Topic: Bill or Statement - Patient/Guarantor Inquiry >> Aug 13, 2017  3:43 PM Vernona Rieger wrote: Patient said that she got a letter in the mail from [employer] and stated that they denied her OV on 07/06/17. They told her that it was denied because of some procedure code. Please advise. Call back is [phone] >> Aug 16, 2017  9:04 AM Jerene Dilling H wrote: I left message on VM.[name] Kitchen Trixie Rude has added 25 modifier to [id number] and rebilled for payment.

## 2017-08-31 ENCOUNTER — Ambulatory Visit: Payer: PPO | Admitting: Family Medicine

## 2017-08-31 DIAGNOSIS — R42 Dizziness and giddiness: Secondary | ICD-10-CM | POA: Diagnosis not present

## 2017-08-31 DIAGNOSIS — I6381 Other cerebral infarction due to occlusion or stenosis of small artery: Secondary | ICD-10-CM | POA: Diagnosis not present

## 2017-08-31 DIAGNOSIS — K219 Gastro-esophageal reflux disease without esophagitis: Secondary | ICD-10-CM | POA: Diagnosis not present

## 2017-08-31 DIAGNOSIS — Z7902 Long term (current) use of antithrombotics/antiplatelets: Secondary | ICD-10-CM | POA: Diagnosis not present

## 2017-08-31 DIAGNOSIS — Z882 Allergy status to sulfonamides status: Secondary | ICD-10-CM | POA: Diagnosis not present

## 2017-08-31 DIAGNOSIS — E119 Type 2 diabetes mellitus without complications: Secondary | ICD-10-CM | POA: Diagnosis not present

## 2017-08-31 DIAGNOSIS — Z880 Allergy status to penicillin: Secondary | ICD-10-CM | POA: Diagnosis not present

## 2017-08-31 DIAGNOSIS — I10 Essential (primary) hypertension: Secondary | ICD-10-CM | POA: Diagnosis not present

## 2017-08-31 DIAGNOSIS — Z885 Allergy status to narcotic agent status: Secondary | ICD-10-CM | POA: Diagnosis not present

## 2017-08-31 DIAGNOSIS — F329 Major depressive disorder, single episode, unspecified: Secondary | ICD-10-CM | POA: Diagnosis not present

## 2017-08-31 DIAGNOSIS — Z8673 Personal history of transient ischemic attack (TIA), and cerebral infarction without residual deficits: Secondary | ICD-10-CM | POA: Diagnosis not present

## 2017-08-31 DIAGNOSIS — R202 Paresthesia of skin: Secondary | ICD-10-CM | POA: Diagnosis not present

## 2017-08-31 DIAGNOSIS — R200 Anesthesia of skin: Secondary | ICD-10-CM | POA: Diagnosis not present

## 2017-08-31 DIAGNOSIS — Z886 Allergy status to analgesic agent status: Secondary | ICD-10-CM | POA: Diagnosis not present

## 2017-08-31 DIAGNOSIS — Z79899 Other long term (current) drug therapy: Secondary | ICD-10-CM | POA: Diagnosis not present

## 2017-08-31 DIAGNOSIS — E785 Hyperlipidemia, unspecified: Secondary | ICD-10-CM | POA: Diagnosis not present

## 2017-08-31 DIAGNOSIS — Z87891 Personal history of nicotine dependence: Secondary | ICD-10-CM | POA: Diagnosis not present

## 2017-08-31 DIAGNOSIS — Z794 Long term (current) use of insulin: Secondary | ICD-10-CM | POA: Diagnosis not present

## 2017-09-03 ENCOUNTER — Ambulatory Visit (INDEPENDENT_AMBULATORY_CARE_PROVIDER_SITE_OTHER): Payer: PPO | Admitting: Family Medicine

## 2017-09-03 ENCOUNTER — Encounter: Payer: Self-pay | Admitting: Family Medicine

## 2017-09-03 VITALS — BP 112/62 | HR 97 | Temp 97.9°F | Wt 160.1 lb

## 2017-09-03 DIAGNOSIS — E038 Other specified hypothyroidism: Secondary | ICD-10-CM

## 2017-09-03 DIAGNOSIS — F329 Major depressive disorder, single episode, unspecified: Secondary | ICD-10-CM | POA: Insufficient documentation

## 2017-09-03 DIAGNOSIS — R454 Irritability and anger: Secondary | ICD-10-CM | POA: Diagnosis not present

## 2017-09-03 DIAGNOSIS — M1A9XX0 Chronic gout, unspecified, without tophus (tophi): Secondary | ICD-10-CM

## 2017-09-03 DIAGNOSIS — I700 Atherosclerosis of aorta: Secondary | ICD-10-CM

## 2017-09-03 DIAGNOSIS — K7469 Other cirrhosis of liver: Secondary | ICD-10-CM | POA: Diagnosis not present

## 2017-09-03 DIAGNOSIS — E1142 Type 2 diabetes mellitus with diabetic polyneuropathy: Secondary | ICD-10-CM

## 2017-09-03 DIAGNOSIS — E1165 Type 2 diabetes mellitus with hyperglycemia: Secondary | ICD-10-CM | POA: Diagnosis not present

## 2017-09-03 DIAGNOSIS — F411 Generalized anxiety disorder: Secondary | ICD-10-CM | POA: Insufficient documentation

## 2017-09-03 DIAGNOSIS — K7460 Unspecified cirrhosis of liver: Secondary | ICD-10-CM

## 2017-09-03 DIAGNOSIS — F331 Major depressive disorder, recurrent, moderate: Secondary | ICD-10-CM

## 2017-09-03 DIAGNOSIS — Z8673 Personal history of transient ischemic attack (TIA), and cerebral infarction without residual deficits: Secondary | ICD-10-CM | POA: Diagnosis not present

## 2017-09-03 DIAGNOSIS — IMO0002 Reserved for concepts with insufficient information to code with codable children: Secondary | ICD-10-CM

## 2017-09-03 HISTORY — DX: Unspecified cirrhosis of liver: K74.60

## 2017-09-03 HISTORY — DX: Atherosclerosis of aorta: I70.0

## 2017-09-03 MED ORDER — LOSARTAN POTASSIUM 100 MG PO TABS: 100.0000 mg | ORAL_TABLET | Freq: Every day | ORAL | 3 refills | Status: DC

## 2017-09-03 MED ORDER — PANTOPRAZOLE SODIUM 20 MG PO TBEC: 20.0000 mg | DELAYED_RELEASE_TABLET | Freq: Every day | ORAL | 0 refills | Status: DC

## 2017-09-03 MED ORDER — LEVOTHYROXINE SODIUM 50 MCG PO TABS: ORAL_TABLET | ORAL | 0 refills | Status: DC

## 2017-09-03 MED ORDER — PRAVASTATIN SODIUM 10 MG PO TABS: 10.0000 mg | ORAL_TABLET | Freq: Every day | ORAL | 0 refills | Status: DC

## 2017-09-03 MED ORDER — INSULIN DEGLUDEC-LIRAGLUTIDE 100-3.6 UNIT-MG/ML ~~LOC~~ SOPN: 40.0000 [IU] | PEN_INJECTOR | Freq: Every day | SUBCUTANEOUS | 2 refills | Status: DC

## 2017-09-03 MED ORDER — ALLOPURINOL 100 MG PO TABS: ORAL_TABLET | ORAL | 0 refills | Status: DC

## 2017-09-03 NOTE — Assessment & Plan Note (Addendum)
With portal hypertension; notify Dr. Allen Norris; have her follow-up with him as needed

## 2017-09-03 NOTE — Assessment & Plan Note (Signed)
On plavix

## 2017-09-03 NOTE — Progress Notes (Signed)
BP 112/62 (BP Location: Right Arm, Patient Position: Sitting, Cuff Size: Normal)   Pulse 97   Temp 97.9 F (36.6 C) (Oral)   Wt 160 lb 1.6 oz (72.6 kg)   SpO2 98%   BMI 27.48 kg/m    Subjective:    Patient ID: Charlotte Herrera, female    DOB: 06-02-1946, 72 y.o.   MRN: [id number]  HPI: Charlotte Herrera is a 72 y.o. female  Chief Complaint  Patient presents with  . Diabetes    Pt states that sugars are dropping very low, as low as 9 and 60, having trouble getting insulin filled as well   . Hospitalization Follow-up    2 recent ER visits for UTI and GERD   . Dizziness  . Insomnia    Having trouble sleeping since daughter died, she found her, has tried melatonin   . Anorexia    Pt states that she is not eating well     HPI Patient is here to establish care with me, as her last provider left our practice  She lost her daughter about a year ago; found her and still dealing with the grief; anxiety about a lot of things; depression, even before loss of daughter; depression and anxiety run in the family; has been off of xanax for a few weeks; she has not been on SSRI for years, maybe zoloft with Dr. Jacqualine Code; no sleeping but maybe 3 hours a night, maybe 4-5 am until 9 am  Two ER visit (March 5 and Feb 16)  CT scan; ordered by not done here, but done at [facility] on Feb 16th   EXAM: CT abdomen and pelvis with contrast DATE: 08/14/2017 3:58 PM ACCESSION: [id number] UN DICTATED: 08/14/2017 4:28 PM INTERPRETATION LOCATION: [place]  CLINICAL INDICATION: 72 years old Female with ABDOMINAL PAIN, (specify site in comments)--    COMPARISON: None  TECHNIQUE: A spiral CT scan was obtained with IV contrast from the lung bases to the pubic symphysis.  Images were reconstructed in the axial plane. Coronal and sagittal reformatted images were also provided for further evaluation.  FINDINGS: LOWER CHEST: Calcified right lower lobe granulomas. Mild left basilar dependent atelectasis. Coronary artery  disease.  HEPATOBILIARY: Hepatic dome calcification, likely a calcified granuloma. No biliary ductal dilatation.  GALLBLADDER: Unremarkable. SPLEEN: Punctate calcified splenic granulomas. PANCREAS: Fatty atrophy of the pancreas. ADRENAL GLANDS: Unremarkable. KIDNEYS/URETERS: Symmetric nephrograms. No hydronephrosis. Punctate left lower pole parenchymal calcification (3:68). No definite nephrolithiasis Small bilateral renal cysts. Additional subcentimeter renal hypodensities, too small to characterize BLADDER: Circumferential bladder wall thickening, likely accentuated by underdistention. Nonspecific calcification in the dome of the bladder. REPRODUCTIVE: Uterus is surgically absent. No suspicious adnexal masses. Unremarkable ovaries. BOWEL/PERITONEUM: No bowel obstruction. Oral contrast seen as far as the distal transverse colon. No acute inflammatory process. Sigmoid diverticulosis without evidence of diverticulitis. Appendix is not definitively visualized. No secondary signs of appendicitis. No ascites. VASCULATURE: Aorta and major branch vessels are patent. Scattered atheromatous calcification of the abdominal aorta. Splenic vein, portal vein, and SVC are patent. Unremarkable IVC. LYMPH NODES: No adenopathy.  BONES:No acute osseous abnormality. Degenerative changes of the lumbar spine. SOFT TISSUES: Unremarkable.   IMPRESSION: --No acute pathology within the abdomen or pelvis.  --Circumferential bladder wall thickening, likely secondary to underdistention, though cystitis, similar appearance. Recommend correlation with urinalysis.  --Additional chronic/incidental findings, as described above.  ATTENDING ADDENDUM: --Mild nodular hepatic contour, probably related to cirrhosis.  --Splenic varices and mild splenomegaly, indicative of portal hypertension.  Type 2 diabetes,  xultophy Having sugar drops every morning this week Lab Results  Component Value Date   HGBA1C 7.3 (H) 08/03/2017    Chart review also shows: CKD mild Elev SGOT, 89 and 54  HTN; on medicine  High cholesterol, CAD; on statin  GERD; on PPI  Depression screen [facility] 2/9 09/03/2017 08/03/2017 07/06/2017 01/26/2017 12/29/2016  Decreased Interest 1 1 3  0 0  Down, Depressed, Hopeless 1 1 3  0 0  PHQ - 2 Score 2 2 6  0 0  Altered sleeping 3 1 3  - -  Tired, decreased energy 3 1 3  - -  Change in appetite 3 1 1  - -  Feeling bad or failure about yourself  0 1 3 - -  Trouble concentrating 1 1 3  - -  Moving slowly or fidgety/restless 0 1 3 - -  Suicidal thoughts 0 0 1 - -  PHQ-9 Score 12 8 23  - -  Difficult doing work/chores Very difficult Not difficult at all Very difficult - -    Relevant past medical, surgical, family and social history reviewed Past Medical History:  Diagnosis Date  . Calcification of abdominal aorta (HCC) 09/03/2017   CT scan [facility] Feb 2019  . Cirrhosis ([place]) 09/03/2017   Chest CT [facility] Feb 2019  . Depression   . Enlarged liver   . Fibromyalgia affecting multiple sites   . GERD (gastroesophageal reflux disease)   . Hyperlipidemia   . Hypertension   . Stroke ([place])   . Thyroid disease    Past Surgical History:  Procedure Laterality Date  . ABDOMINAL HYSTERECTOMY    . CARPAL TUNNEL RELEASE Left   . COLONOSCOPY  2015  . SPINE SURGERY    . VARICOSE VEIN SURGERY Left 1975   Family History  Problem Relation Age of Onset  . Cancer Mother   . Ovarian cancer Mother   . Diabetes Father   . Cancer Father   . Prostate cancer Father   . Heart disease Father   . Diabetes Brother   . Cancer Brother   . Breast cancer Maternal Aunt        40's  . Heart disease Daughter   . Healthy Daughter   . Healthy Daughter   . Ovarian cancer Maternal Aunt   . Liver cancer Maternal Grandmother   . Heart failure Maternal Grandfather    Social History   Tobacco Use  . Smoking status: Never Smoker  . Smokeless tobacco: Never Used  . Tobacco comment: smoking cessation materials not required  Substance  Use Topics  . Alcohol use: No  . Drug use: No    Interim medical history since last visit reviewed. Allergies and medications reviewed  Review of Systems Per HPI unless specifically indicated above     Objective:    BP 112/62 (BP Location: Right Arm, Patient Position: Sitting, Cuff Size: Normal)   Pulse 97   Temp 97.9 F (36.6 C) (Oral)   Wt 160 lb 1.6 oz (72.6 kg)   SpO2 98%   BMI 27.48 kg/m   Wt Readings from Last 3 Encounters:  09/03/17 160 lb 1.6 oz (72.6 kg)  08/03/17 160 lb 9.6 oz (72.8 kg)  07/06/17 163 lb 9.6 oz (74.2 kg)    Physical Exam  Constitutional: She appears well-developed and well-nourished. No distress.  HENT:  Head: Normocephalic and atraumatic.  Eyes: EOM are normal. No scleral icterus.  Neck: No thyromegaly present.  Cardiovascular: Normal rate, regular rhythm and normal heart sounds.  No murmur heard.  Pulmonary/Chest: Effort normal and breath sounds normal. No respiratory distress. She has no wheezes.  Abdominal: Soft. Bowel sounds are normal. She exhibits no distension.  Musculoskeletal: Normal range of motion. She exhibits no edema.  Neurological: She is alert. She exhibits normal muscle tone.  Skin: Skin is warm and dry. She is not diaphoretic. No pallor.  Psychiatric: She has a normal mood and affect. Her behavior is normal. Judgment and thought content normal.   Diabetic Foot Form - Detailed   Diabetic Foot Exam - detailed Diabetic Foot exam was performed with the following findings:  Yes 09/03/2017  4:35 PM  Visual Foot Exam completed.:  Yes  Pulse Foot Exam completed.:  Yes  Sensory Foot Exam Completed.:  Yes Semmes-Weinstein Monofilament Test       Results for orders placed or performed in visit on 08/03/17  Stool Culture  Result Value Ref Range   MICRO NUMBER: [id number]    SPECIMEN QUALITY: ADEQUATE    SOURCE: STOOL    STATUS: FINAL    SHIGA RESULT: Not Detected    MICRO NUMBER: [id number]    SPECIMEN QUALITY: ADEQUATE     Source STOOL    STATUS: FINAL    CAM RESULT: No enteric Campylobacter isolated    MICRO NUMBER: [id number]    SPECIMEN QUALITY: ADEQUATE    SOURCE: STOOL    STATUS: FINAL    SS RESULT: No Salmonella or Shigella isolated   Stool Giardia/Cryptosporidium  Result Value Ref Range   MICRO NUMBER: [id number]    SPECIMEN QUALITY: ADEQUATE    Source: STOOL    STATUS: FINAL    RESULT: Not Detected    COMMENT:      NOTE: Due to intermittent shedding, one negative sample does not necessarily rule out the presence of a parasitic infection.   MICRO NUMBER: [id number]    SPECIMEN QUALITY: ADEQUATE    Source: STOOL    STATUS: FINAL    RESULT: Not Detected    COMMENT:      NOTE: Due to intermittent shedding, one negative sample does not necessarily rule out the presence of a parasitic infection.  TSH  Result Value Ref Range   TSH 1.04 0.40 - 4.50 mIU/L  HgB A1c  Result Value Ref Range   Hgb A1c MFr Bld 7.3 (H) <5.7 % of total Hgb   Mean Plasma Glucose 163 (calc)   eAG (mmol/L) 9.0 (calc)  Lipid panel  Result Value Ref Range   Cholesterol 147 <200 mg/dL   HDL 51 >50 mg/dL   Triglycerides 127 <150 mg/dL   LDL Cholesterol (Calc) 75 mg/dL (calc)   Total CHOL/HDL Ratio 2.9 <5.0 (calc)   Non-HDL Cholesterol (Calc) 96 <130 mg/dL (calc)  Urinalysis, Routine w reflex microscopic  Result Value Ref Range   Color, Urine DARK YELLOW YELLOW   APPearance CLOUDY (A) CLEAR   Specific Gravity, Urine 1.021 1.001 - 1.03   pH < OR = 5.0 5.0 - 8.0   Glucose, UA NEGATIVE NEGATIVE   Bilirubin Urine NEGATIVE NEGATIVE   Ketones, ur TRACE (A) NEGATIVE   Hgb urine dipstick NEGATIVE NEGATIVE   Protein, ur NEGATIVE NEGATIVE   Nitrite POSITIVE (A) NEGATIVE   Leukocytes, UA 3+ (A) NEGATIVE   WBC, UA 20-40 (A) 0 - 5 /HPF   RBC / HPF 0-2 0 - 2 /HPF   Squamous Epithelial / LPF 0-5 < OR = 5 /HPF   Bacteria, UA MANY (A) NONE SEEN /HPF   Hyaline Cast NONE SEEN NONE  SEEN /LPF  CBC with Differential/Platelet  Result  Value Ref Range   WBC 7.6 3.8 - 10.8 Thousand/uL   RBC 4.04 3.80 - 5.10 Million/uL   Hemoglobin 12.9 11.7 - 15.5 g/dL   HCT 37.5 35.0 - 45.0 %   MCV 92.8 80.0 - 100.0 fL   MCH 31.9 27.0 - 33.0 pg   MCHC 34.4 32.0 - 36.0 g/dL   RDW 12.6 11.0 - 15.0 %   Platelets 158 140 - 400 Thousand/uL   MPV 10.2 7.5 - 12.5 fL   Neutro Abs 3,656 1,500 - 7,800 cells/uL   Lymphs Abs 3,184 850 - 3,900 cells/uL   WBC mixed population 509 200 - 950 cells/uL   Eosinophils Absolute 198 15 - 500 cells/uL   Basophils Absolute 53 0 - 200 cells/uL   Neutrophils Relative % 48.1 %   Total Lymphocyte 41.9 %   Monocytes Relative 6.7 %   Eosinophils Relative 2.6 %   Basophils Relative 0.7 %  POCT Glucose (CBG)  Result Value Ref Range   POC Glucose 149 (A) 70 - 99 mg/dl      Assessment & Plan:   Problem List Items Addressed This Visit      Cardiovascular and Mediastinum   Calcification of abdominal aorta (HCC)    Explained dx; statin; goal LDL less than 70      Relevant Medications   losartan (COZAAR) 100 MG tablet   pravastatin (PRAVACHOL) 10 MG tablet     Digestive   Cirrhosis (HCC)    With portal hypertension; notify Dr. Allen Norris; have her follow-up with him as needed        Endocrine   Hypothyroidism   Relevant Medications   levothyroxine (SYNTHROID, LEVOTHROID) 50 MCG tablet   Uncontrolled type 2 diabetes mellitus with peripheral neuropathy (HCC)    Adjust dose of medicines to avoid hypoglycemia      Relevant Medications   Insulin Degludec-Liraglutide (XULTOPHY) 100-3.6 UNIT-MG/ML SOPN   losartan (COZAAR) 100 MG tablet   pravastatin (PRAVACHOL) 10 MG tablet     Musculoskeletal and Integument   Chronic gout involving toe of right foot without tophus   Relevant Medications   allopurinol (ZYLOPRIM) 100 MG tablet     Other   Hx of transient ischemic attack (TIA)    On plavix      Depression    Refer to psychiatrist; see AVS      Relevant Orders   Ambulatory referral to Psychiatry    Anxiety, generalized    Refer to psychiatrist      Relevant Orders   Ambulatory referral to Psychiatry    Other Visit Diagnoses    Difficulty controlling anger    -  Primary   Relevant Orders   Ambulatory referral to Psychiatry       Follow up plan: Return in about 2 weeks (around 09/17/2017) for gout, HTN, thyroid.  An after-visit summary was printed and given to the patient at [place].  Please see the patient instructions which may contain other information and recommendations beyond what is mentioned above in the assessment and plan.  Meds ordered this encounter  Medications  . pantoprazole (PROTONIX) 20 MG tablet    Sig: Take 1 tablet (20 mg total) by mouth daily. (for heartburn, reflux)    Dispense:  90 tablet    Refill:  0    On plavix, stop omeprazole  . Insulin Degludec-Liraglutide (XULTOPHY) 100-3.6 UNIT-MG/ML SOPN    Sig: Inject 40 Units into the skin at  bedtime.    Dispense:  5 pen    Refill:  2    Changing instructions -- please call me if this is not going to last her 30 days -- thank you!  [name] Kitchen allopurinol (ZYLOPRIM) 100 MG tablet    Sig: TAKE 1 TABLET(100 MG) BY MOUTH TWICE DAILY    Dispense:  180 tablet    Refill:  0  . levothyroxine (SYNTHROID, LEVOTHROID) 50 MCG tablet    Sig: TAKE 1 TABLET(50 MCG) BY MOUTH DAILY BEFORE BREAKFAST    Dispense:  90 tablet    Refill:  0  . losartan (COZAAR) 100 MG tablet    Sig: Take 1 tablet (100 mg total) by mouth daily.    Dispense:  90 tablet    Refill:  3    STOP the HCTZ component -- she has gout  . pravastatin (PRAVACHOL) 10 MG tablet    Sig: Take 1 tablet (10 mg total) by mouth at bedtime.    Dispense:  90 tablet    Refill:  0    Orders Placed This Encounter  Procedures  . Ambulatory referral to Psychiatry    Close f/u HTN, gout, thyroid, chol

## 2017-09-03 NOTE — Assessment & Plan Note (Signed)
Refer to psychiatrist; see AVS

## 2017-09-03 NOTE — Patient Instructions (Addendum)
Try the melatonin 3 mg at the exact time of night Move sleep schedule back 30 minutes every 1-2 weeks We'll have you see the psychiatrist soon Here are some resources to help you if you feel you are in a mental health crisis:  [facility] - Call [phone]  for help - Website with more resources: GripTrip.com.pt  [facility] - Call [phone] for help. - Mobile Crisis Program available 24 hours a day, 365 days a year. - Available for anyone of any age in [place] counties.  [facility] - Address: [address], [place] - Telephone: [phone]  - Hours of Operation: Sunday - Saturday - 8:00 a.m. - 8:00 p.m. - Medicaid, Medicare (Government Issued Only), BCBS, and [place] [employer], [place], Psychiatrists on-site to provide medication management, [place], and Peer Support Care.  [facility] - Call [phone] for help. - Mobile Crisis Program available 24 hours a day, 365 days a year. - Available for anyone of any age in [place]  We'll have you see Dr. Allen Norris No tylenol for now anyway Decrease your xultophy to 40 units, and then continue to drop to 35 units if needed Eat three meals a day and snacks in between STOP the other heartburn pill and just use pantoprazole Take the pravastatin at bedtime STOP the losartan/HCTZ and use plain losartan from now on

## 2017-09-03 NOTE — Assessment & Plan Note (Signed)
Refer to psychiatrist 

## 2017-09-07 ENCOUNTER — Ambulatory Visit: Payer: PPO

## 2017-09-07 ENCOUNTER — Telehealth: Payer: Self-pay

## 2017-09-07 ENCOUNTER — Ambulatory Visit: Payer: PPO | Admitting: Family Medicine

## 2017-09-07 DIAGNOSIS — K7469 Other cirrhosis of liver: Secondary | ICD-10-CM

## 2017-09-07 DIAGNOSIS — R748 Abnormal levels of other serum enzymes: Secondary | ICD-10-CM

## 2017-09-07 NOTE — Telephone Encounter (Signed)
Copied from [place]. Topic: Referral - Request >> Sep 07, 2017 11:45 AM Carolyn Stare wrote:  Pt call GI Dr Rosario Jacks and they told her she needed a referral. She said Dr Sanda Klein req that she see a GI

## 2017-09-07 NOTE — Assessment & Plan Note (Signed)
Refer to Dr. Allen Norris

## 2017-09-07 NOTE — Assessment & Plan Note (Signed)
Refer back to Dr. Allen Norris

## 2017-09-07 NOTE — Telephone Encounter (Signed)
Referral entered  

## 2017-09-13 NOTE — Assessment & Plan Note (Signed)
Explained dx; statin; goal LDL less than 70

## 2017-09-13 NOTE — Assessment & Plan Note (Signed)
Adjust dose of medicines to avoid hypoglycemia

## 2017-09-21 ENCOUNTER — Ambulatory Visit (INDEPENDENT_AMBULATORY_CARE_PROVIDER_SITE_OTHER): Payer: PPO | Admitting: Family Medicine

## 2017-09-21 ENCOUNTER — Encounter: Payer: Self-pay | Admitting: Family Medicine

## 2017-09-21 DIAGNOSIS — E1165 Type 2 diabetes mellitus with hyperglycemia: Secondary | ICD-10-CM

## 2017-09-21 DIAGNOSIS — I10 Essential (primary) hypertension: Secondary | ICD-10-CM | POA: Diagnosis not present

## 2017-09-21 DIAGNOSIS — K7469 Other cirrhosis of liver: Secondary | ICD-10-CM

## 2017-09-21 DIAGNOSIS — E038 Other specified hypothyroidism: Secondary | ICD-10-CM | POA: Diagnosis not present

## 2017-09-21 DIAGNOSIS — E1142 Type 2 diabetes mellitus with diabetic polyneuropathy: Secondary | ICD-10-CM | POA: Diagnosis not present

## 2017-09-21 DIAGNOSIS — M1A9XX0 Chronic gout, unspecified, without tophus (tophi): Secondary | ICD-10-CM | POA: Diagnosis not present

## 2017-09-21 DIAGNOSIS — IMO0002 Reserved for concepts with insufficient information to code with codable children: Secondary | ICD-10-CM

## 2017-09-21 MED ORDER — INSULIN DEGLUDEC-LIRAGLUTIDE 100-3.6 UNIT-MG/ML ~~LOC~~ SOPN: 40.0000 [IU] | PEN_INJECTOR | Freq: Every day | SUBCUTANEOUS | 2 refills | Status: DC

## 2017-09-21 NOTE — Progress Notes (Signed)
BP 120/78 (BP Location: Left Arm, Patient Position: Sitting, Cuff Size: Large)   Pulse 88   Temp 98 F (36.7 C) (Oral)   Ht 5' 4"  (1.626 m)   Wt 159 lb 3.2 oz (72.2 kg)   SpO2 99%   BMI 27.33 kg/m    Subjective:    Patient ID: Charlotte Herrera, female    DOB: May 23, 1946, 72 y.o.   MRN: [id number]  HPI: Charlotte Herrera is a 72 y.o. female  Chief Complaint  Patient presents with  . Follow-up    HPI Here for f/u  Gout Patient has gout in bilateral feet; has been on allopurinol in the past but stopped taking it due to personal concern of liver effects. Patient states she cannot recall the last time she had a flare.  States does not eat any seafood and avoids alcohol.   Hypertension Blood pressure well-controlled today. States she does not check it at home. Patient is taking losartan. States was taking HCTZ in the past but was taken off that last visit. Does not eat a lot of salty foods and does not add salt to diet.   Hypothyroidism Taking 67mg of synthroid daily, no missed doses. Endorses hair loss, sts sometimes very cold and then gets very hot, and sometimes has diarrhea and sometimes is constipated- chronic- depends on eating habits.  Lab Results  Component Value Date   TSH 1.04 08/03/2017   Elevated Liver Enzymes/ Cirrhosis  Denies nausea, vomiting; noted changes in bowel movements depending on diet. States has abdominal pain if she eats too much at once. Seeing Dr. WAllen Norrisnext week. 04/06/2017 AST 147; ALT 76; Alk Phos 55. CT abdomen 08/14/17- noted --Mild nodular hepatic contour, probably related to cirrhosis.  --Splenic varices and mild splenomegaly, indicative of portal hypertension.   DM2 with peripheral neuropathy Last visit her insulin was lowered due to hypoglycemia and patient has started eating better- more regularly. Patient has been eating three meals a day and snacking as needed in between. Her fasting blood sugars are around 100. Lowest CBG 97; Highest 148. States she was  out of insulin for 4 days because her insurance wouldn't cover but for 28 days worth of insulin.  Mood is doing some better; has three great-grandchildren's birthdays, busy with getting stuff worked out for their parties; not really sleeping well; does not fall asleep until 3 am, then back awake at 7 or 8 am; may be worrying and reliving her daughter's death; they called her with an appointment to see someone April 9th  [facility] 09/21/2017 09/03/2017 08/03/2017 07/06/2017 01/26/2017  Decreased Interest 0 1 1 3  0  Down, Depressed, Hopeless 0 1 1 3  0  PHQ - 2 Score 0 2 2 6  0  Altered sleeping - 3 1 3  -  Tired, decreased energy - 3 1 3  -  Change in appetite - 3 1 1  -  Feeling bad or failure about yourself  - 0 1 3 -  Trouble concentrating - 1 1 3  -  Moving slowly or fidgety/restless - 0 1 3 -  Suicidal thoughts - 0 0 1 -  PHQ-9 Score - 12 8 23  -  Difficult doing work/chores - Very difficult Not difficult at all Very difficult -    Relevant past medical, surgical, family and social history reviewed Past Medical History:  Diagnosis Date  . Calcification of abdominal aorta (HCC) 09/03/2017   [facility]  . [place] 09/03[place]2017   Chest  [facility] Feb [facility]  . Depression   . Enlarged liver   . Fibromyalgia affecting multiple sites   . GERD (gastroesophageal reflux disease)   . Hyperlipidemia   . Hypertension   . Stroke ([place])   . Thyroid disease    Past Surgical History:  Procedure Laterality Date  . ABDOMINAL HYSTERECTOMY    . CARPAL TUNNEL RELEASE Left   . COLONOSCOPY  2015  . SPINE SURGERY    . VARICOSE VEIN SURGERY Left 1975   Family History  Problem Relation Age of Onset  . Cancer Mother   . Ovarian cancer Mother   . Diabetes Father   . Cancer Father   . Prostate cancer Father   . Heart disease Father   . Diabetes Brother   . Cancer Brother   . Breast cancer Maternal Aunt        40's  . Heart disease Daughter   . Healthy Daughter   . Healthy  Daughter   . Ovarian cancer Maternal Aunt   . Liver cancer Maternal Grandmother   . Heart failure Maternal Grandfather    Social History   Tobacco Use  . Smoking status: Never Smoker  . Smokeless tobacco: Never Used  . Tobacco comment: smoking cessation materials not required  Substance Use Topics  . Alcohol use: No  . Drug use: No    Interim medical history since last visit reviewed. Allergies and medications reviewed  Review of Systems Per HPI unless specifically indicated above     Objective:    BP 120/78 (BP Location: Left Arm, Patient Position: Sitting, Cuff Size: Large)   Pulse 88   Temp 98 F (36.7 C) (Oral)   Ht 5' 4"  (1.626 m)   Wt 159 lb 3.2 oz (72.2 kg)   SpO2 99%   BMI 27.33 kg/m   Wt Readings from Last 3 Encounters:  09/21/17 159 lb 3.2 oz (72.2 kg)  09/03/17 160 lb 1.6 oz (72.6 kg)  08/03/17 160 lb 9.6 oz (72.8 kg)    Physical Exam  Constitutional: She appears well-developed and well-nourished. No distress.  HENT:  Head: Normocephalic and atraumatic.  Eyes: EOM are normal. No scleral icterus.  Neck: No thyromegaly present.  Cardiovascular: Normal rate, regular rhythm and normal heart sounds.  No murmur heard. Pulmonary/Chest: Effort normal and breath sounds normal. No respiratory distress. She has no wheezes.  Abdominal: Soft. Bowel sounds are normal. She exhibits no distension.  Musculoskeletal: Normal range of motion. She exhibits no edema.  Neurological: She is alert. She exhibits normal muscle tone.  Skin: Skin is warm and dry. She is not diaphoretic. No pallor.  Psychiatric: She has a normal mood and affect. Her behavior is normal. Judgment and thought content normal.   Diabetic Foot Form - Detailed   Diabetic Foot Exam - detailed Diabetic Foot exam was performed with the following findings:  Yes 09/21/2017  3:59 PM  Visual Foot Exam completed.:  Yes  Is there swelling or and abnormal foot shape?:  No Is there elevated skin temparature?:   No Normal Range of Motion:  Yes Pulse Foot Exam completed.:  Yes  Right Dorsalis Pedis:  Present Left Dorsalis Pedis:  Present  Sensory Foot Exam Completed.:  Yes Semmes-Weinstein Monofilament Test  L Site 1-Great Toe:  Pos    Comments:  Decreased sensation worse in left foot      Results for orders placed or performed in visit on 08/03/17  Stool Culture  Result Value Ref Range  MICRO NUMBER: [id number]    SPECIMEN QUALITY: ADEQUATE    SOURCE: STOOL    STATUS: FINAL    SHIGA RESULT: Not Detected    MICRO NUMBER: [id number]    SPECIMEN QUALITY: ADEQUATE    Source STOOL    STATUS: FINAL    CAM RESULT: No enteric Campylobacter isolated    MICRO NUMBER: [id number]    SPECIMEN QUALITY: ADEQUATE    SOURCE: STOOL    STATUS: FINAL    SS RESULT: No Salmonella or Shigella isolated   Stool Giardia/Cryptosporidium  Result Value Ref Range   MICRO NUMBER: [id number]    SPECIMEN QUALITY: ADEQUATE    Source: STOOL    STATUS: FINAL    RESULT: Not Detected    COMMENT:      NOTE: Due to intermittent shedding, one negative sample does not necessarily rule out the presence of a parasitic infection.   MICRO NUMBER: [id number]    SPECIMEN QUALITY: ADEQUATE    Source: STOOL    STATUS: FINAL    RESULT: Not Detected    COMMENT:      NOTE: Due to intermittent shedding, one negative sample does not necessarily rule out the presence of a parasitic infection.  TSH  Result Value Ref Range   TSH 1.04 0.40 - 4.50 mIU/L  HgB A1c  Result Value Ref Range   Hgb A1c MFr Bld 7.3 (H) <5.7 % of total Hgb   Mean Plasma Glucose 163 (calc)   eAG (mmol/L) 9.0 (calc)  Lipid panel  Result Value Ref Range   Cholesterol 147 <200 mg/dL   HDL 51 >50 mg/dL   Triglycerides 127 <150 mg/dL   LDL Cholesterol (Calc) 75 mg/dL (calc)   Total CHOL/HDL Ratio 2.9 <5.0 (calc)   Non-HDL Cholesterol (Calc) 96 <130 mg/dL (calc)  Urinalysis, Routine w reflex microscopic  Result Value Ref Range   Color, Urine DARK YELLOW YELLOW     APPearance CLOUDY (A) CLEAR   Specific Gravity, Urine 1.021 1.001 - 1.03   pH < OR = 5.0 5.0 - 8.0   Glucose, UA NEGATIVE NEGATIVE   Bilirubin Urine NEGATIVE NEGATIVE   Ketones, ur TRACE (A) NEGATIVE   Hgb urine dipstick NEGATIVE NEGATIVE   Protein, ur NEGATIVE NEGATIVE   Nitrite POSITIVE (A) NEGATIVE   Leukocytes, UA 3+ (A) NEGATIVE   WBC, UA 20-40 (A) 0 - 5 /HPF   RBC / HPF 0-2 0 - 2 /HPF   Squamous Epithelial / LPF 0-5 < OR = 5 /HPF   Bacteria, UA MANY (A) NONE SEEN /HPF   Hyaline Cast NONE SEEN NONE SEEN /LPF  CBC with Differential/Platelet  Result Value Ref Range   WBC 7.6 3.8 - 10.8 Thousand/uL   RBC 4.04 3.80 - 5.10 Million/uL   Hemoglobin 12.9 11.7 - 15.5 g/dL   HCT 37.5 35.0 - 45.0 %   MCV 92.8 80.0 - 100.0 fL   MCH 31.9 27.0 - 33.0 pg   MCHC 34.4 32.0 - 36.0 g/dL   RDW 12.6 11.0 - 15.0 %   Platelets 158 140 - 400 Thousand/uL   MPV 10.2 7.5 - 12.5 fL   Neutro Abs 3,656 1,500 - 7,800 cells/uL   Lymphs Abs 3,184 850 - 3,900 cells/uL   WBC mixed population 509 200 - 950 cells/uL   Eosinophils Absolute 198 15 - 500 cells/uL   Basophils Absolute 53 0 - 200 cells/uL   Neutrophils Relative % 48.1 %   Total Lymphocyte 41.9 %   Monocytes Relative  6.7 %   Eosinophils Relative 2.6 %   Basophils Relative 0.7 %  POCT Glucose (CBG)  Result Value Ref Range   POC Glucose 149 (A) 70 - 99 mg/dl   Diabetic Foot Form - Detailed   Diabetic Foot Exam - detailed Diabetic Foot exam was performed with the following findings:  Yes 09/21/2017  3:59 PM  Visual Foot Exam completed.:  Yes  Is there swelling or and abnormal foot shape?:  No Is there elevated skin temparature?:  No Normal Range of Motion:  Yes Pulse Foot Exam completed.:  Yes  Right Dorsalis Pedis:  Present Left Dorsalis Pedis:  Present  Sensory Foot Exam Completed.:  Yes Semmes-Weinstein Monofilament Test  L Site 1-Great Toe:  Pos    Comments:  Decreased sensation worse in left foot         Assessment & Plan:    Problem List Items Addressed This Visit      Cardiovascular and Mediastinum   Hypertension    Taking Losartan, well-controlled today. Does not add salt         Digestive   Cirrhosis ([place])    Elevated LFTs in Oct 2018, follow-up with Dr. Allen Norris next week        Endocrine   Uncontrolled type 2 diabetes mellitus with peripheral neuropathy (HCC)    Lowered insulin dose for hypoglycemia prevention- has not had an hypoglycemic event since dose change. Has also been eating meal more regularly. Continue current dose and monitor CBG at home- Please let us know if you have any other hypoglycmic events so we can further adjust our medications.       Relevant Medications   Insulin Degludec-Liraglutide (XULTOPHY) 100-3.6 UNIT-MG/ML SOPN   Hypothyroidism    Last TSH stable- will keep at same dose and re-check TSH follow-up        Musculoskeletal and Integument   Chronic gout involving toe of right foot without tophus    Stopped allopurinol on her own; but has not had flare in a [place] time. Will keep her off it           Follow up plan: Return in about 6 weeks (around 11/01/2017) for follow-up visit with Dr. Sanda Klein, labs too (nonfasting).  An after-visit summary was printed and given to the patient at [place].  Please see the patient instructions which may contain other information and recommendations beyond what is mentioned above in the assessment and plan.  Meds ordered this encounter  Medications  . Insulin Degludec-Liraglutide (XULTOPHY) 100-3.6 UNIT-MG/ML SOPN    Sig: Inject 40 Units into the skin at bedtime.    Dispense:  5 pen    Refill:  2    Changing instructions -- please call me if this is not going to last her 30 days -- thank you!    No orders of the defined types were placed in this encounter.

## 2017-09-21 NOTE — Assessment & Plan Note (Signed)
Stopped allopurinol on her own; but has not had flare in a [place] time. Will keep her off it

## 2017-09-21 NOTE — Assessment & Plan Note (Signed)
Last TSH stable- will keep at same dose and re-check TSH follow-up

## 2017-09-21 NOTE — Assessment & Plan Note (Signed)
Lowered insulin dose for hypoglycemia prevention- has not had an hypoglycemic event since dose change. Has also been eating meal more regularly. Continue current dose and monitor CBG at home- Please let us know if you have any other hypoglycmic events so we can further adjust our medications.

## 2017-09-21 NOTE — Assessment & Plan Note (Signed)
Taking Losartan, well-controlled today. Does not add salt

## 2017-09-21 NOTE — Assessment & Plan Note (Signed)
Elevated LFTs in Oct 2018, follow-up with Dr. Allen Norris next week

## 2017-09-22 ENCOUNTER — Encounter: Payer: Self-pay | Admitting: Family Medicine

## 2017-09-29 ENCOUNTER — Other Ambulatory Visit: Payer: Self-pay

## 2017-09-29 ENCOUNTER — Encounter: Payer: Self-pay | Admitting: Gastroenterology

## 2017-09-29 ENCOUNTER — Telehealth: Payer: Self-pay | Admitting: Family Medicine

## 2017-09-29 ENCOUNTER — Ambulatory Visit: Payer: PPO | Admitting: Gastroenterology

## 2017-09-29 ENCOUNTER — Other Ambulatory Visit
Admission: RE | Admit: 2017-09-29 | Discharge: 2017-09-29 | Disposition: A | Discharge status: Home / Self Care | Payer: PPO | Source: Ambulatory Visit | Attending: Gastroenterology | Admitting: Gastroenterology

## 2017-09-29 VITALS — BP 142/69 | HR 76 | Ht 64.0 in | Wt 157.0 lb

## 2017-09-29 DIAGNOSIS — R748 Abnormal levels of other serum enzymes: Secondary | ICD-10-CM

## 2017-09-29 LAB — HEPATIC FUNCTION PANEL
ALT: 22 U/L (ref 14–54)
AST: 38 U/L (ref 15–41)
Albumin: 3.8 g/dL (ref 3.5–5.0)
Alkaline Phosphatase: 59 U/L (ref 38–126)
Bilirubin, Direct: 0.1 mg/dL (ref 0.1–0.5)
Indirect Bilirubin: 0.4 mg/dL (ref 0.3–0.9)
Total Bilirubin: 0.5 mg/dL (ref 0.3–1.2)
Total Protein: 8.3 g/dL — ABNORMAL HIGH (ref 6.5–8.1)

## 2017-09-29 NOTE — Telephone Encounter (Signed)
Note received from pharmacy benefits manager  #1  I do not prescribe alprazolam for her, so that's a resolved issue #2  She has gotten a prescription for plain losartan from me filled on 09/03/2017; however, she got a prescription for losartan-HCT on 08/22/2017 from another provider (Dr. Kandis Cocking); she should NOT be taking both; please verify #3  She had a prescription filled for omeprazole from Dr. Patrici Ranks on 08/15/2017, then pantoprazole from me on 09/04/2017; I switched her b/c she takes clopidogrel (Plavix); please make sure she is just on pantoprazole now  Thank you

## 2017-09-29 NOTE — Progress Notes (Signed)
Gastroenterology Consultation  Referring Provider:     Arnetha Courser, MD Primary Care Physician:  Arnetha Courser, MD Primary Gastroenterologist:  Dr. Allen Norris     Reason for Consultation:     Weight loss, abnormal liver enzymes,  Diarrhea and abdominal pain        HPI:   Charlotte Herrera is a 72 y.o. y/o female referred for consultation & management of weight loss diarrhea and abdominal pain by Dr. Sanda Klein, Satira Anis, MD.  This patient comes in  with a history of intermittent diarrhea, diffuse abdominal pain and an abnormal liver enzymes.  The patient has also lost approximately 20 pounds since the diarrhea started about a year ago.  The patient comes with her daughter who states that all of this started after the patient found her 46 year old daughter who had died in her sleep.  The patient has had an elevated AST and ALT for many years going back to 2016.  She denies any alcohol abuse.  The patient does report that she takes in dairy products.  The diarrhea is worse some days and other days.  She also has abdominal pain and she reports abdominal pain is intermittent and can happen in the upper abdomen and lower abdomen.  Past Medical History:  Diagnosis Date  . Calcification of abdominal aorta (HCC) 09/03/2017   CT scan [facility] Feb 2019  . Cirrhosis ([place]) 09/03/2017   Chest CT [facility] Feb 2019  . Depression   . Enlarged liver   . Fibromyalgia affecting multiple sites   . GERD (gastroesophageal reflux disease)   . Hyperlipidemia   . Hypertension   . Stroke ([place])   . Thyroid disease     Past Surgical History:  Procedure Laterality Date  . ABDOMINAL HYSTERECTOMY    . CARPAL TUNNEL RELEASE Left   . COLONOSCOPY  2015  . SPINE SURGERY    . VARICOSE VEIN SURGERY Left 1975    Prior to Admission medications   Medication Sig Start Date End Date Taking? Authorizing Provider  b complex vitamins tablet Take 1 tablet by mouth daily.   Yes [provider]  B-D UF III MINI PEN NEEDLES 31G X 5 MM MISC  USE AS DIRECTED AT BEDTIME 08/11/17  Yes Rochel Brome A, MD  Blood Glucose Monitoring Suppl (ONE TOUCH ULTRA MINI) W/DEVICE KIT 1 Device by Does not apply route 2 (two) times daily. 02/12/15  Yes Roselee Nova, MD  cetirizine (ZYRTEC) 10 MG tablet Take 10 mg by mouth daily.   Yes [provider]  clopidogrel (PLAVIX) 75 MG tablet Take 75 mg by mouth daily.   Yes [provider]  glucose blood (ONE TOUCH ULTRA TEST) test strip TEST twice a day 06/09/17  Yes Rochel Brome A, MD  Insulin Degludec-Liraglutide (XULTOPHY) 100-3.6 UNIT-MG/ML SOPN Inject 40 Units into the skin at bedtime. 09/21/17  Yes Lada, Satira Anis, MD  levothyroxine (SYNTHROID, LEVOTHROID) 50 MCG tablet TAKE 1 TABLET(50 MCG) BY MOUTH DAILY BEFORE BREAKFAST 09/03/17  Yes Lada, Satira Anis, MD  losartan (COZAAR) 100 MG tablet Take 1 tablet (100 mg total) by mouth daily. 09/03/17  Yes Lada, Satira Anis, MD  metFORMIN (GLUCOPHAGE) 500 MG tablet Take 1 tablet (500 mg total) by mouth 2 (two) times daily with a meal. 07/06/17 10/04/17 Yes Roselee Nova, MD  [facility] DELICA LANCETS 16X MISC 33 g by Does not apply route 2 (two) times daily. Test twice a day as directed 02/12/15  Yes Roselee Nova, MD  pantoprazole (PROTONIX) 20 MG tablet Take 1 tablet (20 mg total) by mouth daily. (for heartburn, reflux) 09/03/17  Yes Lada, Satira Anis, MD  pravastatin (PRAVACHOL) 10 MG tablet Take 1 tablet (10 mg total) by mouth at bedtime. 09/03/17  Yes Lada, Satira Anis, MD  fluticasone (FLONASE) 50 MCG/ACT nasal spray Place 2 sprays into both nostrils daily. Patient not taking: Reported on 09/29/2017 12/05/15   Roselee Nova, MD  ketoconazole (NIZORAL) 2 % cream Apply 1 application topically daily. Patient not taking: Reported on 09/29/2017 11/03/16   Roselee Nova, MD    Family History  Problem Relation Age of Onset  . Cancer Mother   . Ovarian cancer Mother   . Diabetes Father   . Cancer Father   . Prostate cancer Father   . Heart  disease Father   . Diabetes Brother   . Cancer Brother   . Breast cancer Maternal Aunt        40's  . Heart disease Daughter   . Healthy Daughter   . Healthy Daughter   . Ovarian cancer Maternal Aunt   . Liver cancer Maternal Grandmother   . Heart failure Maternal Grandfather      Social History   Tobacco Use  . Smoking status: Never Smoker  . Smokeless tobacco: Never Used  . Tobacco comment: smoking cessation materials not required  Substance Use Topics  . Alcohol use: No  . Drug use: No    Allergies as of 09/29/2017 - Review Complete 09/29/2017  Allergen Reaction Noted  . Aspirin  12/25/2014  . Duloxetine hcl  12/25/2014  . Penicillins  12/25/2014  . Codeine Nausea And Vomiting and Rash 04/11/2013  . Lyrica [pregabalin] Nausea And Vomiting 04/11/2013  . Sulfa antibiotics Swelling 04/11/2013    Review of Systems:    All systems reviewed and negative except where noted in HPI.   Physical Exam:  BP (!) 142/69   Pulse 76   Ht 5' 4"  (1.626 m)   Wt 157 lb (71.2 kg)   BMI 26.95 kg/m  No LMP recorded. Patient has had a hysterectomy. Psych:  Alert and cooperative. Normal mood and affect. General:   Alert,  Well-developed, well-nourished, pleasant and cooperative in NAD Head:  Normocephalic and atraumatic. Eyes:  Sclera clear, no icterus.   Conjunctiva pink. Ears:  Normal auditory acuity. Nose:  No deformity, discharge, or lesions. Mouth:  No deformity or lesions,oropharynx pink & moist. Neck:  Supple; no masses or thyromegaly. Lungs:  Respirations even and unlabored.  Clear throughout to auscultation.   No wheezes, crackles, or rhonchi. No acute distress. Heart:  Regular rate and rhythm; no murmurs, clicks, rubs, or gallops. Abdomen:  Normal bowel sounds.  No bruits.  Soft, non-tender and non-distended without masses, hepatosplenomegaly or hernias noted.  No guarding or rebound tenderness.  Negative Carnett sign.   Rectal:  Deferred.  Msk:  Symmetrical without gross  deformities.  Good, equal movement & strength bilaterally. Pulses:  Normal pulses noted. Extremities:  No clubbing or edema.  No cyanosis. Neurologic:  Alert and oriented x3;  grossly normal neurologically. Skin:  Intact without significant lesions or rashes.  No jaundice. Lymph Nodes:  No significant cervical adenopathy. Psych:  Alert and cooperative. Normal mood and affect.  Imaging Studies: No results found.  Assessment and Plan:   Anelly Samarin is a 72 y.o. y/o female who comes in with a history of [place]-standing abnormal liver enzymes.  The patient has also been noted to have diarrhea and weight loss over the last year.  This may be related to stress and she lost a 67 year old daughter a year ago when this started.  The patient will have her liver enzymes checked since they have been elevated for some time.  The patient will also be set up for an EGD and colonoscopy due to her weight loss.  The patient had a CT scan of the abdomen at [facility] which showed a nodular liver suggestive of cirrhosis.  The patient has been explained the plan and agrees with it.  Lucilla Lame, MD. Charlotte Herrera   Note: This dictation was prepared with Dragon dictation along with smaller phrase technology. Any transcriptional errors that result from this process are unintentional.

## 2017-09-30 ENCOUNTER — Other Ambulatory Visit: Payer: Self-pay

## 2017-09-30 ENCOUNTER — Telehealth: Payer: Self-pay

## 2017-09-30 DIAGNOSIS — R634 Abnormal weight loss: Secondary | ICD-10-CM

## 2017-09-30 LAB — HEPATITIS A ANTIBODY, TOTAL: Hep A Total Ab: NEGATIVE

## 2017-09-30 LAB — HEPATITIS B SURFACE ANTIGEN: Hepatitis B Surface Ag: NEGATIVE

## 2017-09-30 LAB — ANTI-SMOOTH MUSCLE ANTIBODY, IGG: F-Actin IgG: 11 Units (ref 0–19)

## 2017-09-30 LAB — MITOCHONDRIAL ANTIBODIES: Mitochondrial M2 Ab, IgG: <20 Units (ref 0.0–20.0)

## 2017-09-30 LAB — ALPHA-1-ANTITRYPSIN: A-1 Antitrypsin, Ser: 113 mg/dL (ref 90–200)

## 2017-09-30 LAB — HEPATITIS B SURFACE ANTIBODY,QUALITATIVE: Hep B S Ab: NONREACTIVE

## 2017-09-30 NOTE — Telephone Encounter (Signed)
-----   Message from Lucilla Lame, MD sent at 09/29/2017  1:39 PM EDT ----- Let the patient know that her liver enzymes have come down to completely normal.

## 2017-09-30 NOTE — Telephone Encounter (Signed)
Pt notified of lab results. Advised her there are still a couple more pending and I will contact her when they are back.

## 2017-09-30 NOTE — Telephone Encounter (Signed)
Spoke with pt and she verifies to only be taking the prescriptions given to her by Dr.Lada.

## 2017-10-01 LAB — ANTINUCLEAR ANTIBODIES, IFA: ANA Ab, IFA: NEGATIVE

## 2017-10-05 ENCOUNTER — Ambulatory Visit: Payer: PPO | Admitting: Psychiatry

## 2017-10-08 ENCOUNTER — Other Ambulatory Visit: Payer: Self-pay | Admitting: Family Medicine

## 2017-10-08 ENCOUNTER — Telehealth: Payer: Self-pay | Admitting: Family Medicine

## 2017-10-08 DIAGNOSIS — E1165 Type 2 diabetes mellitus with hyperglycemia: Principal | ICD-10-CM

## 2017-10-08 DIAGNOSIS — IMO0002 Reserved for concepts with insufficient information to code with codable children: Secondary | ICD-10-CM

## 2017-10-08 DIAGNOSIS — E1142 Type 2 diabetes mellitus with diabetic polyneuropathy: Secondary | ICD-10-CM

## 2017-10-08 MED ORDER — METFORMIN HCL 500 MG PO TABS: 500.0000 mg | ORAL_TABLET | Freq: Two times a day (BID) | ORAL | 0 refills | Status: DC

## 2017-10-08 NOTE — Telephone Encounter (Signed)
Last GFR 59; okay for med

## 2017-10-08 NOTE — Telephone Encounter (Signed)
Refill request for metformin but looks like there should still be refills, tried to call pharmacy unable to get through. Will call again. Is this one of those that can't be filled because [facility] [employer] is no longer good here?

## 2017-10-08 NOTE — Telephone Encounter (Signed)
Copied from [place] [id number]. Topic: Quick Communication - Rx Refill/Question >> Oct 08, 2017  9:12 AM Robina Ade, Helene Kelp D wrote: Medication:metFORMIN (GLUCOPHAGE) 500 MG tablet (Expired) Has the patient contacted their pharmacy? Yes (Agent: If no, request that the patient contact the pharmacy for the refill.) Preferred Pharmacy (with phone number or street name): [facility] [phone] - [place] AT [facility] [address] Agent: Please be advised that RX refills may take up to 3 business days. We ask that you follow-up with your pharmacy.

## 2017-10-12 ENCOUNTER — Telehealth: Payer: Self-pay

## 2017-10-12 ENCOUNTER — Telehealth: Payer: Self-pay | Admitting: Family Medicine

## 2017-10-12 DIAGNOSIS — Z23 Encounter for immunization: Secondary | ICD-10-CM

## 2017-10-12 NOTE — Telephone Encounter (Signed)
Pt.notified

## 2017-10-12 NOTE — Telephone Encounter (Signed)
Patient needs vaccination against hep A and hep B viruses; she may schedule with CMA

## 2017-10-12 NOTE — Telephone Encounter (Signed)
-----   Message from [place], [place] sent at 10/12/2017  9:55 AM EDT ----- Good morning!!  Just letting you know this pt, per Dr. Allen Norris is not immune to Hep A or B. She will need those vaccines. I have left her a voicemail letting her know this information.   Have a beautiful day!!  Ginger

## 2017-10-12 NOTE — Telephone Encounter (Signed)
Left vm with results to pt.

## 2017-10-12 NOTE — Assessment & Plan Note (Signed)
Patient will need vaccines for Hep A and Hep B; she may schedule with CMA

## 2017-10-12 NOTE — Telephone Encounter (Signed)
-----   Message from Lucilla Lame, MD sent at 10/04/2017  6:23 PM EDT ----- The patient know that the rest of the labs came back and showed that her hepatitis A and B were negative for immunity and she should be vaccinated.

## 2017-10-12 NOTE — Telephone Encounter (Signed)
Pt notified states will not come in anytime soon due to transportation.  She may wait and discuss at next office visit.

## 2017-10-13 DIAGNOSIS — K219 Gastro-esophageal reflux disease without esophagitis: Secondary | ICD-10-CM | POA: Diagnosis not present

## 2017-10-13 DIAGNOSIS — E7800 Pure hypercholesterolemia, unspecified: Secondary | ICD-10-CM | POA: Diagnosis not present

## 2017-10-13 DIAGNOSIS — R5383 Other fatigue: Secondary | ICD-10-CM | POA: Diagnosis not present

## 2017-10-13 DIAGNOSIS — E538 Deficiency of other specified B group vitamins: Secondary | ICD-10-CM | POA: Diagnosis not present

## 2017-10-13 DIAGNOSIS — E1140 Type 2 diabetes mellitus with diabetic neuropathy, unspecified: Secondary | ICD-10-CM | POA: Diagnosis not present

## 2017-10-13 DIAGNOSIS — I10 Essential (primary) hypertension: Secondary | ICD-10-CM | POA: Diagnosis not present

## 2017-10-28 ENCOUNTER — Telehealth: Payer: Self-pay | Admitting: Gastroenterology

## 2017-10-28 NOTE — Telephone Encounter (Signed)
Pt left vm she states she received a message from [place] to go over instructions for colonoscopy

## 2017-10-29 ENCOUNTER — Encounter: Payer: Self-pay | Admitting: Anesthesiology

## 2017-10-29 NOTE — Telephone Encounter (Signed)
Returned pt's call and she advised me the call was from [facility] to do her colonoscopy preop. Pt did not have any other questions.

## 2017-11-03 NOTE — Discharge Instructions (Signed)
General Anesthesia, Adult, Care After °These instructions provide you with information about caring for yourself after your procedure. Your health care provider may also give you more specific instructions. Your treatment has been planned according to current medical practices, but problems sometimes occur. Call your health care provider if you have any problems or questions after your procedure. °What can I expect after the procedure? °After the procedure, it is common to have: °· Vomiting. °· A sore throat. °· Mental slowness. ° °It is common to feel: °· Nauseous. °· Cold or shivery. °· Sleepy. °· Tired. °· Sore or achy, even in parts of your body where you did not have surgery. ° °Follow these instructions at home: °For at least 24 hours after the procedure: °· Do not: °? Participate in activities where you could fall or become injured. °? Drive. °? Use heavy machinery. °? Drink alcohol. °? Take sleeping pills or medicines that cause drowsiness. °? Make important decisions or sign legal documents. °? Take care of children on your own. °· Rest. °Eating and drinking °· If you vomit, drink water, juice, or soup when you can drink without vomiting. °· Drink enough fluid to keep your urine clear or pale yellow. °· Make sure you have little or no nausea before eating solid foods. °· Follow the diet recommended by your health care provider. °General instructions °· Have a responsible adult stay with you until you are awake and alert. °· Return to your normal activities as told by your health care provider. Ask your health care provider what activities are safe for you. °· Take over-the-counter and prescription medicines only as told by your health care provider. °· If you smoke, do not smoke without supervision. °· Keep all follow-up visits as told by your health care provider. This is important. °Contact a health care provider if: °· You continue to have nausea or vomiting at home, and medicines are not helpful. °· You  cannot drink fluids or start eating again. °· You cannot urinate after 8-12 hours. °· You develop a skin rash. °· You have fever. °· You have increasing redness at the site of your procedure. °Get help right away if: °· You have difficulty breathing. °· You have chest pain. °· You have unexpected bleeding. °· You feel that you are having a life-threatening or urgent problem. °This information is not intended to replace advice given to you by your health care provider. Make sure you discuss any questions you have with your health care provider. °Document Released: 09/21/2000 Document Revised: 11/18/2015 Document Reviewed: 05/30/2015 °Elsevier Interactive Patient Education © 2018 [employer] ° °

## 2017-11-05 ENCOUNTER — Encounter
Admission: RE | Disposition: A | Discharge status: Home / Self Care | Payer: Self-pay | Source: Ambulatory Visit | Attending: Gastroenterology

## 2017-11-05 ENCOUNTER — Ambulatory Visit
Admission: RE | Admit: 2017-11-05 | Discharge: 2017-11-05 | Disposition: A | Discharge status: Home / Self Care | Payer: PPO | Source: Ambulatory Visit | Attending: Gastroenterology | Admitting: Gastroenterology

## 2017-11-05 DIAGNOSIS — Z539 Procedure and treatment not carried out, unspecified reason: Secondary | ICD-10-CM | POA: Diagnosis not present

## 2017-11-05 HISTORY — DX: Nausea with vomiting, unspecified: R11.2

## 2017-11-05 HISTORY — DX: Headache, unspecified: R51.9

## 2017-11-05 HISTORY — DX: Fibromyalgia: M79.7

## 2017-11-05 HISTORY — PX: ESOPHAGOGASTRODUODENOSCOPY (EGD) WITH PROPOFOL: SHX5813

## 2017-11-05 HISTORY — DX: Other specified postprocedural states: Z98.890

## 2017-11-05 HISTORY — DX: Unspecified osteoarthritis, unspecified site: M19.90

## 2017-11-05 HISTORY — PX: COLONOSCOPY WITH PROPOFOL: SHX5780

## 2017-11-05 HISTORY — DX: Transient cerebral ischemic attack, unspecified: G45.9

## 2017-11-05 HISTORY — DX: Hypothyroidism, unspecified: E03.9

## 2017-11-05 HISTORY — DX: Headache: R51

## 2017-11-05 HISTORY — DX: Motion sickness, initial encounter: T75.3XXA

## 2017-11-05 LAB — GLUCOSE, CAPILLARY: Glucose-Capillary: 110 mg/dL — ABNORMAL HIGH (ref 65–99)

## 2017-11-05 SURGERY — COLONOSCOPY WITH PROPOFOL
Anesthesia: Choice

## 2017-11-05 MED ORDER — LACTATED RINGERS IV SOLN: INTRAVENOUS | Status: DC

## 2017-11-05 SURGICAL SUPPLY — 36 items
BALLN DILATOR 10-12 8 (BALLOONS)
BALLN DILATOR 12-15 8 (BALLOONS)
BALLN DILATOR 15-18 8 (BALLOONS)
BALLN DILATOR CRE 0-12 8 (BALLOONS)
BALLN DILATOR ESOPH 8 10 CRE (MISCELLANEOUS) IMPLANT
BALLOON DILATOR 12-15 8 (BALLOONS) IMPLANT
BALLOON DILATOR 15-18 8 (BALLOONS) IMPLANT
BALLOON DILATOR CRE 0-12 8 (BALLOONS) IMPLANT
BLOCK BITE 60FR ADLT L/F GRN (MISCELLANEOUS) ×2 IMPLANT
CANISTER SUCT 1200ML W/VALVE (MISCELLANEOUS) ×2 IMPLANT
CLIP HMST 235XBRD CATH ROT (MISCELLANEOUS) IMPLANT
CLIP RESOLUTION 360 11X235 (MISCELLANEOUS)
ELECT REM PT RETURN 9FT ADLT (ELECTROSURGICAL)
ELECTRODE REM PT RTRN 9FT ADLT (ELECTROSURGICAL) IMPLANT
FCP ESCP3.2XJMB 240X2.8X (MISCELLANEOUS)
FORCEPS BIOP RAD 4 LRG CAP 4 (CUTTING FORCEPS) IMPLANT
FORCEPS BIOP RJ4 240 W/NDL (MISCELLANEOUS)
FORCEPS ESCP3.2XJMB 240X2.8X (MISCELLANEOUS) IMPLANT
GOWN CVR UNV OPN BCK APRN NK (MISCELLANEOUS) ×2 IMPLANT
GOWN ISOL THUMB LOOP REG UNIV (MISCELLANEOUS) ×2
INJECTOR VARIJECT VIN23 (MISCELLANEOUS) IMPLANT
KIT DEFENDO VALVE AND CONN (KITS) IMPLANT
KIT ENDO PROCEDURE OLY (KITS) ×2 IMPLANT
MARKER SPOT ENDO TATTOO 5ML (MISCELLANEOUS) IMPLANT
PROBE APC STR FIRE (PROBE) IMPLANT
RETRIEVER NET PLAT FOOD (MISCELLANEOUS) IMPLANT
RETRIEVER NET ROTH 2.5X230 LF (MISCELLANEOUS) IMPLANT
SNARE SHORT THROW 13M SML OVAL (MISCELLANEOUS) IMPLANT
SNARE SHORT THROW 30M LRG OVAL (MISCELLANEOUS) IMPLANT
SNARE SNG USE RND 15MM (INSTRUMENTS) IMPLANT
SPOT EX ENDOSCOPIC TATTOO (MISCELLANEOUS)
SYR INFLATION 60ML (SYRINGE) IMPLANT
TRAP ETRAP POLY (MISCELLANEOUS) IMPLANT
VARIJECT INJECTOR VIN23 (MISCELLANEOUS)
WATER STERILE IRR 250ML POUR (IV SOLUTION) ×2 IMPLANT
WIRE CRE 18-20MM 8CM F G (MISCELLANEOUS) IMPLANT

## 2017-11-08 ENCOUNTER — Encounter: Payer: Self-pay | Admitting: Gastroenterology

## 2017-11-09 ENCOUNTER — Ambulatory Visit (INDEPENDENT_AMBULATORY_CARE_PROVIDER_SITE_OTHER): Payer: PPO | Admitting: Family Medicine

## 2017-11-09 ENCOUNTER — Encounter: Payer: Self-pay | Admitting: Family Medicine

## 2017-11-09 VITALS — BP 126/68 | HR 98 | Temp 98.3°F | Resp 14 | Ht 64.0 in | Wt 160.4 lb

## 2017-11-09 DIAGNOSIS — E1122 Type 2 diabetes mellitus with diabetic chronic kidney disease: Secondary | ICD-10-CM

## 2017-11-09 DIAGNOSIS — IMO0002 Reserved for concepts with insufficient information to code with codable children: Secondary | ICD-10-CM

## 2017-11-09 DIAGNOSIS — I700 Atherosclerosis of aorta: Secondary | ICD-10-CM | POA: Diagnosis not present

## 2017-11-09 DIAGNOSIS — E038 Other specified hypothyroidism: Secondary | ICD-10-CM

## 2017-11-09 DIAGNOSIS — R413 Other amnesia: Secondary | ICD-10-CM

## 2017-11-09 DIAGNOSIS — E1165 Type 2 diabetes mellitus with hyperglycemia: Secondary | ICD-10-CM

## 2017-11-09 DIAGNOSIS — Z5181 Encounter for therapeutic drug level monitoring: Secondary | ICD-10-CM

## 2017-11-09 DIAGNOSIS — E538 Deficiency of other specified B group vitamins: Secondary | ICD-10-CM | POA: Diagnosis not present

## 2017-11-09 DIAGNOSIS — E782 Mixed hyperlipidemia: Secondary | ICD-10-CM | POA: Diagnosis not present

## 2017-11-09 DIAGNOSIS — I129 Hypertensive chronic kidney disease with stage 1 through stage 4 chronic kidney disease, or unspecified chronic kidney disease: Secondary | ICD-10-CM | POA: Diagnosis not present

## 2017-11-09 DIAGNOSIS — E1142 Type 2 diabetes mellitus with diabetic polyneuropathy: Secondary | ICD-10-CM | POA: Diagnosis not present

## 2017-11-09 DIAGNOSIS — E559 Vitamin D deficiency, unspecified: Secondary | ICD-10-CM | POA: Diagnosis not present

## 2017-11-09 MED ORDER — PRAVASTATIN SODIUM 20 MG PO TABS: 20.0000 mg | ORAL_TABLET | Freq: Every day | ORAL | 3 refills | Status: DC

## 2017-11-09 NOTE — Patient Instructions (Addendum)
Let's increase your cholesterol medicine from 10 mg to 20 mg We'll have you see the neurologist about your memory If you need something for aches or pains, try to use Tylenol (acetaminophen) instead of non-steroidals (which include Aleve, ibuprofen, Advil, Motrin, and naproxen); non-steroidals can cause [place]-term kidney damage STOP cetirizine Let's get labs today If you have not heard anything from my staff in a week about any orders/referrals/studies from today, please contact us here to follow-up [phone] Ask your pharmacist if your lot number of losartan was involved in the recall

## 2017-11-09 NOTE — Assessment & Plan Note (Signed)
Goal LDL less than 70; increase chol medicine and recheck at f/u; limit saturated fats

## 2017-11-09 NOTE — Assessment & Plan Note (Signed)
Goal LDL less than 70; increase statin

## 2017-11-09 NOTE — Progress Notes (Signed)
BP 126/68   Pulse 98   Temp 98.3 F (36.8 C) (Oral)   Resp 14   Ht 5\' 4"  (1.626 m)   Wt 160 lb 6.4 oz (72.8 kg)   SpO2 95%   BMI 27.53 kg/m    Subjective:    Patient ID: Charlotte Herrera, female    DOB: [birth year], 72 y.o.   MRN: [id number]  HPI: Charlotte Herrera is a 72 y.o. female  Chief Complaint  Patient presents with  . Follow-up  . Memory Loss    confusion    HPI Patient is here for f/u; patient was seen on 09/21/17 for multiple issues  She is having confusion and memory loss; she accidentally took a plavix on Friday morning before colonoscopy; ongoing issue Memory testing done today; she is not taking alprazolam; taking cetirizine 6CIT Screen 11/09/2017 07/06/2017  What Year? 0 points 0 points  What month? 0 points 0 points  What time? 0 points 3 points  Count back from 20 0 points 0 points  Months in reverse 0 points 4 points  Repeat phrase 4 points 4 points  Total Score 4 11    Type 2 diabetes; sugars have been better; little bit of numbness in the feet; checking FSBS every morning and sometimes at night; range over the last week; lowest was 127, highest 147 but ate late that night Lab Results  Component Value Date   HGBA1C 6.4 (H) 11/12/2017   Hx of ministrokes; on plavix (but was on hold for a few days prior to planned colonoscopy, which she is going to reschedule)  High cholesterol; just checked in February Lab Results  Component Value Date   CHOL 147 08/03/2017   HDL 51 08/03/2017   LDLCALC 75 08/03/2017   TRIG 127 08/03/2017   CHOLHDL 2.9 08/03/2017   Depression; did not see psychiatrist  Saw Dr. Allen Norris for her liver; reviewed labs; months ago, had elevated SGOT/SGPT; normal SGOT and SGPT one month ago; she has stopped the allopurinol, because she read that could affect her liver enzymes  Hypothyroidism; recently checked, normal Lab Results  Component Value Date   TSH 1.04 08/03/2017    Depression screen [facility] 2/9 11/09/2017 09/21/2017 09/03/2017 08/03/2017  07/06/2017  Decreased Interest 0 0 1 1 3   Down, Depressed, Hopeless 1 0 1 1 3   PHQ - 2 Score 1 0 2 2 6   Altered sleeping - - 3 1 3   Tired, decreased energy - - 3 1 3   Change in appetite - - 3 1 1   Feeling bad or failure about yourself  - - 0 1 3  Trouble concentrating - - 1 1 3   Moving slowly or fidgety/restless - - 0 1 3  Suicidal thoughts - - 0 0 1  PHQ-9 Score - - 12 8 23   Difficult doing work/chores - - Very difficult Not difficult at all Very difficult  Some recent data might be hidden    Relevant past medical, surgical, family and social history reviewed Past Medical History:  Diagnosis Date  . Arthritis    hands, shoulders, knees, feet  . Calcification of abdominal aorta (HCC) 09/03/2017   CT scan [facility] Feb 2019  . Cirrhosis ([place]) 09/03/2017   Chest [facility] Feb 2019  . Depression   . Enlarged liver   . Fibromyalgia   . Fibromyalgia affecting multiple sites    pinched nerve in back of neck  . GERD (gastroesophageal reflux disease)   . Headache   . Hyperlipidemia   .  Hypertension   . Hypothyroidism   . Motion sickness   . PONV (postoperative nausea and vomiting)   . Stroke ([place])   . Thyroid disease   . TIA (transient ischemic attack)    Past Surgical History:  Procedure Laterality Date  . ABDOMINAL HYSTERECTOMY    . CARPAL TUNNEL RELEASE Left   . COLONOSCOPY  2015  . COLONOSCOPY WITH PROPOFOL N/A 11/05/2017   Procedure: COLONOSCOPY WITH PROPOFOL;  Surgeon: Lucilla Lame, MD;  Location: [facility];  Service: Endoscopy;  Laterality: N/A;  Diabetic  . ESOPHAGOGASTRODUODENOSCOPY (EGD) WITH PROPOFOL N/A 11/05/2017   Procedure: ESOPHAGOGASTRODUODENOSCOPY (EGD) WITH PROPOFOL;  Surgeon: Lucilla Lame, MD;  Location: [facility];  Service: Endoscopy;  Laterality: N/A;  . SPINE SURGERY    . VARICOSE VEIN SURGERY Left 1975   Family History  Problem Relation Age of Onset  . Cancer Mother   . Ovarian cancer Mother   . Diabetes Father   . Cancer Father   .  Prostate cancer Father   . Heart disease Father   . Diabetes Brother   . Cancer Brother   . Breast cancer Maternal Aunt        40's  . Heart disease Daughter   . Healthy Daughter   . Healthy Daughter   . Ovarian cancer Maternal Aunt   . Liver cancer Maternal Grandmother   . Heart failure Maternal Grandfather    Social History   Tobacco Use  . Smoking status: Never Smoker  . Smokeless tobacco: Never Used  . Tobacco comment: smoking cessation materials not required  Substance Use Topics  . Alcohol use: No  . Drug use: No    Interim medical history since last visit reviewed. Allergies and medications reviewed  Review of Systems Per HPI unless specifically indicated above     Objective:    BP 126/68   Pulse 98   Temp 98.3 F (36.8 C) (Oral)   Resp 14   Ht 5\' 4"  (1.626 m)   Wt 160 lb 6.4 oz (72.8 kg)   SpO2 95%   BMI 27.53 kg/m   Wt Readings from Last 3 Encounters:  11/09/17 160 lb 6.4 oz (72.8 kg)  11/05/17 156 lb (70.8 kg)  09/29/17 157 lb (71.2 kg)    Physical Exam  Constitutional: She appears well-developed and well-nourished. No distress.  HENT:  Head: Normocephalic and atraumatic.  Eyes: EOM are normal. No scleral icterus.  Neck: No thyromegaly present.  Cardiovascular: Normal rate, regular rhythm and normal heart sounds.  No murmur heard. Pulmonary/Chest: Effort normal and breath sounds normal. No respiratory distress. She has no wheezes.  Abdominal: Soft. Bowel sounds are normal. She exhibits no distension.  Musculoskeletal: Normal range of motion. She exhibits no edema.  Neurological: She is alert. She exhibits normal muscle tone.  Skin: Skin is warm and dry. She is not diaphoretic. No pallor.  Psychiatric: She has a normal mood and affect. Her behavior is normal. Judgment and thought content normal.   Diabetic Foot Form - Detailed   Diabetic Foot Exam - detailed Diabetic Foot exam was performed with the following findings:  Yes 11/09/2017  2:53 PM    Visual Foot Exam completed.:  Yes  Pulse Foot Exam completed.:  Yes  Sensory Foot Exam Completed.:  Yes Semmes-Weinstein Monofilament Test   Comments:  Diminished to monofilament testing     Results for orders placed or performed in visit on 11/09/17  Urine Microalbumin w/creat. ratio  Result Value Ref Range  Creatinine, Urine 86.7 Not Estab. mg/dL   Microalbumin, Urine 29.5 Not Estab. ug/mL   Microalb/Creat Ratio 34.0 (H) 0.0 - 30.0 mg/g creat  Hemoglobin A1C  Result Value Ref Range   Hgb A1c MFr Bld 6.4 (H) 4.8 - 5.6 %   Est. average glucose Bld gHb Est-mCnc 137 mg/dL  Comprehensive Metabolic Panel (CMET)  Result Value Ref Range   Glucose 105 (H) 65 - 99 mg/dL   BUN 20 8 - 27 mg/dL   Creatinine, Ser 1.03 (H) 0.57 - 1.00 mg/dL   GFR calc non Af Amer 54 (L) >59 mL/min/1.73   GFR calc Af Amer 63 >59 mL/min/1.73   BUN/Creatinine Ratio 19 12 - 28   Sodium 143 134 - 144 mmol/L   Potassium 4.3 3.5 - 5.2 mmol/L   Chloride 106 96 - 106 mmol/L   CO2 24 20 - 29 mmol/L   Calcium 10.4 (H) 8.7 - 10.3 mg/dL   Total Protein 7.6 6.0 - 8.5 g/dL   Albumin 4.3 3.5 - 4.8 g/dL   Globulin, Total 3.3 1.5 - 4.5 g/dL   Albumin/Globulin Ratio 1.3 1.2 - 2.2   Bilirubin Total 0.6 0.0 - 1.2 mg/dL   Alkaline Phosphatase 60 39 - 117 IU/L   AST 34 0 - 40 IU/L   ALT 21 0 - 32 IU/L  B12  Result Value Ref Range   Vitamin B-12 406 232 - 1,245 pg/mL      Assessment & Plan:   Problem List Items Addressed This Visit      Cardiovascular and Mediastinum   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)    Check creatinine; avoid NSAIDs; stay well-hydrated; check urine microalb:Cr      Relevant Medications   pravastatin (PRAVACHOL) 20 MG tablet   Other Relevant Orders   Comprehensive Metabolic Panel (CMET) (Completed)   Calcification of abdominal aorta (HCC)    Goal LDL less than 70; increase chol medicine and recheck at f/u; limit saturated fats      Relevant  Medications   pravastatin (PRAVACHOL) 20 MG tablet     Endocrine   Uncontrolled type 2 diabetes mellitus with peripheral neuropathy (HCC)    Check A1c today; foot exam by MD      Relevant Medications   pravastatin (PRAVACHOL) 20 MG tablet   Other Relevant Orders   Basic Metabolic Panel (BMET)   Urine Microalbumin w/creat. ratio (Completed)   Hemoglobin A1C (Completed)   Hypothyroidism    Recently normal TSH; does not need to be rechecked today        Other   Vitamin D deficiency    Last level was normal      Vitamin B12 deficiency    Check level and supplement      Relevant Orders   B12 (Completed)   Hyperlipidemia    Goal LDL less than 70; increase statin      Relevant Medications   pravastatin (PRAVACHOL) 20 MG tablet    Other Visit Diagnoses    Memory loss    -  Primary   Relevant Orders   Ambulatory referral to Neurology   Medication monitoring encounter       Relevant Orders   Comprehensive Metabolic Panel (CMET) (Completed)       Follow up plan: Return in about 1 month (around 12/07/2017) for follow-up visit with Dr. Sanda Klein.  An after-visit summary was printed and given to the patient at [place].  Please see the patient  instructions which may contain other information and recommendations beyond what is mentioned above in the assessment and plan.  Meds ordered this encounter  Medications  . pravastatin (PRAVACHOL) 20 MG tablet    Sig: Take 1 tablet (20 mg total) by mouth at bedtime. For cholesterol    Dispense:  90 tablet    Refill:  3    Increasing the dose    Orders Placed This Encounter  Procedures  . Basic Metabolic Panel (BMET)  . Urine Microalbumin w/creat. ratio  . Hemoglobin A1C  . Comprehensive Metabolic Panel (CMET)  . B12  . Ambulatory referral to Neurology

## 2017-11-09 NOTE — Assessment & Plan Note (Signed)
Check level and supplement 

## 2017-11-09 NOTE — Assessment & Plan Note (Signed)
Recently normal TSH; does not need to be rechecked today

## 2017-11-09 NOTE — Assessment & Plan Note (Addendum)
Last level was normal

## 2017-11-09 NOTE — Assessment & Plan Note (Signed)
Check creatinine; avoid NSAIDs; stay well-hydrated; check urine microalb:Cr

## 2017-11-09 NOTE — Assessment & Plan Note (Signed)
Check A1c today; foot exam by MD 

## 2017-11-11 ENCOUNTER — Telehealth: Payer: Self-pay

## 2017-11-11 NOTE — Telephone Encounter (Signed)
Referral has now been sent to [facility] Neurology.

## 2017-11-11 NOTE — Telephone Encounter (Signed)
Copied from [place] [phone]. Topic: Referral - Question >> Nov 11, 2017 10:50 AM [name], NT wrote: Reason for CRM: Pt would like the Neurology referral sent to another office other than [facility]. She states they will not take her insurance.

## 2017-11-12 DIAGNOSIS — E538 Deficiency of other specified B group vitamins: Secondary | ICD-10-CM | POA: Diagnosis not present

## 2017-11-12 DIAGNOSIS — Z5181 Encounter for therapeutic drug level monitoring: Secondary | ICD-10-CM | POA: Diagnosis not present

## 2017-11-12 DIAGNOSIS — E1142 Type 2 diabetes mellitus with diabetic polyneuropathy: Secondary | ICD-10-CM | POA: Diagnosis not present

## 2017-11-12 DIAGNOSIS — E1122 Type 2 diabetes mellitus with diabetic chronic kidney disease: Secondary | ICD-10-CM | POA: Diagnosis not present

## 2017-11-12 DIAGNOSIS — E1165 Type 2 diabetes mellitus with hyperglycemia: Secondary | ICD-10-CM | POA: Diagnosis not present

## 2017-11-12 DIAGNOSIS — I129 Hypertensive chronic kidney disease with stage 1 through stage 4 chronic kidney disease, or unspecified chronic kidney disease: Secondary | ICD-10-CM | POA: Diagnosis not present

## 2017-11-13 LAB — MICROALBUMIN / CREATININE URINE RATIO
Creatinine, Urine: 86.7 mg/dL
Microalb/Creat Ratio: 34 mg/g creat — ABNORMAL HIGH (ref 0.0–30.0)
Microalbumin, Urine: 29.5 ug/mL

## 2017-11-13 LAB — COMPREHENSIVE METABOLIC PANEL
ALT: 21 IU/L (ref 0–32)
AST: 34 IU/L (ref 0–40)
Albumin/Globulin Ratio: 1.3 (ref 1.2–2.2)
Albumin: 4.3 g/dL (ref 3.5–4.8)
Alkaline Phosphatase: 60 IU/L (ref 39–117)
BUN/Creatinine Ratio: 19 (ref 12–28)
BUN: 20 mg/dL (ref 8–27)
Bilirubin Total: 0.6 mg/dL (ref 0.0–1.2)
CO2: 24 mmol/L (ref 20–29)
Calcium: 10.4 mg/dL — ABNORMAL HIGH (ref 8.7–10.3)
Chloride: 106 mmol/L (ref 96–106)
Creatinine, Ser: 1.03 mg/dL — ABNORMAL HIGH (ref 0.57–1.00)
GFR calc Af Amer: 63 mL/min/{1.73_m2} (ref >59)
GFR calc non Af Amer: 54 mL/min/{1.73_m2} — ABNORMAL LOW (ref >59)
Globulin, Total: 3.3 g/dL (ref 1.5–4.5)
Glucose: 105 mg/dL — ABNORMAL HIGH (ref 65–99)
Potassium: 4.3 mmol/L (ref 3.5–5.2)
Sodium: 143 mmol/L (ref 134–144)
Total Protein: 7.6 g/dL (ref 6.0–8.5)

## 2017-11-13 LAB — HEMOGLOBIN A1C
Est. average glucose Bld gHb Est-mCnc: 137 mg/dL
Hgb A1c MFr Bld: 6.4 % — ABNORMAL HIGH (ref 4.8–5.6)

## 2017-11-13 LAB — VITAMIN B12: Vitamin B-12: 406 pg/mL (ref 232–1245)

## 2017-11-19 ENCOUNTER — Other Ambulatory Visit: Payer: Self-pay

## 2017-11-19 MED ORDER — INSULIN PEN NEEDLE 31G X 5 MM MISC: 1 refills | Status: DC

## 2017-11-30 ENCOUNTER — Telehealth: Payer: Self-pay

## 2017-11-30 DIAGNOSIS — M545 Low back pain: Secondary | ICD-10-CM | POA: Diagnosis not present

## 2017-11-30 DIAGNOSIS — M5136 Other intervertebral disc degeneration, lumbar region: Secondary | ICD-10-CM | POA: Diagnosis not present

## 2017-11-30 NOTE — Telephone Encounter (Signed)
Copied from [place] [phone]. Topic: Referral - Question >> Nov 11, 2017 10:50 AM [name], NT wrote: Reason for CRM: Pt would like the Neurology referral sent to another office other than [facility]. She states they will not take her insurance.   >> Nov 11, 2017  2:36 PM Valla Leaver wrote: Patient would like her neurology referral to be somewhere other than [facility] clinic. She also wants the location to be in [place] first,  [place] 2nd, or [place] 3rd. Please call patient once new referral is placed. >> Nov 30, 2017 11:17 AM [name], NT wrote: Pt would like the neurology referral resent to [facility] Neurology on [address] in [place].  Referral has been sent to [facility] as requested.

## 2017-12-03 ENCOUNTER — Other Ambulatory Visit: Payer: Self-pay | Admitting: Family Medicine

## 2017-12-03 DIAGNOSIS — Z1231 Encounter for screening mammogram for malignant neoplasm of breast: Secondary | ICD-10-CM

## 2017-12-06 ENCOUNTER — Encounter: Payer: Self-pay | Admitting: Neurology

## 2017-12-06 ENCOUNTER — Telehealth: Payer: Self-pay

## 2017-12-06 NOTE — Telephone Encounter (Signed)
Patient is now requesting to move forward with [facility]

## 2017-12-06 NOTE — Telephone Encounter (Signed)
Referral was resent to [facility] Neurology

## 2017-12-06 NOTE — Telephone Encounter (Signed)
Copied from [place] [phone]. Topic: Referral - Question >> Nov 11, 2017 10:50 AM [name], NT wrote: Reason for CRM: Pt would like the Neurology referral sent to another office other than [facility]. She states they will not take her insurance.   >> Nov 11, 2017  2:36 PM Valla Leaver wrote: Patient would like her neurology referral to be somewhere other than [facility] clinic. She also wants the location to be in [place] first,  [place] 2nd, or [place] 3rd. Please call patient once new referral is placed. >> Nov 30, 2017 11:17 AM [name], NT wrote: Pt would like the neurology referral resent to [facility] on [facility] in [place]. >> Dec 06, 2017  2:52 PM [name] wrote: Pt is checking status of referral to [facility]rology. Please advise  Please see the note listed in the referral  Type Date User Summary Attachment  General 11/11/2017 2:58 PM Vassie Moselle - -  Note   Patient is not wanting to come to [facility] thank you for the referral

## 2017-12-06 NOTE — Telephone Encounter (Signed)
Copied from [place] [id number]. Topic: Referral - Question >> Nov 11, 2017 10:50 AM [name], NT wrote: Reason for CRM: Pt would like the Neurology referral sent to another office other than [facility] [place]. She states they will not take her insurance.   >> Nov 11, 2017  2:36 PM Charlotte Herrera wrote: Patient would like her neurology referral to be somewhere other than [facility] clinic. She also wants the location to be in [place] first,  [place] 2nd, or [place] 3rd. Please call patient once new referral is placed. >> Nov 30, 2017 11:17 AM [name], NT wrote: Pt would like the neurology referral resent to [facility] on [facility] in [place]. >> Dec 06, 2017  2:52 PM [name] wrote: Pt is checking status of referral to [facility] Neurology. Please advise

## 2017-12-14 ENCOUNTER — Encounter: Payer: Self-pay | Admitting: Family Medicine

## 2017-12-14 ENCOUNTER — Ambulatory Visit (INDEPENDENT_AMBULATORY_CARE_PROVIDER_SITE_OTHER): Payer: PPO | Admitting: Family Medicine

## 2017-12-14 VITALS — BP 102/58 | HR 87 | Temp 97.4°F | Resp 14 | Ht 64.0 in | Wt 164.1 lb

## 2017-12-14 DIAGNOSIS — E1142 Type 2 diabetes mellitus with diabetic polyneuropathy: Secondary | ICD-10-CM | POA: Diagnosis not present

## 2017-12-14 DIAGNOSIS — K7469 Other cirrhosis of liver: Secondary | ICD-10-CM

## 2017-12-14 DIAGNOSIS — E782 Mixed hyperlipidemia: Secondary | ICD-10-CM

## 2017-12-14 DIAGNOSIS — M1A9XX0 Chronic gout, unspecified, without tophus (tophi): Secondary | ICD-10-CM

## 2017-12-14 DIAGNOSIS — I129 Hypertensive chronic kidney disease with stage 1 through stage 4 chronic kidney disease, or unspecified chronic kidney disease: Secondary | ICD-10-CM

## 2017-12-14 DIAGNOSIS — R809 Proteinuria, unspecified: Secondary | ICD-10-CM

## 2017-12-14 DIAGNOSIS — E038 Other specified hypothyroidism: Secondary | ICD-10-CM | POA: Diagnosis not present

## 2017-12-14 DIAGNOSIS — IMO0002 Reserved for concepts with insufficient information to code with codable children: Secondary | ICD-10-CM

## 2017-12-14 DIAGNOSIS — E559 Vitamin D deficiency, unspecified: Secondary | ICD-10-CM | POA: Diagnosis not present

## 2017-12-14 DIAGNOSIS — E1122 Type 2 diabetes mellitus with diabetic chronic kidney disease: Secondary | ICD-10-CM | POA: Diagnosis not present

## 2017-12-14 DIAGNOSIS — E1165 Type 2 diabetes mellitus with hyperglycemia: Secondary | ICD-10-CM

## 2017-12-14 DIAGNOSIS — B372 Candidiasis of skin and nail: Secondary | ICD-10-CM

## 2017-12-14 DIAGNOSIS — E1151 Type 2 diabetes mellitus with diabetic peripheral angiopathy without gangrene: Secondary | ICD-10-CM

## 2017-12-14 DIAGNOSIS — I700 Atherosclerosis of aorta: Secondary | ICD-10-CM

## 2017-12-14 DIAGNOSIS — R748 Abnormal levels of other serum enzymes: Secondary | ICD-10-CM

## 2017-12-14 MED ORDER — LEVOTHYROXINE SODIUM 50 MCG PO TABS: ORAL_TABLET | ORAL | 0 refills | Status: DC

## 2017-12-14 MED ORDER — KETOCONAZOLE 2 % EX CREA
1.0000 | TOPICAL_CREAM | Freq: Every day | CUTANEOUS | 2 refills | Status: DC | PRN
Start: 2017-12-14 — End: 2018-09-26

## 2017-12-14 MED ORDER — LOSARTAN POTASSIUM 50 MG PO TABS: 50.0000 mg | ORAL_TABLET | Freq: Every day | ORAL | 3 refills | Status: DC

## 2017-12-14 MED ORDER — INSULIN DEGLUDEC-LIRAGLUTIDE 100-3.6 UNIT-MG/ML ~~LOC~~ SOPN: 40.0000 [IU] | PEN_INJECTOR | Freq: Every day | SUBCUTANEOUS | 2 refills | Status: DC

## 2017-12-14 NOTE — Progress Notes (Signed)
BP (!) 102/58   Pulse 87   Temp (!) 97.4 F (36.3 C) (Oral)   Resp 14   Ht 5\' 4"  (1.626 m)   Wt 164 lb 1.6 oz (74.4 kg)   SpO2 96%   BMI 28.17 kg/m    Subjective:    Patient ID: Charlotte Herrera, female    DOB: 1945-08-15, 72 y.o.   MRN: [id number]  HPI: Charlotte Herrera is a 72 y.o. female  Chief Complaint  Patient presents with  . Follow-up  . Medication Refill    HPI Patient is here for follow-up  Type 2 diabetes; she is on Xultophy; checking sugars in the mornings; last 2 weeks, highest was 130, lowest was around 59; trying to watch a good diet; in terms of foot problems, right foot swelling a little; both feet are cool; sees Dr. Lucky Cowboy for vascular issues Last A1c was only 6.4; much much improved, down from 10.4 a year ago  Hypertension in CKD with DM; low pressure today; on 100 mg of losartan daily; trying to sleep better; eating better  Cirrhosis of the liver; seeing Dr. Allen Norris; not sure of etiology; never really drank  Calcification of the abd aorta, noted on scan at [facility]; limiting saturated fats; no meat hardly except for chicken Lab Results  Component Value Date   CHOL 147 08/03/2017   HDL 51 08/03/2017   [place] 75 08/03/2017   TRIG 127 08/03/2017   CHOLHDL 2.9 08/03/2017   Hypothyroidism Lab Results  Component Value Date   TSH 1.04 08/03/2017   Depression screen PHQ 2/9 12/14/2017 11/09/2017 09/21/2017 09/03/2017 08/03/2017  Decreased Interest 0 0 0 1 1  Down, Depressed, Hopeless 1 1 0 1 1  PHQ - 2 Score 1 1 0 2 2  Altered sleeping 0 - - 3 1  Tired, decreased energy 1 - - 3 1  Change in appetite 0 - - 3 1  Feeling bad or failure about yourself  0 - - 0 1  Trouble concentrating 0 - - 1 1  Moving slowly or fidgety/restless 0 - - 0 1  Suicidal thoughts 0 - - 0 0  PHQ-9 Score 2 - - 12 8  Difficult doing work/chores Not difficult at all - - Very difficult Not difficult at all  Some recent data might be hidden    Relevant past medical, surgical, family and social history  reviewed Past Medical History:  Diagnosis Date  . Arthritis    hands, shoulders, knees, feet  . Calcification of abdominal aorta (HCC) 09/03/2017   CT scan [facility] Feb 2019  . Cirrhosis ([place]) 09/03/2017   Chest CT [facility] Feb 2019  . Depression   . Enlarged liver   . Fibromyalgia   . Fibromyalgia affecting multiple sites    pinched nerve in back of neck  . GERD (gastroesophageal reflux disease)   . Headache   . Hyperlipidemia   . Hypertension   . Hypothyroidism   . Motion sickness   . PONV (postoperative nausea and vomiting)   . Stroke ([place])   . Thyroid disease   . TIA (transient ischemic attack)    Past Surgical History:  Procedure Laterality Date  . ABDOMINAL HYSTERECTOMY    . CARPAL TUNNEL RELEASE Left   . COLONOSCOPY  2015  . COLONOSCOPY WITH PROPOFOL N/A 11/05/2017   Procedure: COLONOSCOPY WITH PROPOFOL;  Surgeon: Lucilla Lame, MD;  Location: [facility];  Service: Endoscopy;  Laterality: N/A;  Diabetic  . ESOPHAGOGASTRODUODENOSCOPY (EGD) WITH PROPOFOL  N/A 11/05/2017   Procedure: ESOPHAGOGASTRODUODENOSCOPY (EGD) WITH PROPOFOL;  Surgeon: Lucilla Lame, MD;  Location: [facility];  Service: Endoscopy;  Laterality: N/A;  . SPINE SURGERY    . VARICOSE VEIN SURGERY Left 1975   Family History  Problem Relation Age of Onset  . Cancer Mother   . Ovarian cancer Mother   . Diabetes Father   . Cancer Father   . Prostate cancer Father   . Heart disease Father   . Diabetes Brother   . Cancer Brother   . Breast cancer Maternal Aunt        40's  . Heart disease Daughter   . Healthy Daughter   . Healthy Daughter   . Ovarian cancer Maternal Aunt   . Liver cancer Maternal Grandmother   . Heart failure Maternal Grandfather    Social History   Tobacco Use  . Smoking status: Never Smoker  . Smokeless tobacco: Never Used  . Tobacco comment: smoking cessation materials not required  Substance Use Topics  . Alcohol use: No  . Drug use: No    Interim medical history  since last visit reviewed. Allergies and medications reviewed  Review of Systems Per HPI unless specifically indicated above     Objective:    BP (!) 102/58   Pulse 87   Temp (!) 97.4 F (36.3 C) (Oral)   Resp 14   Ht 5\' 4"  (1.626 m)   Wt 164 lb 1.6 oz (74.4 kg)   SpO2 96%   BMI 28.17 kg/m   Wt Readings from Last 3 Encounters:  12/14/17 164 lb 1.6 oz (74.4 kg)  11/09/17 160 lb 6.4 oz (72.8 kg)  11/05/17 156 lb (70.8 kg)    Physical Exam  Constitutional: She appears well-developed and well-nourished.  Eyes: EOM are normal.  Neck: No thyromegaly present.  Cardiovascular: Normal rate and regular rhythm.  No palpable d pedis or post tibial pulse on either foot; feet are not cold or violaceous  Pulmonary/Chest: Effort normal and breath sounds normal.  Abdominal: Soft. She exhibits no distension.  Musculoskeletal: She exhibits no edema.       Right foot: There is no deformity.       Left foot: There is no deformity.  Feet:  Right Foot:  Protective Sensation: 5 sites tested. 5 sites sensed.  Left Foot:  Protective Sensation: 5 sites tested. 5 sites sensed.  Neurological: She is alert.  Skin: She is not diaphoretic. No pallor.  Psychiatric: She has a normal mood and affect. Her mood appears not anxious. She does not exhibit a depressed mood.   Diabetic Foot Form - Detailed   Diabetic Foot Exam - detailed Diabetic Foot exam was performed with the following findings:  Yes 12/14/2017 12:20 PM  Pulse Foot Exam completed.:  Yes  Right Dorsalis Pedis:  Present Left Dorsalis Pedis:  Present  Sensory Foot Exam Completed.:  Yes Semmes-Weinstein Monofilament Test R Site 1-Great Toe:  Pos L Site 1-Great Toe:  Pos        Results for orders placed or performed in visit on 11/09/17  Urine Microalbumin w/creat. ratio  Result Value Ref Range   Creatinine, Urine 86.7 Not Estab. mg/dL   Microalbumin, Urine 29.5 Not Estab. ug/mL   Microalb/Creat Ratio 34.0 (H) 0.0 - 30.0 mg/g creat    Hemoglobin A1C  Result Value Ref Range   Hgb A1c MFr Bld 6.4 (H) 4.8 - 5.6 %   Est. average glucose Bld gHb Est-mCnc 137 mg/dL  Comprehensive Metabolic Panel (CMET)  Result Value Ref Range   Glucose 105 (H) 65 - 99 mg/dL   BUN 20 8 - 27 mg/dL   Creatinine, Ser 1.03 (H) 0.57 - 1.00 mg/dL   GFR calc non Af Amer 54 (L) >59 mL/min/1.73   GFR calc Af Amer 63 >59 mL/min/1.73   BUN/Creatinine Ratio 19 12 - 28   Sodium 143 134 - 144 mmol/L   Potassium 4.3 3.5 - 5.2 mmol/L   Chloride 106 96 - 106 mmol/L   CO2 24 20 - 29 mmol/L   Calcium 10.4 (H) 8.7 - 10.3 mg/dL   Total Protein 7.6 6.0 - 8.5 g/dL   Albumin 4.3 3.5 - 4.8 g/dL   Globulin, Total 3.3 1.5 - 4.5 g/dL   Albumin/Globulin Ratio 1.3 1.2 - 2.2   Bilirubin Total 0.6 0.0 - 1.2 mg/dL   Alkaline Phosphatase 60 39 - 117 IU/L   AST 34 0 - 40 IU/L   ALT 21 0 - 32 IU/L  B12  Result Value Ref Range   Vitamin B-12 406 232 - 1,245 pg/mL      Assessment & Plan:   Problem List Items Addressed This Visit      Cardiovascular and Mediastinum   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 Allen Norris; no EtOH, no tylenol      Relevant Orders   Comprehensive metabolic panel     Endocrine   Uncontrolled type 2 diabetes mellitus with peripheral neuropathy (HCC) - Primary (Chronic)    Foot exam by MD today      Relevant Medications   Insulin Degludec-Liraglutide (XULTOPHY) 100-3.6 UNIT-MG/ML SOPN   losartan (COZAAR) 50 MG  tablet   Other Relevant Orders   Microalbumin / creatinine urine ratio   Hemoglobin A1c   Hypothyroidism (Chronic)   Relevant Medications   levothyroxine (SYNTHROID, LEVOTHROID) 50 MCG tablet   Other Relevant Orders   TSH     Musculoskeletal and Integument   Chronic gout involving toe of right foot without tophus    Avoiding thiazide      Candidal intertrigo    Refills of ketoconazole given      Relevant Medications   ketoconazole (NIZORAL) 2 % cream     Other   Vitamin D deficiency    Check level      Relevant Orders   VITAMIN D 25 Hydroxy (Vit-D Deficiency, Fractures)   Microalbuminuria    Number 34; watch; continue ARB; limit red meat      Relevant Orders   Microalbumin / creatinine urine ratio   Hyperlipidemia    Goal LDL less than 70; she is so close, reduce saturated fats      Relevant Medications   losartan (COZAAR) 50 MG tablet   Other Relevant Orders   Lipid panel   Abnormal liver enzymes    Check levels in a few months  Relevant Orders   Comprehensive metabolic panel    Other Visit Diagnoses    Type 2 diabetes with decreased circulation ([place])       concern for peripheral vascular disease; will ask Dr. Lucky Cowboy to consider ABI or other testing for evaluation   Relevant Medications   Insulin Degludec-Liraglutide (XULTOPHY) 100-3.6 UNIT-MG/ML SOPN   losartan (COZAAR) 50 MG tablet       Follow up plan: Return in about 2 months (around 02/14/2018) for fasting labs only; return to see Dr. Sanda Klein in 4 months.  An after-visit summary was printed and given to the patient at [place].  Please see the patient instructions which may contain other information and recommendations beyond what is mentioned above in the assessment and plan.  Meds ordered this encounter  Medications  . ketoconazole (NIZORAL) 2 % cream    Sig: Apply 1 application topically daily as needed for irritation. Where needed    Dispense:  60 g    Refill:  2  . levothyroxine  (SYNTHROID, LEVOTHROID) 50 MCG tablet    Sig: TAKE 1 TABLET(50 MCG) BY MOUTH DAILY BEFORE BREAKFAST    Dispense:  90 tablet    Refill:  0  . Insulin Degludec-Liraglutide (XULTOPHY) 100-3.6 UNIT-MG/ML SOPN    Sig: Inject 40 Units into the skin at bedtime.    Dispense:  5 pen    Refill:  2  . losartan (COZAAR) 50 MG tablet    Sig: Take 1 tablet (50 mg total) by mouth daily.    Dispense:  90 tablet    Refill:  3    Lower dose    Orders Placed This Encounter  Procedures  . Microalbumin / creatinine urine ratio  . Lipid panel  . Hemoglobin A1c  . Comprehensive metabolic panel  . VITAMIN D 25 Hydroxy (Vit-D Deficiency, Fractures)  . TSH

## 2017-12-14 NOTE — Assessment & Plan Note (Signed)
Refills of ketoconazole given

## 2017-12-14 NOTE — Assessment & Plan Note (Addendum)
Controlled today and actually low; decrease the losartan; return in 2 weeks to see CMA for BP recheck; DASH guidelines encouraged; avoid NSAIDs; stay hydrated

## 2017-12-14 NOTE — Assessment & Plan Note (Signed)
Number 34; watch; continue ARB; limit red meat

## 2017-12-14 NOTE — Assessment & Plan Note (Signed)
Goal LDL less than 70 

## 2017-12-14 NOTE — Assessment & Plan Note (Signed)
Foot exam by MD today 

## 2017-12-14 NOTE — Assessment & Plan Note (Signed)
Check levels in a few months

## 2017-12-14 NOTE — Assessment & Plan Note (Signed)
Goal LDL less than 70; she is so close, reduce saturated fats

## 2017-12-14 NOTE — Patient Instructions (Addendum)
Decrease the losartan to just 50 mg daily Return to see Roselyn Reef in 2 weeks to check your blood pressure We'll ask Dr. Lucky Cowboy to monitor the circulation in the legs

## 2017-12-14 NOTE — Assessment & Plan Note (Signed)
Seeing Dr. Allen Norris; no EtOH, no tylenol

## 2017-12-14 NOTE — Assessment & Plan Note (Signed)
Check level 

## 2017-12-14 NOTE — Assessment & Plan Note (Signed)
Avoiding thiazide

## 2017-12-21 ENCOUNTER — Telehealth: Payer: Self-pay

## 2017-12-21 DIAGNOSIS — I999 Unspecified disorder of circulatory system: Secondary | ICD-10-CM

## 2017-12-21 NOTE — Telephone Encounter (Signed)
Copied from [place] [id number]. Topic: General - Other >> Dec 21, 2017  1:16 PM Vernona Rieger wrote: Reason for CRM: Patient states she was in the office on 6/18 and Dr Sanda Klein suggested she needed to see Dr Lucky Cowboy, her vein doctor. She wanted to know if Dr Sanda Klein was going to make the appointment or did she need to make it herself? She can be reached at [phone]   Please advise

## 2017-12-21 NOTE — Telephone Encounter (Signed)
I'm happy to refer if desired I put in the order today and she should hear something soon

## 2017-12-21 NOTE — Telephone Encounter (Signed)
Referral has been processed and sent to the requested office.

## 2017-12-28 ENCOUNTER — Telehealth: Payer: Self-pay | Admitting: Family Medicine

## 2017-12-28 NOTE — Telephone Encounter (Signed)
Patient stated that she was going to switch to [facility] to start seeing Threasa Alpha FNP-BC but they do not take her insurance and she could not afford the visits and labs ($1400) out of pocket so she decided to come back to [facility].   Patient was told that Dr. Sanda Klein will be informed of all of this for her decide on how she chooses to proceed.

## 2017-12-28 NOTE — Telephone Encounter (Signed)
See community pharmacy notes about getting meds from multiple providers Please clarify what medicines she is on and who is prescribing them She should only have ONE primary care provider Thank you

## 2017-12-29 NOTE — Telephone Encounter (Signed)
She has controlled substance prescriptions filled in the PMP Aware data base from this other provider in April and May of this year I cannot safely take care of her if she is going to see other providers and get other prescriptions without informing me; this is a dangerous practice I'll advise that she establish with another provider if she did not think we could meet her needs earlier

## 2017-12-29 NOTE — Telephone Encounter (Signed)
LVM informing patient of Dr. Delight Ovens message from 12/29/17 @ 8:02am.  Also stated that if she has any questions to give our office a call back.

## 2018-01-04 ENCOUNTER — Encounter (INDEPENDENT_AMBULATORY_CARE_PROVIDER_SITE_OTHER): Payer: Self-pay | Admitting: Vascular Surgery

## 2018-01-04 ENCOUNTER — Ambulatory Visit
Admission: RE | Admit: 2018-01-04 | Discharge: 2018-01-04 | Disposition: A | Discharge status: Home / Self Care | Payer: PPO | Source: Ambulatory Visit | Attending: Family Medicine | Admitting: Family Medicine

## 2018-01-04 ENCOUNTER — Ambulatory Visit (INDEPENDENT_AMBULATORY_CARE_PROVIDER_SITE_OTHER): Payer: PPO | Admitting: Vascular Surgery

## 2018-01-04 VITALS — BP 129/76 | HR 83 | Resp 12 | Ht 64.0 in | Wt 163.0 lb

## 2018-01-04 DIAGNOSIS — I6529 Occlusion and stenosis of unspecified carotid artery: Secondary | ICD-10-CM | POA: Diagnosis not present

## 2018-01-04 DIAGNOSIS — E1165 Type 2 diabetes mellitus with hyperglycemia: Secondary | ICD-10-CM | POA: Diagnosis not present

## 2018-01-04 DIAGNOSIS — E782 Mixed hyperlipidemia: Secondary | ICD-10-CM | POA: Diagnosis not present

## 2018-01-04 DIAGNOSIS — E1122 Type 2 diabetes mellitus with diabetic chronic kidney disease: Secondary | ICD-10-CM | POA: Diagnosis not present

## 2018-01-04 DIAGNOSIS — E1142 Type 2 diabetes mellitus with diabetic polyneuropathy: Secondary | ICD-10-CM

## 2018-01-04 DIAGNOSIS — M79604 Pain in right leg: Secondary | ICD-10-CM | POA: Diagnosis not present

## 2018-01-04 DIAGNOSIS — M79605 Pain in left leg: Secondary | ICD-10-CM

## 2018-01-04 DIAGNOSIS — Z1231 Encounter for screening mammogram for malignant neoplasm of breast: Secondary | ICD-10-CM | POA: Insufficient documentation

## 2018-01-04 DIAGNOSIS — M79609 Pain in unspecified limb: Secondary | ICD-10-CM | POA: Insufficient documentation

## 2018-01-04 DIAGNOSIS — IMO0002 Reserved for concepts with insufficient information to code with codable children: Secondary | ICD-10-CM

## 2018-01-04 DIAGNOSIS — I129 Hypertensive chronic kidney disease with stage 1 through stage 4 chronic kidney disease, or unspecified chronic kidney disease: Secondary | ICD-10-CM | POA: Diagnosis not present

## 2018-01-04 NOTE — Assessment & Plan Note (Signed)

## 2018-01-04 NOTE — Assessment & Plan Note (Signed)
blood glucose control important in reducing the progression of atherosclerotic disease. Also, involved in wound healing. On appropriate medications.  

## 2018-01-04 NOTE — Assessment & Plan Note (Signed)
lipid control important in reducing the progression of atherosclerotic disease. Continue statin therapy  

## 2018-01-04 NOTE — Progress Notes (Signed)
Patient ID: Charlotte Herrera, female   DOB: Sep 05, 1945, 72 y.o.   MRN: [id number]  Chief Complaint  Patient presents with  . New Patient (Initial Visit)    Poor circulation    HPI Charlotte Herrera is a 72 y.o. female.  I am asked to see the patient by Dr. Sanda Klein for evaluation of poor circulation.  The patient reports pain in her legs.  Her legs are particularly painful and heavy with activity.  She denies any open ulcerations or infection.  She does report swelling in the right leg.  She does not really have much swelling in the left leg. Both legs hurt though. Her symptoms have been steadily progressing over many months.  No clear inciting event or causative factor that started the symptoms.  She denies any fevers or chills.  She has a lot of somatic complaints but does have a history of atherosclerotic disease and is had a carotid endarterectomy about 10 to 15 years ago.  She has not had her carotids checked in over 5 years.  She has not had any recent TIA or stroke symptoms although she does have a remote history of stroke. Specifically, the patient denies amaurosis fugax, speech or swallowing difficulties, or arm or leg weakness or numbness    Past Medical History:  Diagnosis Date  . Arthritis    hands, shoulders, knees, feet  . Calcification of abdominal aorta (HCC) 09/03/2017   CT scan [facility] Feb 2019  . Cirrhosis ([place]) 09/03/2017   Chest CT [facility] Feb 2019  . Depression   . Enlarged liver   . Fibromyalgia   . Fibromyalgia affecting multiple sites    pinched nerve in back of neck  . GERD (gastroesophageal reflux disease)   . Headache   . Hyperlipidemia   . Hypertension   . Hypothyroidism   . Motion sickness   . PONV (postoperative nausea and vomiting)   . Stroke ([place])   . Thyroid disease   . TIA (transient ischemic attack)     Past Surgical History:  Procedure Laterality Date  . ABDOMINAL HYSTERECTOMY    . CARPAL TUNNEL RELEASE Left   . COLONOSCOPY  2015  . COLONOSCOPY WITH PROPOFOL N/A  11/05/2017   Procedure: COLONOSCOPY WITH PROPOFOL;  Surgeon: Lucilla Lame, MD;  Location: [place];  Service: Endoscopy;  Laterality: N/A;  Diabetic  . ESOPHAGOGASTRODUODENOSCOPY (EGD) WITH PROPOFOL N/A 11/05/2017   Procedure: ESOPHAGOGASTRODUODENOSCOPY (EGD) WITH PROPOFOL;  Surgeon: Lucilla Lame, MD;  Location: [place];  Service: Endoscopy;  Laterality: N/A;  . SPINE SURGERY    . VARICOSE VEIN SURGERY Left 1975    Family History  Problem Relation Age of Onset  . Cancer Mother   . Ovarian cancer Mother   . Diabetes Father   . Cancer Father   . Prostate cancer Father   . Heart disease Father   . Diabetes Brother   . Cancer Brother   . Breast cancer Maternal Aunt        40's  . Heart disease Daughter   . Healthy Daughter   . Healthy Daughter   . Ovarian cancer Maternal Aunt   . Liver cancer Maternal Grandmother   . Heart failure Maternal Grandfather      Social History Social History   Tobacco Use  . Smoking status: Never Smoker  . Smokeless tobacco: Never Used  . Tobacco comment: smoking cessation materials not required  Substance Use Topics  . Alcohol use: No  . Drug use:  No     Allergies  Allergen Reactions  . Aspirin   . Duloxetine Hcl   . Penicillins   . Codeine Nausea And Vomiting and Rash  . Lyrica [Pregabalin] Nausea And Vomiting  . Sulfa Antibiotics Swelling    Current Outpatient Medications  Medication Sig Dispense Refill  . b complex vitamins tablet Take 1 tablet by mouth daily.    . clopidogrel (PLAVIX) 75 MG tablet Take 75 mg by mouth daily.    . Insulin Degludec-Liraglutide (XULTOPHY) 100-3.6 UNIT-MG/ML SOPN Inject 40 Units into the skin at bedtime. 5 pen 2  . ketoconazole (NIZORAL) 2 % cream Apply 1 application topically daily as needed for irritation. Where needed 60 g 2  . levothyroxine (SYNTHROID, LEVOTHROID) 50 MCG tablet TAKE 1 TABLET(50 MCG) BY MOUTH DAILY BEFORE BREAKFAST 90 tablet 0  . losartan (COZAAR) 50 MG tablet  Take 1 tablet (50 mg total) by mouth daily. 90 tablet 3  . metFORMIN (GLUCOPHAGE) 500 MG tablet Take 1 tablet (500 mg total) by mouth 2 (two) times daily with a meal. 60 tablet 0  . ONETOUCH DELICA LANCETS 38H MISC 33 g by Does not apply route 2 (two) times daily. Test twice a day as directed 100 each 2  . pantoprazole (PROTONIX) 20 MG tablet Take 1 tablet (20 mg total) by mouth daily. (for heartburn, reflux) 90 tablet 0  . pravastatin (PRAVACHOL) 20 MG tablet Take 1 tablet (20 mg total) by mouth at bedtime. For cholesterol 90 tablet 3  . ALPRAZolam (XANAX) 0.5 MG tablet Take 0.5 mg by mouth at bedtime as needed for anxiety.    . Blood Glucose Monitoring Suppl (ONE TOUCH ULTRA MINI) W/DEVICE KIT 1 Device by Does not apply route 2 (two) times daily. 1 each 0  . fluticasone (FLONASE) 50 MCG/ACT nasal spray Place 2 sprays into both nostrils daily. (Patient not taking: Reported on 09/29/2017) 16 g 2  . glucose blood (ONE TOUCH ULTRA TEST) test strip TEST twice a day 180 each 3  . Insulin Pen Needle (B-D UF III MINI PEN NEEDLES) 31G X 5 MM MISC USE AS DIRECTED AT BEDTIME DX:E11.42 Lon:99 months 100 each 1   No current facility-administered medications for this visit.       REVIEW OF SYSTEMS (Negative unless checked)  Constitutional: _0 Weight loss  _1 Fever  _2 Chills Cardiac: _3 Chest pain   _4 Chest pressure   _5 Palpitations   _6 Shortness of breath when laying flat   _7 Shortness of breath at rest   _8 Shortness of breath with exertion. Vascular:  _9 Pain in legs with walking   _10 Pain in legs at rest   _11 Pain in legs when laying flat   _12 Claudication   _13 Pain in feet when walking  _14 Pain in feet at rest  _15 Pain in feet when laying flat   _16 History of DVT   _17 Phlebitis   _18 Swelling in legs   _19 Varicose veins   _20 Non-healing ulcers Pulmonary:   _21 Uses home oxygen   _22 Productive cough   _23 Hemoptysis   _24 Wheeze  _25 COPD   _26 Asthma Neurologic:  _27 Dizziness  _28 Blackouts   _29 Seizures   _30 History of stroke    _31 History of TIA  _32 Aphasia   _33 Temporary blindness   _34 Dysphagia   _35 Weakness or numbness in arms   _36 Weakness or numbness in legs Musculoskeletal:  _37 Arthritis   _38 Joint swelling   _39 Joint pain   _40 Low back pain Hematologic:  _41 Easy bruising  _42 Easy bleeding   _43 Hypercoagulable state   _44 Anemic  _45 Hepatitis Gastrointestinal:  _46 Blood in stool   _47   Vomiting blood  _0 Gastroesophageal reflux/heartburn   _1 Abdominal pain Genitourinary:  _2 Chronic kidney disease   _3 Difficult urination  _4 Frequent urination  _5 Burning with urination   _6 Hematuria Skin:  _7 Rashes   _8 Ulcers   _9 Wounds Psychological:  _10 History of anxiety   _11  History of major depression.    Physical Exam BP 129/76 (BP Location: Right Arm, Patient Position: Sitting)   Pulse 83   Resp 12   Ht _12  (1.626 m)   Wt 163 lb (73.9 kg)   BMI 27.98 kg/m  Gen:  WD/WN, NAD Head: [place]/AT, No temporalis wasting.  Ear/Nose/Throat: Hearing grossly intact, nares w/o erythema or drainage, oropharynx w/o Erythema/Exudate Eyes: Conjunctiva clear, sclera non-icteric  Neck: trachea midline.  No bruit.  Pulmonary:  Good air movement, clear to auscultation bilaterally.  Cardiac: RRR, no JVD Vascular:  Vessel Right Left  Radial Palpable Palpable                          PT 1+ Palpable 1+ Palpable  DP 2+ Palpable 1+ Palpable   Gastrointestinal: soft, non-tender/non-distended. Musculoskeletal: M/S 5/5 throughout.  Extremities without ischemic changes.  No deformity or atrophy. Mild RLE edema. Neurologic: Sensation grossly intact in extremities.  Symmetrical.  Speech is fluent. Motor exam as listed above. Psychiatric: Judgment intact, Mood & affect appropriate for pt's clinical situation. Dermatologic: No rashes or ulcers noted.  No cellulitis or open wounds.    Radiology Mm 3d Screen Breast Bilateral  Result Date: 01/04/2018 CLINICAL DATA:  Screening. EXAM: DIGITAL SCREENING BILATERAL MAMMOGRAM WITH TOMO AND CAD COMPARISON:   Previous exam(s). ACR Breast Density Category b: There are scattered areas of fibroglandular density. FINDINGS: There are no findings suspicious for malignancy. Images were processed with CAD. IMPRESSION: No mammographic evidence of malignancy. A result letter of this screening mammogram will be mailed directly to the patient. RECOMMENDATION: Screening mammogram in one year. (Code:[id number]-B-01Y) BI-RADS CATEGORY  1: Negative. Electronically Signed   By: Ammie Ferrier M.D.   On: 01/04/2018 13:41    Labs Recent Results (from the past 2160 hour(s))  Glucose, capillary     Status: Abnormal   Collection Time: 11/05/17  7:10 AM  Result Value Ref Range   Glucose-Capillary 110 (H) 65 - 99 mg/dL  Urine Microalbumin w/creat. ratio     Status: Abnormal   Collection Time: 11/12/17  9:47 AM  Result Value Ref Range   Creatinine, Urine 86.7 Not Estab. mg/dL   Microalbumin, Urine 29.5 Not Estab. ug/mL   Microalb/Creat Ratio 34.0 (H) 0.0 - 30.0 mg/g creat    Comment:                      Normal:                0.0 -  30.0                      Albuminuria:          31.0 - 300.0                      Clinical albuminuria:       >300.0   Hemoglobin A1C     Status: Abnormal   Collection Time: 11/12/17  9:47 AM  Result Value Ref Range   Hgb A1c MFr Bld 6.4 (H) 4.8 - 5.6 %    Comment:          Prediabetes:  5.7 - 6.4          Diabetes: >6.4          Glycemic control for adults with diabetes: <7.0    Est. average glucose Bld gHb Est-mCnc 137 mg/dL  Comprehensive Metabolic Panel (CMET)     Status: Abnormal   Collection Time: 11/12/17  9:47 AM  Result Value Ref Range   Glucose 105 (H) 65 - 99 mg/dL   BUN 20 8 - 27 mg/dL   Creatinine, Ser 1.03 (H) 0.57 - 1.00 mg/dL   GFR calc non Af Amer 54 (L) >59 mL/min/1.73   GFR calc Af Amer 63 >59 mL/min/1.73   BUN/Creatinine Ratio 19 12 - 28   Sodium 143 134 - 144 mmol/L   Potassium 4.3 3.5 - 5.2 mmol/L   Chloride 106 96 - 106 mmol/L   CO2 24 20 - 29 mmol/L    Calcium 10.4 (H) 8.7 - 10.3 mg/dL   Total Protein 7.6 6.0 - 8.5 g/dL   Albumin 4.3 3.5 - 4.8 g/dL   Globulin, Total 3.3 1.5 - 4.5 g/dL   Albumin/Globulin Ratio 1.3 1.2 - 2.2   Bilirubin Total 0.6 0.0 - 1.2 mg/dL   Alkaline Phosphatase 60 39 - 117 IU/L   AST 34 0 - 40 IU/L   ALT 21 0 - 32 IU/L  B12     Status: None   Collection Time: 11/12/17  9:47 AM  Result Value Ref Range   Vitamin B-12 406 232 - 1,245 pg/mL    Assessment/Plan:  Hyperlipidemia lipid control important in reducing the progression of atherosclerotic disease. Continue statin therapy  Uncontrolled type 2 diabetes mellitus with peripheral neuropathy (HCC) blood glucose control important in reducing the progression of atherosclerotic disease. Also, involved in wound healing. On appropriate medications.   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 On appropriate oral medications.   Carotid stenosis The patient has a remote history of carotid endarterectomy.  She also has a remote history of stroke.  She has not had her carotids checked in many years.  She is on appropriate medical therapy with Plavix and a statin agent.  We will check her carotids at some point in the future at her convenience.  Pain in limb  Recommend:  The patient has atypical pain symptoms for pure atherosclerotic disease. However, on physical exam there is evidence of mixed venous and arterial disease, given the diminished pulses and the edema associated with venous changes of the legs.  Noninvasive studies including ABI's and venous ultrasound of the legs will be obtained and the patient will follow up with me to review these studies.  The patient should continue walking and begin a more formal exercise program. The patient should continue his antiplatelet therapy and aggressive treatment of the lipid abnormalities.  The  patient should begin wearing graduated compression socks 15-20 mmHg strength to control edema.       Leotis Pain 01/04/2018, 3:10 PM   This note was created with Dragon medical transcription system.  Any errors from dictation are unintentional.

## 2018-01-04 NOTE — Patient Instructions (Signed)

## 2018-01-04 NOTE — Assessment & Plan Note (Signed)
blood pressure control important in reducing the progression of atherosclerotic disease. On appropriate oral medications.  

## 2018-01-04 NOTE — Assessment & Plan Note (Signed)
The patient has a remote history of carotid endarterectomy.  She also has a remote history of stroke.  She has not had her carotids checked in many years.  She is on appropriate medical therapy with Plavix and a statin agent.  We will check her carotids at some point in the future at her convenience.

## 2018-01-10 ENCOUNTER — Telehealth: Payer: Self-pay | Admitting: Gastroenterology

## 2018-01-10 NOTE — Telephone Encounter (Signed)
Patient LVM that she needs to reschedule a previous colonoscopy. Please call

## 2018-01-12 ENCOUNTER — Telehealth: Payer: Self-pay | Admitting: Gastroenterology

## 2018-01-12 NOTE — Telephone Encounter (Signed)
Patient LVM that she needs to reschedule her colonoscopy that she previously canceled. Please call

## 2018-01-13 ENCOUNTER — Other Ambulatory Visit: Payer: Self-pay

## 2018-01-13 ENCOUNTER — Telehealth: Payer: Self-pay

## 2018-01-13 DIAGNOSIS — R634 Abnormal weight loss: Secondary | ICD-10-CM

## 2018-01-13 NOTE — Telephone Encounter (Signed)
Returned patients call LVM to call back to schedule procedures.  Thank you Sharyn Lull

## 2018-01-18 ENCOUNTER — Telehealth (INDEPENDENT_AMBULATORY_CARE_PROVIDER_SITE_OTHER): Payer: Self-pay

## 2018-01-18 NOTE — Telephone Encounter (Signed)
Offered patient 03/16/18 at 830  Patient declined.

## 2018-01-18 NOTE — Telephone Encounter (Signed)
Patient called to see if she could get her Ultrasounds moved up to within the next few couple of weeks because she is now having numbness in her left arm and in each of her calves and legs.  Right now her ultrasounds are scheduled for January 2020. Would you advise that these be moved up to the next few weeks?

## 2018-01-19 NOTE — Telephone Encounter (Signed)
Okay 

## 2018-01-21 ENCOUNTER — Telehealth: Payer: Self-pay | Admitting: Family Medicine

## 2018-01-21 NOTE — Telephone Encounter (Signed)
Copied from [place] [id number]. Topic: Quick Communication - See Telephone Encounter >> Jan 21, 2018  2:32 PM Charlotte Herrera wrote: CRM for notification. See Telephone encounter for: 01/21/18. Pt called in and stated that she would like to know if Dr lada could give her anymore samples of the Insulin Degludec-Liraglutide (XULTOPHY) 100-3.6 UNIT-MG/ML SOPN [[id number].  She stated that she is in the donut hole and can not afford the med.  She will be out today and would like to come by the office by 5 today   Best number  [phone]

## 2018-01-21 NOTE — Telephone Encounter (Signed)
Pt came in was notified we do not have samples.  She was given info for medication management and told her to call back Monday to see if she could get samples then.  She was aslo offered to ask dr. lada about switching med and refused at this time will call back monday

## 2018-01-31 ENCOUNTER — Telehealth: Payer: Self-pay | Admitting: Family Medicine

## 2018-01-31 MED ORDER — "INSULIN SYRINGE 31G X 5/16"" 0.3 ML MISC": 2 refills | Status: DC

## 2018-01-31 MED ORDER — INSULIN NPH (HUMAN) (ISOPHANE) 100 UNIT/ML ~~LOC~~ SUSP: SUBCUTANEOUS | 11 refills | Status: DC

## 2018-01-31 NOTE — Telephone Encounter (Signed)
Copied from [place] [id number]. Topic: Quick Communication - Rx Refill/Question >> Jan 31, 2018  8:29 AM Nils Flack wrote: Medication: Insulin   Has the patient contacted their pharmacy? No. (Agent: If no, request that the patient contact the pharmacy for the refill.) (Agent: If yes, when and what did the pharmacy advise?)  Preferred Pharmacy (with phone number or street name): pt pick this medication up at the office and is asking if you have it in yet.  Please call [phone]   Agent: Please be advised that RX refills may take up to 3 business days. We ask that you follow-up with your pharmacy.

## 2018-01-31 NOTE — Telephone Encounter (Signed)
Lab Results  Component Value Date   HGBA1C 6.4 (H) 11/12/2017   All of the GLP-1s are going to be very expensive The least expensive insulin I can give her is NPH I'll send in a prescription to her pharmacy, and we'll have her start low so we don't bottom out her sugar STOP the Xultophy Start insulin NPH She'll give herself 16 units (sixteen) with breakfast and 8 units (eight) with evening meal Monitor sugars before meals three times a day and at bedtime, and call us on Friday with readings for next adjustment

## 2018-01-31 NOTE — Telephone Encounter (Signed)
Left detailed voicemial 

## 2018-01-31 NOTE — Telephone Encounter (Signed)
Pt can not afford the xultophy she is in the donut hole and cant afford. Her last dose is today and we do not have samples is there anything else you recommend besides victoza?

## 2018-02-01 ENCOUNTER — Other Ambulatory Visit: Payer: Self-pay | Admitting: Family Medicine

## 2018-02-01 ENCOUNTER — Ambulatory Visit (INDEPENDENT_AMBULATORY_CARE_PROVIDER_SITE_OTHER): Payer: PPO | Admitting: Family Medicine

## 2018-02-01 ENCOUNTER — Encounter: Payer: Self-pay | Admitting: Family Medicine

## 2018-02-01 VITALS — BP 132/70 | HR 85 | Temp 98.3°F | Resp 12 | Ht 64.0 in | Wt 160.5 lb

## 2018-02-01 DIAGNOSIS — F331 Major depressive disorder, recurrent, moderate: Secondary | ICD-10-CM

## 2018-02-01 DIAGNOSIS — E1142 Type 2 diabetes mellitus with diabetic polyneuropathy: Secondary | ICD-10-CM

## 2018-02-01 DIAGNOSIS — G4700 Insomnia, unspecified: Secondary | ICD-10-CM | POA: Diagnosis not present

## 2018-02-01 DIAGNOSIS — E1165 Type 2 diabetes mellitus with hyperglycemia: Secondary | ICD-10-CM

## 2018-02-01 DIAGNOSIS — IMO0002 Reserved for concepts with insufficient information to code with codable children: Secondary | ICD-10-CM

## 2018-02-01 MED ORDER — TRAZODONE HCL 50 MG PO TABS
25.0000 mg | ORAL_TABLET | Freq: Every evening | ORAL | 5 refills | Status: DC | PRN
Start: 2018-02-01 — End: 2018-07-07

## 2018-02-01 MED ORDER — INSULIN DEGLUDEC-LIRAGLUTIDE 100-3.6 UNIT-MG/ML ~~LOC~~ SOPN: 40.0000 [IU] | PEN_INJECTOR | Freq: Every day | SUBCUTANEOUS | 5 refills | Status: DC

## 2018-02-01 MED ORDER — INSULIN PEN NEEDLE 31G X 5 MM MISC: 1 refills | Status: DC

## 2018-02-01 MED ORDER — BUPROPION HCL ER (XL) 150 MG PO TB24
150.0000 mg | ORAL_TABLET | Freq: Every day | ORAL | 0 refills | Status: DC
Start: 2018-02-01 — End: 2018-04-19

## 2018-02-01 NOTE — Progress Notes (Signed)
BP 132/70   Pulse 85   Temp 98.3 F (36.8 C) (Oral)   Resp 12   Ht 5\' 4"  (1.626 m)   Wt 160 lb 8 oz (72.8 kg)   SpO2 96%   BMI 27.55 kg/m    Subjective:    Patient ID: Charlotte Herrera, female    DOB: 12/14/1945, 72 y.o.   MRN: [id number]  HPI: Charlotte Herrera is a 72 y.o. female  Chief Complaint  Patient presents with  . Medication Problem    insulin  . Depression    HPI  Patient is here due to worsening depression. States has had some chronic pain that keeps her up at night. Patient was on xanax with Dr. Manuella Ghazi- states helped relax her. States relives her daughter's death- happened last year and this keep her up at night- states he birthday is coming up and it has been difficult; trying to get money together for a headstone; son-in-law moved to [place] with another women.  She lives by herself. Called hospice to try to get an counselor but could not.  Dr. Ernie Hew started her on escitalopram 5mg  in 2004 but it made her dizzy. Consider cymbalta, but is allergic  Chronic pain in bilateral hands and feet- some relief with gabapentin states gets 7 hours of sleep. States when she is up in the day feet and legs are not painful but when she is sitting or laying down it hurts worse. She uses ice. Saw vascular- referred at last appointment. Has been wearing compression stockings, walks at work about 10 minutes a day 5 days a week.; that is keeping her up at night; tried lexapro and got dizzy  Patient is taking xultophy- states is too expensive and she is in the donut hole. She got a coupon card but drug store states she can use her insurance and the card. States is like $200 for a month supply. So she's buying it one week at a time. Sugars have been well controlled- this morning was 95-145. Cut out bacon, pork, and red meats significantly from diet. She never picked up the NPH and has not started it. She does not want to do the vial and drawing up in the needle  Depression screen [facility] 2/9 02/01/2018  12/14/2017 11/09/2017 09/21/2017 09/03/2017  Decreased Interest 0 0 0 0 1  Down, Depressed, Hopeless 3 1 1  0 1  PHQ - 2 Score 3 1 1  0 2  Altered sleeping 3 0 - - 3  Tired, decreased energy 3 1 - - 3  Change in appetite 3 0 - - 3  Feeling bad or failure about yourself  0 0 - - 0  Trouble concentrating 0 0 - - 1  Moving slowly or fidgety/restless 0 0 - - 0  Suicidal thoughts 0 0 - - 0  PHQ-9 Score 12 2 - - 12  Difficult doing work/chores Somewhat difficult Not difficult at all - - Very difficult  Some recent data might be hidden  MD notes: no SI/HI; get help if any thoughts  Relevant past medical, surgical, family and social history reviewed Past Medical History:  Diagnosis Date  . Arthritis    hands, shoulders, knees, feet  . Calcification of abdominal aorta (HCC) 09/03/2017   CT scan [facility] Feb 2019  . Cirrhosis ([place]) 09/03/2017   Chest [facility] Feb 2019  . Depression   . Enlarged liver   . Fibromyalgia   . Fibromyalgia affecting multiple sites    pinched nerve  in back of neck  . GERD (gastroesophageal reflux disease)   . Headache   . Hyperlipidemia   . Hypertension   . Hypothyroidism   . Motion sickness   . PONV (postoperative nausea and vomiting)   . Stroke ([place])   . Thyroid disease   . TIA (transient ischemic attack)    Past Surgical History:  Procedure Laterality Date  . ABDOMINAL HYSTERECTOMY    . CARPAL TUNNEL RELEASE Left   . COLONOSCOPY  2015  . COLONOSCOPY WITH PROPOFOL N/A 11/05/2017   Procedure: COLONOSCOPY WITH PROPOFOL;  Surgeon: Lucilla Lame, MD;  Location: [place];  Service: Endoscopy;  Laterality: N/A;  Diabetic  . ESOPHAGOGASTRODUODENOSCOPY (EGD) WITH PROPOFOL N/A 11/05/2017   Procedure: ESOPHAGOGASTRODUODENOSCOPY (EGD) WITH PROPOFOL;  Surgeon: Lucilla Lame, MD;  Location: [place];  Service: Endoscopy;  Laterality: N/A;  . SPINE SURGERY    . VARICOSE VEIN SURGERY Left 1975   Family History  Problem Relation Age of Onset  . Cancer Mother    . Ovarian cancer Mother   . Diabetes Father   . Cancer Father   . Prostate cancer Father   . Heart disease Father   . Diabetes Brother   . Cancer Brother   . Breast cancer Maternal Aunt        40's  . Heart disease Daughter   . Healthy Daughter   . Healthy Daughter   . Ovarian cancer Maternal Aunt   . Liver cancer Maternal Grandmother   . Heart failure Maternal Grandfather    Social History   Tobacco Use  . Smoking status: Never Smoker  . Smokeless tobacco: Never Used  . Tobacco comment: smoking cessation materials not required  Substance Use Topics  . Alcohol use: No  . Drug use: No    Interim medical history since last visit reviewed. Allergies and medications reviewed  Review of Systems Per HPI unless specifically indicated above     Objective:    BP 132/70   Pulse 85   Temp 98.3 F (36.8 C) (Oral)   Resp 12   Ht 5\' 4"  (1.626 m)   Wt 160 lb 8 oz (72.8 kg)   SpO2 96%   BMI 27.55 kg/m   Wt Readings from Last 3 Encounters:  02/01/18 160 lb 8 oz (72.8 kg)  01/04/18 163 lb (73.9 kg)  12/14/17 164 lb 1.6 oz (74.4 kg)    Physical Exam  Constitutional: She appears well-developed and well-nourished. No distress.  HENT:  Head: Normocephalic and atraumatic.  Eyes: EOM are normal. No scleral icterus.  Neck: No thyromegaly present.  Cardiovascular: Normal rate, regular rhythm and normal heart sounds.  No murmur heard. Pulmonary/Chest: Effort normal and breath sounds normal. No respiratory distress. She has no wheezes.  Abdominal: Soft. Bowel sounds are normal. She exhibits no distension.  Musculoskeletal: She exhibits no edema.  Neurological: She is alert.  Skin: Skin is warm and dry. She is not diaphoretic. No pallor.  Psychiatric: She has a normal mood and affect. Her behavior is normal. Judgment and thought content normal.    Diabetic Foot Form - Detailed   Diabetic Foot Exam - detailed Diabetic Foot exam was performed with the following findings:  Yes  02/01/2018  3:32 PM  Visual Foot Exam completed.:  Yes  Pulse Foot Exam completed.:  Yes  Right Dorsalis Pedis:  Present Left Dorsalis Pedis:  Present  Sensory Foot Exam Completed.:  Yes Semmes-Weinstein Monofilament Test R Site 1-Great Toe:  Neg L  Site 1-Great Toe:  Neg    Comments:  Neuropathy to monofilament testing to just above ankle     Results for orders placed or performed in visit on 11/09/17  Urine Microalbumin w/creat. ratio  Result Value Ref Range   Creatinine, Urine 86.7 Not Estab. mg/dL   Microalbumin, Urine 29.5 Not Estab. ug/mL   Microalb/Creat Ratio 34.0 (H) 0.0 - 30.0 mg/g creat  Hemoglobin A1C  Result Value Ref Range   Hgb A1c MFr Bld 6.4 (H) 4.8 - 5.6 %   Est. average glucose Bld gHb Est-mCnc 137 mg/dL  Comprehensive Metabolic Panel (CMET)  Result Value Ref Range   Glucose 105 (H) 65 - 99 mg/dL   BUN 20 8 - 27 mg/dL   Creatinine, Ser 1.03 (H) 0.57 - 1.00 mg/dL   GFR calc non Af Amer 54 (L) >59 mL/min/1.73   GFR calc Af Amer 63 >59 mL/min/1.73   BUN/Creatinine Ratio 19 12 - 28   Sodium 143 134 - 144 mmol/L   Potassium 4.3 3.5 - 5.2 mmol/L   Chloride 106 96 - 106 mmol/L   CO2 24 20 - 29 mmol/L   Calcium 10.4 (H) 8.7 - 10.3 mg/dL   Total Protein 7.6 6.0 - 8.5 g/dL   Albumin 4.3 3.5 - 4.8 g/dL   Globulin, Total 3.3 1.5 - 4.5 g/dL   Albumin/Globulin Ratio 1.3 1.2 - 2.2   Bilirubin Total 0.6 0.0 - 1.2 mg/dL   Alkaline Phosphatase 60 39 - 117 IU/L   AST 34 0 - 40 IU/L   ALT 21 0 - 32 IU/L  B12  Result Value Ref Range   Vitamin B-12 406 232 - 1,245 pg/mL      Assessment & Plan:   Problem List Items Addressed This Visit      Endocrine   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)    Discussed options, NPH versus keeping her in samples; she opted to go back to the Xultophy; this shouls also help her lose weight      Relevant Medications   Insulin Degludec-Liraglutide (XULTOPHY) 100-3.6 UNIT-MG/ML SOPN   traZODone  (DESYREL) 50 MG tablet   buPROPion (WELLBUTRIN XL) 150 MG 24 hr tablet   Other Relevant Orders   Ambulatory referral to Ophthalmology     Other   Depression - Primary    Add wellbutrin; close f/u; seek help if worsening      Relevant Medications   traZODone (DESYREL) 50 MG tablet   buPROPion (WELLBUTRIN XL) 150 MG 24 hr tablet    Other Visit Diagnoses    Insomnia, unspecified type       start trazodone       Follow up plan: Return in about 3 weeks (around 02/22/2018) for follow-up on mood with Suezanne Cheshire, DNP.  An after-visit summary was printed and given to the patient at [facility].  Please see the patient instructions which may contain other information and recommendations beyond what is mentioned above in the assessment and plan.  Meds ordered this encounter  Medications  . Insulin Degludec-Liraglutide (XULTOPHY) 100-3.6 UNIT-MG/ML SOPN    Sig: Inject 40 Units into the skin daily.    Dispense:  5 pen    Refill:  5  . Insulin Pen Needle (B-D UF III MINI PEN NEEDLES) 31G X 5 MM MISC    Sig: USE AS DIRECTED AT BEDTIME DX:E11.42 Lon:99 months    Dispense:  100 each    Refill:  1  .  traZODone (DESYREL) 50 MG tablet    Sig: Take 0.5-1 tablets (25-50 mg total) by mouth at bedtime as needed for sleep.    Dispense:  30 tablet    Refill:  5  . buPROPion (WELLBUTRIN XL) 150 MG 24 hr tablet    Sig: Take 1 tablet (150 mg total) by mouth daily.    Dispense:  30 tablet    Refill:  0    Orders Placed This Encounter  Procedures  . Ambulatory referral to Ophthalmology

## 2018-02-01 NOTE — Patient Instructions (Addendum)
Start the trazodone if needed for sleep Start the wellbutrin (bupropion) for mood Go back to the Xultophy Close follow-up in 3 weeks with Benjamine Mola, but call us sooner if needed  Insomnia Insomnia is a sleep disorder that makes it difficult to fall asleep or to stay asleep. Insomnia can cause tiredness (fatigue), low energy, difficulty concentrating, mood swings, and poor performance at work or school. There are three different ways to classify insomnia:  Difficulty falling asleep.  Difficulty staying asleep.  Waking up too early in the morning.  Any type of insomnia can be Goethe-term (chronic) or short-term (acute). Both are common. Short-term insomnia usually lasts for three months or less. Chronic insomnia occurs at least three times a week for longer than three months. What are the causes? Insomnia may be caused by another condition, situation, or substance, such as:  Anxiety.  Certain medicines.  Gastroesophageal reflux disease (GERD) or other gastrointestinal conditions.  Asthma or other breathing conditions.  Restless legs syndrome, sleep apnea, or other sleep disorders.  Chronic pain.  Menopause. This may include hot flashes.  Stroke.  Abuse of alcohol, tobacco, or illegal drugs.  Depression.  Caffeine.  Neurological disorders, such as Alzheimer disease.  An overactive thyroid (hyperthyroidism).  The cause of insomnia may not be known. What increases the risk? Risk factors for insomnia include:  Gender. Women are more commonly affected than men.  Age. Insomnia is more common as you get older.  Stress. This may involve your professional or personal life.  Income. Insomnia is more common in people with lower income.  Lack of exercise.  Irregular work schedule or night shifts.  Traveling between different time zones.  What are the signs or symptoms? If you have insomnia, trouble falling asleep or trouble staying asleep is the main symptom. This may  lead to other symptoms, such as:  Feeling fatigued.  Feeling nervous about going to sleep.  Not feeling rested in the morning.  Having trouble concentrating.  Feeling irritable, anxious, or depressed.  How is this treated? Treatment for insomnia depends on the cause. If your insomnia is caused by an underlying condition, treatment will focus on addressing the condition. Treatment may also include:  Medicines to help you sleep.  Counseling or therapy.  Lifestyle adjustments.  Follow these instructions at home:  Take medicines only as directed by your health care provider.  Keep regular sleeping and waking hours. Avoid naps.  Keep a sleep diary to help you and your health care provider figure out what could be causing your insomnia. Include: ? When you sleep. ? When you wake up during the night. ? How well you sleep. ? How rested you feel the next day. ? Any side effects of medicines you are taking. ? What you eat and drink.  Make your bedroom a comfortable place where it is easy to fall asleep: ? Put up shades or special blackout curtains to block light from outside. ? Use a white noise machine to block noise. ? Keep the temperature cool.  Exercise regularly as directed by your health care provider. Avoid exercising right before bedtime.  Use relaxation techniques to manage stress. Ask your health care provider to suggest some techniques that may work well for you. These may include: ? Breathing exercises. ? Routines to release muscle tension. ? Visualizing peaceful scenes.  Cut back on alcohol, caffeinated beverages, and cigarettes, especially close to bedtime. These can disrupt your sleep.  Do not overeat or eat spicy foods right before bedtime.  This can lead to digestive discomfort that can make it hard for you to sleep.  Limit screen use before bedtime. This includes: ? Watching TV. ? Using your smartphone, tablet, and computer.  Stick to a routine. This can  help you fall asleep faster. Try to do a quiet activity, brush your teeth, and go to bed at the same time each night.  Get out of bed if you are still awake after 15 minutes of trying to sleep. Keep the lights down, but try reading or doing a quiet activity. When you feel sleepy, go back to bed.  Make sure that you drive carefully. Avoid driving if you feel very sleepy.  Keep all follow-up appointments as directed by your health care provider. This is important. Contact a health care provider if:  You are tired throughout the day or have trouble in your daily routine due to sleepiness.  You continue to have sleep problems or your sleep problems get worse. Get help right away if:  You have serious thoughts about hurting yourself or someone else. This information is not intended to replace advice given to you by your health care provider. Make sure you discuss any questions you have with your health care provider. Document Released: 06/12/2000 Document Revised: 11/15/2015 Document Reviewed: 03/16/2014 Elsevier Interactive Patient Education  [employer].

## 2018-02-04 ENCOUNTER — Telehealth: Payer: Self-pay | Admitting: Family Medicine

## 2018-02-04 NOTE — Telephone Encounter (Signed)
Copied from [place] [phone]. Topic: General - Other >> Feb 04, 2018 10:34 AM Gardiner Ramus wrote: Reason for CRM: pt called to see if practice had samples of xzultophy. Please advise

## 2018-02-08 NOTE — Assessment & Plan Note (Addendum)
Discussed options, NPH versus keeping her in samples; she opted to go back to the Xultophy; this shouls also help her lose weight

## 2018-02-08 NOTE — Assessment & Plan Note (Signed)
Add wellbutrin; close f/u; seek help if worsening

## 2018-02-09 NOTE — Telephone Encounter (Signed)
Patient was informed on Friday, 02/04/18 that we did have samples of xultophy available.

## 2018-02-14 ENCOUNTER — Ambulatory Visit: Payer: PPO | Admitting: Neurology

## 2018-02-15 ENCOUNTER — Telehealth: Payer: Self-pay

## 2018-02-15 NOTE — Telephone Encounter (Signed)
Blood Thinner Request received from Dr. Delight Ovens office on 02/10/18.  Patient has been informed to stop Plavix 5 days prior to procedure and restart one day after procedure or per gastroenterologists advice if bleeding.  Thanks [employer]

## 2018-02-21 ENCOUNTER — Other Ambulatory Visit: Payer: Self-pay

## 2018-02-21 ENCOUNTER — Encounter: Payer: Self-pay | Admitting: *Deleted

## 2018-02-22 ENCOUNTER — Ambulatory Visit: Payer: Self-pay | Admitting: Nurse Practitioner

## 2018-02-24 NOTE — Discharge Instructions (Signed)
General Anesthesia, Adult, Care After °These instructions provide you with information about caring for yourself after your procedure. Your health care provider may also give you more specific instructions. Your treatment has been planned according to current medical practices, but problems sometimes occur. Call your health care provider if you have any problems or questions after your procedure. °What can I expect after the procedure? °After the procedure, it is common to have: °· Vomiting. °· A sore throat. °· Mental slowness. ° °It is common to feel: °· Nauseous. °· Cold or shivery. °· Sleepy. °· Tired. °· Sore or achy, even in parts of your body where you did not have surgery. ° °Follow these instructions at home: °For at least 24 hours after the procedure: °· Do not: °? Participate in activities where you could fall or become injured. °? Drive. °? Use heavy machinery. °? Drink alcohol. °? Take sleeping pills or medicines that cause drowsiness. °? Make important decisions or sign legal documents. °? Take care of children on your own. °· Rest. °Eating and drinking °· If you vomit, drink water, juice, or soup when you can drink without vomiting. °· Drink enough fluid to keep your urine clear or pale yellow. °· Make sure you have little or no nausea before eating solid foods. °· Follow the diet recommended by your health care provider. °General instructions °· Have a responsible adult stay with you until you are awake and alert. °· Return to your normal activities as told by your health care provider. Ask your health care provider what activities are safe for you. °· Take over-the-counter and prescription medicines only as told by your health care provider. °· If you smoke, do not smoke without supervision. °· Keep all follow-up visits as told by your health care provider. This is important. °Contact a health care provider if: °· You continue to have nausea or vomiting at home, and medicines are not helpful. °· You  cannot drink fluids or start eating again. °· You cannot urinate after 8-12 hours. °· You develop a skin rash. °· You have fever. °· You have increasing redness at the site of your procedure. °Get help right away if: °· You have difficulty breathing. °· You have chest pain. °· You have unexpected bleeding. °· You feel that you are having a life-threatening or urgent problem. °This information is not intended to replace advice given to you by your health care provider. Make sure you discuss any questions you have with your health care provider. °Document Released: 09/21/2000 Document Revised: 11/18/2015 Document Reviewed: 05/30/2015 °Elsevier Interactive Patient Education © 2018 [employer] ° °

## 2018-02-25 ENCOUNTER — Ambulatory Visit: Payer: PPO | Admitting: Anesthesiology

## 2018-02-25 ENCOUNTER — Encounter
Admission: RE | Disposition: A | Discharge status: Home / Self Care | Payer: Self-pay | Source: Ambulatory Visit | Attending: Gastroenterology

## 2018-02-25 ENCOUNTER — Ambulatory Visit
Admission: RE | Admit: 2018-02-25 | Discharge: 2018-02-25 | Disposition: A | Discharge status: Home / Self Care | Payer: PPO | Source: Ambulatory Visit | Attending: Gastroenterology | Admitting: Gastroenterology

## 2018-02-25 DIAGNOSIS — R634 Abnormal weight loss: Secondary | ICD-10-CM | POA: Diagnosis not present

## 2018-02-25 DIAGNOSIS — K219 Gastro-esophageal reflux disease without esophagitis: Secondary | ICD-10-CM | POA: Insufficient documentation

## 2018-02-25 DIAGNOSIS — M797 Fibromyalgia: Secondary | ICD-10-CM | POA: Diagnosis not present

## 2018-02-25 DIAGNOSIS — F329 Major depressive disorder, single episode, unspecified: Secondary | ICD-10-CM | POA: Diagnosis not present

## 2018-02-25 DIAGNOSIS — E039 Hypothyroidism, unspecified: Secondary | ICD-10-CM | POA: Insufficient documentation

## 2018-02-25 DIAGNOSIS — E785 Hyperlipidemia, unspecified: Secondary | ICD-10-CM | POA: Insufficient documentation

## 2018-02-25 DIAGNOSIS — I10 Essential (primary) hypertension: Secondary | ICD-10-CM | POA: Insufficient documentation

## 2018-02-25 DIAGNOSIS — Z7982 Long term (current) use of aspirin: Secondary | ICD-10-CM | POA: Diagnosis not present

## 2018-02-25 DIAGNOSIS — K529 Noninfective gastroenteritis and colitis, unspecified: Secondary | ICD-10-CM | POA: Diagnosis not present

## 2018-02-25 DIAGNOSIS — Z79899 Other long term (current) drug therapy: Secondary | ICD-10-CM | POA: Diagnosis not present

## 2018-02-25 DIAGNOSIS — K5289 Other specified noninfective gastroenteritis and colitis: Secondary | ICD-10-CM | POA: Diagnosis not present

## 2018-02-25 DIAGNOSIS — R197 Diarrhea, unspecified: Secondary | ICD-10-CM

## 2018-02-25 DIAGNOSIS — K2970 Gastritis, unspecified, without bleeding: Secondary | ICD-10-CM

## 2018-02-25 DIAGNOSIS — Z8673 Personal history of transient ischemic attack (TIA), and cerebral infarction without residual deficits: Secondary | ICD-10-CM | POA: Insufficient documentation

## 2018-02-25 DIAGNOSIS — K7460 Unspecified cirrhosis of liver: Secondary | ICD-10-CM | POA: Insufficient documentation

## 2018-02-25 DIAGNOSIS — E119 Type 2 diabetes mellitus without complications: Secondary | ICD-10-CM | POA: Diagnosis not present

## 2018-02-25 DIAGNOSIS — Z7984 Long term (current) use of oral hypoglycemic drugs: Secondary | ICD-10-CM | POA: Insufficient documentation

## 2018-02-25 DIAGNOSIS — K319 Disease of stomach and duodenum, unspecified: Secondary | ICD-10-CM | POA: Diagnosis not present

## 2018-02-25 HISTORY — PX: ESOPHAGOGASTRODUODENOSCOPY (EGD) WITH PROPOFOL: SHX5813

## 2018-02-25 HISTORY — PX: COLONOSCOPY WITH PROPOFOL: SHX5780

## 2018-02-25 HISTORY — DX: Family history of other specified conditions: Z84.89

## 2018-02-25 HISTORY — DX: Type 2 diabetes mellitus without complications: E11.9

## 2018-02-25 HISTORY — DX: Dizziness and giddiness: R42

## 2018-02-25 LAB — GLUCOSE, CAPILLARY
Glucose-Capillary: 98 mg/dL (ref 70–99)
Glucose-Capillary: 79 mg/dL (ref 70–99)

## 2018-02-25 SURGERY — COLONOSCOPY WITH PROPOFOL
Anesthesia: General | Wound class: Clean Contaminated

## 2018-02-25 MED ORDER — LACTATED RINGERS IV SOLN
INTRAVENOUS | Status: DC
  Administered 2018-02-25: 07:00:00 via INTRAVENOUS

## 2018-02-25 MED ORDER — STERILE WATER FOR IRRIGATION IR SOLN
Status: DC | PRN
  Administered 2018-02-25: 08:00:00

## 2018-02-25 MED ORDER — LIDOCAINE HCL (CARDIAC) PF 100 MG/5ML IV SOSY
PREFILLED_SYRINGE | INTRAVENOUS | Status: DC | PRN
  Administered 2018-02-25: 40 mg via INTRAVENOUS

## 2018-02-25 MED ORDER — GLYCOPYRROLATE 0.2 MG/ML IJ SOLN
INTRAMUSCULAR | Status: DC | PRN
  Administered 2018-02-25: 0.2 mg via INTRAVENOUS

## 2018-02-25 MED ORDER — DEXMEDETOMIDINE HCL 200 MCG/2ML IV SOLN
INTRAVENOUS | Status: DC | PRN
  Administered 2018-02-25: 6 ug via INTRAVENOUS

## 2018-02-25 MED ORDER — PROPOFOL 10 MG/ML IV BOLUS
INTRAVENOUS | Status: DC | PRN
  Administered 2018-02-25 (×2): 20 mg via INTRAVENOUS
  Administered 2018-02-25: 30 mg via INTRAVENOUS
  Administered 2018-02-25 (×2): 20 mg via INTRAVENOUS
  Administered 2018-02-25: 100 mg via INTRAVENOUS
  Administered 2018-02-25: 10 mg via INTRAVENOUS
  Administered 2018-02-25 (×4): 30 mg via INTRAVENOUS

## 2018-02-25 MED ORDER — SODIUM CHLORIDE 0.9 % IV SOLN: INTRAVENOUS | Status: DC

## 2018-02-25 SURGICAL SUPPLY — 36 items
BALLN DILATOR 10-12 8 (BALLOONS)
BALLN DILATOR 12-15 8 (BALLOONS)
BALLN DILATOR 15-18 8 (BALLOONS)
BALLN DILATOR CRE 0-12 8 (BALLOONS)
BALLN DILATOR ESOPH 8 10 CRE (MISCELLANEOUS) IMPLANT
BALLOON DILATOR 12-15 8 (BALLOONS) IMPLANT
BALLOON DILATOR 15-18 8 (BALLOONS) IMPLANT
BALLOON DILATOR CRE 0-12 8 (BALLOONS) IMPLANT
BLOCK BITE 60FR ADLT L/F GRN (MISCELLANEOUS) ×2 IMPLANT
CANISTER SUCT 1200ML W/VALVE (MISCELLANEOUS) ×2 IMPLANT
CLIP HMST 235XBRD CATH ROT (MISCELLANEOUS) IMPLANT
CLIP RESOLUTION 360 11X235 (MISCELLANEOUS)
ELECT REM PT RETURN 9FT ADLT (ELECTROSURGICAL)
ELECTRODE REM PT RTRN 9FT ADLT (ELECTROSURGICAL) IMPLANT
FCP ESCP3.2XJMB 240X2.8X (MISCELLANEOUS) ×1
FORCEPS BIOP RAD 4 LRG CAP 4 (CUTTING FORCEPS) IMPLANT
FORCEPS BIOP RJ4 240 W/NDL (MISCELLANEOUS) ×1
FORCEPS ESCP3.2XJMB 240X2.8X (MISCELLANEOUS) ×1 IMPLANT
GOWN CVR UNV OPN BCK APRN NK (MISCELLANEOUS) ×2 IMPLANT
GOWN ISOL THUMB LOOP REG UNIV (MISCELLANEOUS) ×2
INJECTOR VARIJECT VIN23 (MISCELLANEOUS) IMPLANT
KIT DEFENDO VALVE AND CONN (KITS) IMPLANT
KIT ENDO PROCEDURE OLY (KITS) ×2 IMPLANT
MARKER SPOT ENDO TATTOO 5ML (MISCELLANEOUS) IMPLANT
PROBE APC STR FIRE (PROBE) IMPLANT
RETRIEVER NET PLAT FOOD (MISCELLANEOUS) IMPLANT
RETRIEVER NET ROTH 2.5X230 LF (MISCELLANEOUS) IMPLANT
SNARE SHORT THROW 13M SML OVAL (MISCELLANEOUS) IMPLANT
SNARE SHORT THROW 30M LRG OVAL (MISCELLANEOUS) IMPLANT
SNARE SNG USE RND 15MM (INSTRUMENTS) IMPLANT
SPOT EX ENDOSCOPIC TATTOO (MISCELLANEOUS)
SYR INFLATION 60ML (SYRINGE) IMPLANT
TRAP ETRAP POLY (MISCELLANEOUS) IMPLANT
VARIJECT INJECTOR VIN23 (MISCELLANEOUS)
WATER STERILE IRR 250ML POUR (IV SOLUTION) ×2 IMPLANT
WIRE CRE 18-20MM 8CM F G (MISCELLANEOUS) IMPLANT

## 2018-02-25 NOTE — Op Note (Signed)
[facility] Patient Name: Charlotte Herrera Procedure Date: 02/25/2018 8:10 AM MRN: [id number] Account #: [id number] Date of Birth: 1946/05/14 Admit Type: Outpatient Age: 72 Room: [facility] OR ROOM 01 Gender: Female Note Status: Finalized Procedure:            Upper GI endoscopy Indications:          Diarrhea, Weight loss Providers:            Lucilla Lame MD, MD Referring MD:         Arnetha Courser (Referring MD) Medicines:            Propofol per Anesthesia Complications:        No immediate complications. Procedure:            Pre-Anesthesia Assessment:                       - Prior to the procedure, a History and Physical was                        performed, and patient medications and allergies were                        reviewed. The patient's tolerance of previous                        anesthesia was also reviewed. The risks and benefits of                        the procedure and the sedation options and risks were                        discussed with the patient. All questions were                        answered, and informed consent was obtained. Prior                        Anticoagulants: The patient has taken no previous                        anticoagulant or antiplatelet agents. ASA Grade                        Assessment: II - A patient with mild systemic disease.                        After reviewing the risks and benefits, the patient was                        deemed in satisfactory condition to undergo the                        procedure.                       After obtaining informed consent, the endoscope was                        passed under direct vision. Throughout the procedure,  the patient's blood pressure, pulse, and oxygen                        saturations were monitored continuously. The was                        introduced through the mouth, and advanced to the                        second part  of duodenum. The upper GI endoscopy was                        accomplished without difficulty. The patient tolerated                        the procedure well. Findings:      The examined esophagus was normal.      Localized mild inflammation characterized by erythema was found in the       gastric antrum. Biopsies were taken with a cold forceps for histology.      The examined duodenum was normal. Biopsies were taken with a cold       forceps for histology. Impression:           - Normal esophagus.                       - Gastritis. Biopsied.                       - Normal examined duodenum. Biopsied. Recommendation:       - Discharge patient to home.                       - Resume previous diet.                       - Continue present medications.                       - Await pathology results.                       - Perform a colonoscopy today. Procedure Code(s):    --- Professional ---                       [phone], Esophagogastroduodenoscopy, flexible, transoral;                        with biopsy, single or multiple Diagnosis Code(s):    --- Professional ---                       R63.4, Abnormal weight loss                       R19.7, Diarrhea, unspecified                       K29.70, Gastritis, unspecified, without bleeding CPT copyright 2017 American Medical Association. All rights reserved. The codes documented in this report are preliminary and upon coder review may  be revised to meet current compliance requirements. Lucilla Lame MD, MD 02/25/2018 8:24:34 AM This report has been signed electronically. Number of Addenda:  0 Note Initiated On: 02/25/2018 8:10 AM      [facility]

## 2018-02-25 NOTE — Anesthesia Preprocedure Evaluation (Signed)
Anesthesia Evaluation  Patient identified by MRN, date of birth, ID band Patient awake    Reviewed: Allergy & Precautions, H&P , NPO status , Patient's Chart, lab work & pertinent test results, reviewed documented beta blocker date and time   History of Anesthesia Complications (+) PONV and history of anesthetic complications  Airway Mallampati: II  TM Distance: >3 FB Neck ROM: full    Dental no notable dental hx.    Pulmonary neg pulmonary ROS,    Pulmonary exam normal breath sounds clear to auscultation       Cardiovascular Exercise Tolerance: Good hypertension, + Peripheral Vascular Disease  Normal cardiovascular exam Rhythm:regular Rate:Normal  Hx of aortic disease and TIAs   Neuro/Psych  Headaches, TIACVA negative psych ROS   GI/Hepatic Neg liver ROS, GERD  ,  Endo/Other  diabetes, Type 2, Insulin DependentHypothyroidism   Renal/GU negative Renal ROS  negative genitourinary   Musculoskeletal   Abdominal   Peds  Hematology negative hematology ROS (+)   Anesthesia Other Findings   Reproductive/Obstetrics negative OB ROS                             Anesthesia Physical Anesthesia Plan  ASA: III  Anesthesia Plan: General   Post-op Pain Management:    Induction:   PONV Risk Score and Plan: Ondansetron and Propofol infusion  Airway Management Planned:   Additional Equipment:   Intra-op Plan:   Post-operative Plan:   Informed Consent: I have reviewed the patients History and Physical, chart, labs and discussed the procedure including the risks, benefits and alternatives for the proposed anesthesia with the patient or authorized representative who has indicated his/her understanding and acceptance.   Dental Advisory Given  Plan Discussed with: CRNA  Anesthesia Plan Comments:         Anesthesia Quick Evaluation

## 2018-02-25 NOTE — Anesthesia Procedure Notes (Signed)
Procedure Name: MAC Date/Time: 02/25/2018 8:13 AM Performed by: Janna Arch, CRNA Pre-anesthesia Checklist: Patient identified, Emergency Drugs available, Suction available and Patient being monitored Patient Re-evaluated:Patient Re-evaluated prior to induction Oxygen Delivery Method: Nasal cannula

## 2018-02-25 NOTE — Anesthesia Postprocedure Evaluation (Signed)
Anesthesia Post Note  Patient: Charlotte Herrera  Procedure(s) Performed: COLONOSCOPY WITH PROPOFOL (N/A ) ESOPHAGOGASTRODUODENOSCOPY (EGD) WITH PROPOFOL (N/A )  Patient location during evaluation: PACU Anesthesia Type: General Level of consciousness: awake and alert Pain management: pain level controlled Vital Signs Assessment: post-procedure vital signs reviewed and stable Respiratory status: spontaneous breathing, nonlabored ventilation, respiratory function stable and patient connected to nasal cannula oxygen Cardiovascular status: blood pressure returned to baseline and stable Postop Assessment: no apparent nausea or vomiting Anesthetic complications: no    Trecia Rogers

## 2018-02-25 NOTE — Transfer of Care (Signed)
Immediate Anesthesia Transfer of Care Note  Patient: Charlotte Herrera  Procedure(s) Performed: COLONOSCOPY WITH PROPOFOL (N/A ) ESOPHAGOGASTRODUODENOSCOPY (EGD) WITH PROPOFOL (N/A )  Patient Location: PACU  Anesthesia Type: General  Level of Consciousness: awake, alert  and patient cooperative  Airway and Oxygen Therapy: Patient Spontanous Breathing and Patient connected to supplemental oxygen  Post-op Assessment: Post-op Vital signs reviewed, Patient's Cardiovascular Status Stable, Respiratory Function Stable, Patent Airway and No signs of Nausea or vomiting  Post-op Vital Signs: Reviewed and stable  Complications: No apparent anesthesia complications

## 2018-02-25 NOTE — H&P (Signed)
Lucilla Lame, MD [address] [place], [place] [address] Phone:[phone] Fax : [phone]  Primary Care Physician:  Arnetha Courser, MD Primary Gastroenterologist:  Dr. Allen Norris  Pre-Procedure History & Physical: HPI:  Charlotte Herrera is a 72 y.o. female is here for an endoscopy and colonoscopy.   Past Medical History:  Diagnosis Date  . Arthritis    hands, shoulders, knees, feet  . Calcification of abdominal aorta (HCC) 09/03/2017   CT scan [facility] Feb 2019  . Cirrhosis ([place]) 09/03/2017   Chest CT [facility] Feb 2019  . Depression   . Diabetes mellitus, type 2 ([place])   . Enlarged liver   . Family history of adverse reaction to anesthesia    Mother - PONV  . Fibromyalgia   . Fibromyalgia affecting multiple sites    pinched nerve in back of neck  . GERD (gastroesophageal reflux disease)   . Headache   . Hyperlipidemia   . Hypertension   . Hypothyroidism   . Motion sickness   . PONV (postoperative nausea and vomiting)   . Stroke ([place])   . Thyroid disease   . TIA (transient ischemic attack)   . Vertigo    after cataract surgery    Past Surgical History:  Procedure Laterality Date  . ABDOMINAL HYSTERECTOMY    . CARPAL TUNNEL RELEASE Left   . COLONOSCOPY  2015  . COLONOSCOPY WITH PROPOFOL N/A 11/05/2017   Procedure: COLONOSCOPY WITH PROPOFOL;  Surgeon: Lucilla Lame, MD;  Location: [place];  Service: Endoscopy;  Laterality: N/A;  Diabetic  . ESOPHAGOGASTRODUODENOSCOPY (EGD) WITH PROPOFOL N/A 11/05/2017   Procedure: ESOPHAGOGASTRODUODENOSCOPY (EGD) WITH PROPOFOL;  Surgeon: Lucilla Lame, MD;  Location: [place];  Service: Endoscopy;  Laterality: N/A;  . SPINE SURGERY    . VARICOSE VEIN SURGERY Left 1975    Prior to Admission medications   Medication Sig Start Date End Date Taking? Authorizing Provider  cetirizine (ZYRTEC) 10 MG tablet Take 10 mg by mouth daily as needed for allergies.   Yes [provider]  clopidogrel (PLAVIX) 75 MG tablet  Take 75 mg by mouth daily.   Yes [provider]  Insulin Degludec-Liraglutide (XULTOPHY) 100-3.6 UNIT-MG/ML SOPN Inject 40 Units into the skin daily. 02/01/18  Yes Lada, Satira Anis, MD  ketoconazole (NIZORAL) 2 % cream Apply 1 application topically daily as needed for irritation. Where needed 12/14/17  Yes Lada, Satira Anis, MD  levothyroxine (SYNTHROID, LEVOTHROID) 50 MCG tablet TAKE 1 TABLET(50 MCG) BY MOUTH DAILY BEFORE BREAKFAST 12/14/17  Yes Lada, Satira Anis, MD  losartan (COZAAR) 50 MG tablet Take 1 tablet (50 mg total) by mouth daily. 12/14/17  Yes Lada, Satira Anis, MD  metFORMIN (GLUCOPHAGE) 500 MG tablet Take 1 tablet (500 mg total) by mouth 2 (two) times daily with a meal. 10/08/17  Yes Lada, Satira Anis, MD  pantoprazole (PROTONIX) 20 MG tablet Take 1 tablet (20 mg total) by mouth daily. (for heartburn, reflux) 09/03/17  Yes Lada, Satira Anis, MD  pravastatin (PRAVACHOL) 20 MG tablet Take 1 tablet (20 mg total) by mouth at bedtime. For cholesterol 11/09/17  Yes Lada, Satira Anis, MD  traZODone (DESYREL) 50 MG tablet Take 0.5-1 tablets (25-50 mg total) by mouth at bedtime as needed for sleep. 02/01/18  Yes Lada, Satira Anis, MD  b complex vitamins tablet Take 1 tablet by mouth daily.    [provider]  Blood Glucose Monitoring Suppl (ONE TOUCH ULTRA MINI) W/DEVICE KIT 1 Device by Does not  apply route 2 (two) times daily. 02/12/15   Roselee Nova, MD  buPROPion (WELLBUTRIN XL) 150 MG 24 hr tablet Take 1 tablet (150 mg total) by mouth daily. Patient not taking: Reported on 02/21/2018 02/01/18   Arnetha Courser, MD  fluticasone (FLONASE) 50 MCG/ACT nasal spray Place 2 sprays into both nostrils daily. Patient not taking: Reported on 02/21/2018 12/05/15   Rochel Brome A, MD  glucose blood (ONE TOUCH ULTRA TEST) test strip TEST twice a day 06/09/17   Roselee Nova, MD  Insulin Pen Needle (B-D UF III MINI PEN NEEDLES) 31G X 5 MM MISC USE AS DIRECTED AT BEDTIME DX:E11.42 Lon:99 months 02/01/18    Lada, Satira Anis, MD  [facility] DELICA LANCETS 54Y MISC 33 g by Does not apply route 2 (two) times daily. Test twice a day as directed 02/12/15   Roselee Nova, MD    Allergies as of 01/13/2018 - Review Complete 12/14/2017  Allergen Reaction Noted  . Aspirin  12/25/2014  . Duloxetine hcl  12/25/2014  . Penicillins  12/25/2014  . Codeine Nausea And Vomiting and Rash 04/11/2013  . Lyrica [pregabalin] Nausea And Vomiting 04/11/2013  . Sulfa antibiotics Swelling 04/11/2013    Family History  Problem Relation Age of Onset  . Cancer Mother   . Ovarian cancer Mother   . Diabetes Father   . Cancer Father   . Prostate cancer Father   . Heart disease Father   . Diabetes Brother   . Cancer Brother   . Breast cancer Maternal Aunt        40's  . Heart disease Daughter   . Healthy Daughter   . Healthy Daughter   . Ovarian cancer Maternal Aunt   . Liver cancer Maternal Grandmother   . Heart failure Maternal Grandfather     Social History   Socioeconomic History  . Marital status: Divorced    Spouse name: Not on file  . Number of children: 3  . Years of education: some college  . Highest education level: GED or equivalent  Occupational History  . Occupation: Surveyor, mining:  [employer]  Social Needs  . Financial resource strain: Not hard at all  . Food insecurity:    Worry: Never true    Inability: Never true  . Transportation needs:    Medical: No    Non-medical: No  Tobacco Use  . Smoking status: Never Smoker  . Smokeless tobacco: Never Used  . Tobacco comment: smoking cessation materials not required  Substance and Sexual Activity  . Alcohol use: No  . Drug use: No  . Sexual activity: Never  Lifestyle  . Physical activity:    Days per week: 3 days    Minutes per session: 10 min  . Stress: Very much  Relationships  . Social connections:    Talks on phone: Once a week    Gets together: Once a week    Attends religious service: Never    Active  member of club or organization: No    Attends meetings of clubs or organizations: Never    Relationship status: Not on file  . Intimate partner violence:    Fear of current or ex partner: No    Emotionally abused: No    Physically abused: No    Forced sexual activity: No  Other Topics Concern  . Not on file  Social History Narrative  . Not on file  Review of Systems: See HPI, otherwise negative ROS  Physical Exam: BP 129/70   Pulse 79   Temp 97.9 F (36.6 C)   Resp 16   Ht 5' 4"  (1.626 m)   Wt 71.7 kg   SpO2 97%   BMI 27.12 kg/m  General:   Alert,  pleasant and cooperative in NAD Head:  Normocephalic and atraumatic. Neck:  Supple; no masses or thyromegaly. Lungs:  Clear throughout to auscultation.    Heart:  Regular rate and rhythm. Abdomen:  Soft, nontender and nondistended. Normal bowel sounds, without guarding, and without rebound.   Neurologic:  Alert and  oriented x4;  grossly normal neurologically.  Impression/Plan: Charlotte Herrera is here for an endoscopy and colonoscopy to be performed for weight loss and diarrhea  Risks, benefits, limitations, and alternatives regarding  endoscopy and colonoscopy have been reviewed with the patient.  Questions have been answered.  All parties agreeable.   Lucilla Lame, MD  02/25/2018, 7:36 AM

## 2018-02-25 NOTE — Op Note (Signed)
[facility] Patient Name: Charlotte Herrera Procedure Date: 02/25/2018 8:24 AM MRN: [id number] Account #: [id number] Date of Birth: 1945/07/27 Admit Type: Outpatient Age: 72 Room: [facility] OR ROOM 01 Gender: Female Note Status: Finalized Procedure:            Colonoscopy Indications:          Chronic diarrhea, Weight loss Providers:            Lucilla Lame MD, MD Referring MD:         Arnetha Courser (Referring MD) Medicines:            Propofol per Anesthesia Complications:        No immediate complications. Procedure:            Pre-Anesthesia Assessment:                       - Prior to the procedure, a History and Physical was                        performed, and patient medications and allergies were                        reviewed. The patient's tolerance of previous                        anesthesia was also reviewed. The risks and benefits of                        the procedure and the sedation options and risks were                        discussed with the patient. All questions were                        answered, and informed consent was obtained. Prior                        Anticoagulants: The patient has taken no previous                        anticoagulant or antiplatelet agents. ASA Grade                        Assessment: II - A patient with mild systemic disease.                        After reviewing the risks and benefits, the patient was                        deemed in satisfactory condition to undergo the                        procedure.                       After obtaining informed consent, the colonoscope was                        passed under direct vision. Throughout the procedure,  the patient's blood pressure, pulse, and oxygen                        saturations were monitored continuously. The was                        introduced through the anus and advanced to the the   terminal ileum. The colonoscopy was performed without                        difficulty. The patient tolerated the procedure well.                        The quality of the bowel preparation was excellent. Findings:      The perianal and digital rectal examinations were normal.      The colon (entire examined portion) appeared normal. Random biopsies       were obtained with cold forceps for histology randomly in the entire       colon.      The terminal ileum appeared normal. Biopsies were taken with a cold       forceps for histology. Impression:           - The entire examined colon is normal.                       - The examined portion of the ileum was normal.                        Biopsied.                       - Random biopsies were obtained in the entire colon. Recommendation:       - Discharge patient to home.                       - Resume previous diet.                       - Continue present medications.                       - Repeat colonoscopy in 10 years for screening unless                        any change in family history or lower GI problems. Procedure Code(s):    --- Professional ---                       [phone], Colonoscopy, flexible; with biopsy, single or                        multiple Diagnosis Code(s):    --- Professional ---                       R63.4, Abnormal weight loss                       K52.9, Noninfective gastroenteritis and colitis,                        unspecified CPT copyright 2017 American Medical Association. All rights  reserved. The codes documented in this report are preliminary and upon coder review may  be revised to meet current compliance requirements. Lucilla Lame MD, MD 02/25/2018 8:39:58 AM This report has been signed electronically. Number of Addenda: 0 Note Initiated On: 02/25/2018 8:24 AM Scope Withdrawal Time: 0 hours 7 minutes 55 seconds  Total Procedure Duration: 0 hours 11 minutes 33 seconds       [facility]

## 2018-03-01 DIAGNOSIS — E113393 Type 2 diabetes mellitus with moderate nonproliferative diabetic retinopathy without macular edema, bilateral: Secondary | ICD-10-CM | POA: Diagnosis not present

## 2018-03-01 LAB — HM DIABETES EYE EXAM

## 2018-03-03 ENCOUNTER — Encounter: Payer: Self-pay | Admitting: Gastroenterology

## 2018-03-04 ENCOUNTER — Telehealth: Payer: Self-pay | Admitting: Gastroenterology

## 2018-03-04 ENCOUNTER — Telehealth: Payer: Self-pay | Admitting: Family Medicine

## 2018-03-04 ENCOUNTER — Ambulatory Visit: Payer: PPO | Admitting: Neurology

## 2018-03-04 NOTE — Telephone Encounter (Signed)
Copied from [place] [id number] [id number]. Topic: Quick Communication - Rx Refill/Question >> Mar 04, 2018 10:00 AM Scherrie Gerlach wrote: Medication: Insulin Degludec-Liraglutide (XULTOPHY) 100-3.6 UNIT-MG/ML SOPN  Pt calling to request samples of this insulin.

## 2018-03-04 NOTE — Telephone Encounter (Signed)
Pt is calling for results

## 2018-03-04 NOTE — Telephone Encounter (Signed)
Pt.notified

## 2018-03-04 NOTE — Telephone Encounter (Signed)
Pt advised results are not available yet. Will contact pt when reviewed.

## 2018-03-10 ENCOUNTER — Telehealth: Payer: Self-pay

## 2018-03-10 NOTE — Telephone Encounter (Signed)
-----   Message from Lucilla Lame, MD sent at 03/10/2018 10:03 AM EDT ----- Please have the patient come in for a follow up.

## 2018-03-11 NOTE — Telephone Encounter (Signed)
Pt has been scheduled for a follow up appt with Dr. Allen Norris.

## 2018-03-16 ENCOUNTER — Ambulatory Visit (INDEPENDENT_AMBULATORY_CARE_PROVIDER_SITE_OTHER): Payer: PPO

## 2018-03-16 ENCOUNTER — Ambulatory Visit (INDEPENDENT_AMBULATORY_CARE_PROVIDER_SITE_OTHER): Payer: PPO | Admitting: Vascular Surgery

## 2018-03-16 ENCOUNTER — Encounter (INDEPENDENT_AMBULATORY_CARE_PROVIDER_SITE_OTHER): Payer: Self-pay | Admitting: Vascular Surgery

## 2018-03-16 VITALS — BP 144/75 | HR 75 | Resp 16 | Ht 64.0 in | Wt 158.8 lb

## 2018-03-16 DIAGNOSIS — M79604 Pain in right leg: Secondary | ICD-10-CM

## 2018-03-16 DIAGNOSIS — M79605 Pain in left leg: Secondary | ICD-10-CM

## 2018-03-16 DIAGNOSIS — I6529 Occlusion and stenosis of unspecified carotid artery: Secondary | ICD-10-CM | POA: Diagnosis not present

## 2018-03-16 DIAGNOSIS — Z9889 Other specified postprocedural states: Secondary | ICD-10-CM | POA: Diagnosis not present

## 2018-03-16 NOTE — Progress Notes (Signed)
Subjective:    Patient ID: Charlotte Herrera, female    DOB: 1945/12/26, 72 y.o.   MRN: [id number] Chief Complaint  Patient presents with  . Follow-up    Bilateral ABI and Carotid   Patient last seen on January 04, 2018 for evaluation of bilateral lower extremity discomfort.  The patient presents today to review vascular studies.  The patient does have a past medical history of a right carotid endarterectomy in 2006 so a carotid duplex was ordered.  The patient underwent a bilateral carotid duplex which was notable for a widely patent right CEA.  Left carotid artery: 1 to 39% stenosis.  Bilateral vertebral arteries are antegrade.  Bilateral subclavian arteries are multiphasic and within normal limits. The patient denies experiencing Amaurosis Fugax, TIA like symptoms or focal motor deficits.  The patient underwent a bilateral ABI which was notable for Right: 1.13, triphasic tibials. Left: 1.17, triphasic tibials.  No evidence of significant bilateral lower extremity arterial disease was noted on today's ABI.  The patient also underwent a bilateral venous reflux study which left for the left great saphenous vein stripped over 3 years ago.  No evidence of chronic venous insufficiency to the right superficial system.  No evidence of deep vein thrombosis or superficial venous thrombosis.  Patient denies any rest of her bilateral lower extremity edema. The patient denies any rest pain or ulcer form of bilateral legs. The patient denies any fever, nausea vomiting.  Review of Systems  Constitutional: Negative.   HENT: Negative.   Eyes: Negative.   Respiratory: Negative.   Cardiovascular:       Lower Extremity Pain  Gastrointestinal: Negative.   Endocrine: Negative.   Genitourinary: Negative.   Musculoskeletal: Negative.   Skin: Negative.   Allergic/Immunologic: Negative.   Neurological: Negative.   Hematological: Negative.   Psychiatric/Behavioral: Negative.       Objective:   Physical Exam    Constitutional: She is oriented to person, place, and time. She appears well-developed and well-nourished. No distress.  HENT:  Head: Normocephalic and atraumatic.  Right Ear: External ear normal.  Left Ear: External ear normal.  Eyes: Pupils are equal, round, and reactive to light. Conjunctivae and EOM are normal.  Neck: Normal range of motion.  Cardiovascular: Normal rate, regular rhythm, normal heart sounds and intact distal pulses.  Pulses:      Radial pulses are 2+ on the right side, and 2+ on the left side.       Dorsalis pedis pulses are 2+ on the right side, and 2+ on the left side.       Posterior tibial pulses are 2+ on the right side, and 2+ on the left side.  Pulmonary/Chest: Effort normal and breath sounds normal.  Musculoskeletal: Normal range of motion. She exhibits no edema.  Neurological: She is alert and oriented to person, place, and time.  Skin: Skin is warm and dry. She is not diaphoretic.  Psychiatric: She has a normal mood and affect. Her behavior is normal. Judgment and thought content normal.  Vitals reviewed.  BP (!) 144/75 (BP Location: Right Arm)   Pulse 75   Resp 16   Ht 5' 4" (1.626 m)   Wt 158 lb 12.8 oz (72 kg)   BMI 27.26 kg/m   Past Medical History:  Diagnosis Date  . Arthritis    hands, shoulders, knees, feet  . Calcification of abdominal aorta (HCC) 09/03/2017   CT scan [facility] Feb 2019  . Cirrhosis ([place]) 09/03/2017   Chest  CT [facility] Feb 2019  . Depression   . Diabetes mellitus, type 2 ([place])   . Enlarged liver   . Family history of adverse reaction to anesthesia    Mother - PONV  . Fibromyalgia   . Fibromyalgia affecting multiple sites    pinched nerve in back of neck  . GERD (gastroesophageal reflux disease)   . Headache   . Hyperlipidemia   . Hypertension   . Hypothyroidism   . Motion sickness   . PONV (postoperative nausea and vomiting)   . Stroke ([place])   . Thyroid disease   . TIA (transient ischemic attack)   . Vertigo    after  cataract surgery   Social History   Socioeconomic History  . Marital status: Divorced    Spouse name: Not on file  . Number of children: 3  . Years of education: some college  . Highest education level: GED or equivalent  Occupational History  . Occupation: Surveyor, mining:  [employer]  Social Needs  . Financial resource strain: Not hard at all  . Food insecurity:    Worry: Never true    Inability: Never true  . Transportation needs:    Medical: No    Non-medical: No  Tobacco Use  . Smoking status: Never Smoker  . Smokeless tobacco: Never Used  . Tobacco comment: smoking cessation materials not required  Substance and Sexual Activity  . Alcohol use: No  . Drug use: No  . Sexual activity: Never  Lifestyle  . Physical activity:    Days per week: 3 days    Minutes per session: 10 min  . Stress: Very much  Relationships  . Social connections:    Talks on phone: Once a week    Gets together: Once a week    Attends religious service: Never    Active member of club or organization: No    Attends meetings of clubs or organizations: Never    Relationship status: Not on file  . Intimate partner violence:    Fear of current or ex partner: No    Emotionally abused: No    Physically abused: No    Forced sexual activity: No  Other Topics Concern  . Not on file  Social History Narrative  . Not on file   Past Surgical History:  Procedure Laterality Date  . ABDOMINAL HYSTERECTOMY    . CARPAL TUNNEL RELEASE Left   . COLONOSCOPY  2015  . COLONOSCOPY WITH PROPOFOL N/A 11/05/2017   Procedure: COLONOSCOPY WITH PROPOFOL;  Surgeon: Lucilla Lame, MD;  Location: [place];  Service: Endoscopy;  Laterality: N/A;  Diabetic  . COLONOSCOPY WITH PROPOFOL N/A 02/25/2018   Procedure: COLONOSCOPY WITH PROPOFOL;  Surgeon: Lucilla Lame, MD;  Location: [place];  Service: Endoscopy;  Laterality: N/A;  . ESOPHAGOGASTRODUODENOSCOPY (EGD) WITH PROPOFOL N/A  11/05/2017   Procedure: ESOPHAGOGASTRODUODENOSCOPY (EGD) WITH PROPOFOL;  Surgeon: Lucilla Lame, MD;  Location: [place];  Service: Endoscopy;  Laterality: N/A;  . ESOPHAGOGASTRODUODENOSCOPY (EGD) WITH PROPOFOL N/A 02/25/2018   Procedure: ESOPHAGOGASTRODUODENOSCOPY (EGD) WITH PROPOFOL;  Surgeon: Lucilla Lame, MD;  Location: [place];  Service: Endoscopy;  Laterality: N/A;  diabetic - insulin and oral meds  . SPINE SURGERY    . VARICOSE VEIN SURGERY Left 1975   Family History  Problem Relation Age of Onset  . Cancer Mother   . Ovarian cancer Mother   . Diabetes Father   . Cancer Father   .  Prostate cancer Father   . Heart disease Father   . Diabetes Brother   . Cancer Brother   . Breast cancer Maternal Aunt        40's  . Heart disease Daughter   . Healthy Daughter   . Healthy Daughter   . Ovarian cancer Maternal Aunt   . Liver cancer Maternal Grandmother   . Heart failure Maternal Grandfather    Allergies  Allergen Reactions  . Aspirin   . Duloxetine Hcl   . Penicillins   . Codeine Nausea And Vomiting and Rash  . Lyrica [Pregabalin] Nausea And Vomiting  . Sulfa Antibiotics Swelling      Assessment & Plan:  Patient last seen on January 04, 2018 for evaluation of bilateral lower extremity discomfort.  The patient presents today to review vascular studies.  The patient does have a past medical history of a right carotid endarterectomy in 2006 so a carotid duplex was ordered.  The patient underwent a bilateral carotid duplex which was notable for a widely patent right CEA.  Left carotid artery: 1 to 39% stenosis.  Bilateral vertebral arteries are antegrade.  Bilateral subclavian arteries are multiphasic and within normal limits. The patient denies experiencing Amaurosis Fugax, TIA like symptoms or focal motor deficits.  The patient underwent a bilateral ABI which was notable for Right: 1.13, triphasic tibials. Left: 1.17, triphasic tibials.  No evidence of significant  bilateral lower extremity arterial disease was noted on today's ABI.  The patient also underwent a bilateral venous reflux study which left for the left great saphenous vein stripped over 3 years ago.  No evidence of chronic venous insufficiency to the right superficial system.  No evidence of deep vein thrombosis or superficial venous thrombosis.  Patient denies any rest of her bilateral lower extremity edema.  The patient denies any rest pain or ulcer form of bilateral legs.  The patient denies any fever, nausea vomiting.  1) Carotid Stenosis - Stable Studies reviewed with patient. Patient asymptomatic with stable duplex.  No intervention at this time. Patient to return in two year for surveillance carotid duplex. Patient to continue medical optimization with Plavix and dyslipidemia medication. Patient to remain abstinent of tobacco use. I have discussed with the patient at length the risk factors for and pathogenesis of atherosclerotic disease and encouraged a healthy diet, regular exercise regimen and blood pressure / glucose control.  Patient was instructed to contact our office in the interim with problems such as arm / leg weakness or numbness, speech / swallowing difficulty or temporary monocular blindness. The patient expresses their understanding.  The patient does not want to make a follow-up doctor appointment until she sees Dr. Sanda Klein.  2) Limb Pain - Stable Studies reviewed with patient. Palpable pulses on exam Normal ABI - arterial insufficiency Normal venous duplex without any venous insufficiency Follow up PRN Patient to remain abstinent of tobacco use. I have discussed with the patient at length the risk factors for and pathogenesis of atherosclerotic disease and encouraged a healthy diet, regular exercise regimen and blood pressure / glucose control.  The patient was encouraged to call the office in the interim if he experiences any claudication like symptoms, rest pain or  ulcers to his feet / toes.  The patient does not want to make a follow-up doctor appointment until she sees Dr. Sanda Klein.  Current Outpatient Medications on File Prior to Visit  Medication Sig Dispense Refill  . Blood Glucose Monitoring Suppl (ONE TOUCH ULTRA MINI) W/DEVICE KIT  1 Device by Does not apply route 2 (two) times daily. 1 each 0  . cetirizine (ZYRTEC) 10 MG tablet Take 10 mg by mouth daily as needed for allergies.    [name] Kitchen clopidogrel (PLAVIX) 75 MG tablet Take 75 mg by mouth daily.    [name] Kitchen gabapentin (NEURONTIN) 300 MG capsule TAKE 1 CAPSULE(300 MG) BY MOUTH AT BEDTIME    . glucose blood (ONE TOUCH ULTRA TEST) test strip TEST twice a day 180 each 3  . Insulin Degludec-Liraglutide (XULTOPHY) 100-3.6 UNIT-MG/ML SOPN Inject 40 Units into the skin daily. 5 pen 5  . Insulin Pen Needle (B-D UF III MINI PEN NEEDLES) 31G X 5 MM MISC USE AS DIRECTED AT BEDTIME DX:E11.42 Lon:99 months 100 each 1  . ketoconazole (NIZORAL) 2 % cream Apply 1 application topically daily as needed for irritation. Where needed 60 g 2  . levothyroxine (SYNTHROID, LEVOTHROID) 50 MCG tablet TAKE 1 TABLET(50 MCG) BY MOUTH DAILY BEFORE BREAKFAST 90 tablet 0  . losartan (COZAAR) 50 MG tablet Take 1 tablet (50 mg total) by mouth daily. 90 tablet 3  . metFORMIN (GLUCOPHAGE) 500 MG tablet Take 1 tablet (500 mg total) by mouth 2 (two) times daily with a meal. 60 tablet 0  . ONETOUCH DELICA LANCETS 44B MISC 33 g by Does not apply route 2 (two) times daily. Test twice a day as directed 100 each 2  . pantoprazole (PROTONIX) 20 MG tablet Take 1 tablet (20 mg total) by mouth daily. (for heartburn, reflux) 90 tablet 0  . pravastatin (PRAVACHOL) 20 MG tablet Take 1 tablet (20 mg total) by mouth at bedtime. For cholesterol 90 tablet 3  . traZODone (DESYREL) 50 MG tablet Take 0.5-1 tablets (25-50 mg total) by mouth at bedtime as needed for sleep. 30 tablet 5  . b complex vitamins tablet Take 1 tablet by mouth daily.    [name] Kitchen buPROPion (WELLBUTRIN  XL) 150 MG 24 hr tablet Take 1 tablet (150 mg total) by mouth daily. (Patient not taking: Reported on 02/21/2018) 30 tablet 0  . dexlansoprazole (DEXILANT) 60 MG capsule Dexilant 60 mg capsule, delayed release    . fluticasone (FLONASE) 50 MCG/ACT nasal spray Place 2 sprays into both nostrils daily. (Patient not taking: Reported on 02/21/2018) 16 g 2   No current facility-administered medications on file prior to visit.    There are no Patient Instructions on file for this visit. No follow-ups on file.  Dyon Rotert A Juliano Mceachin, PA-C

## 2018-03-28 ENCOUNTER — Telehealth: Payer: Self-pay | Admitting: Gastroenterology

## 2018-03-28 ENCOUNTER — Ambulatory Visit: Payer: PPO | Admitting: Gastroenterology

## 2018-03-28 NOTE — Telephone Encounter (Signed)
Pt called to cancel apt for today she states she feels very sick . She would like her colonoscopy results please call pt

## 2018-03-28 NOTE — Progress Notes (Deleted)
Primary Care Physician: Arnetha Courser, MD  Primary Gastroenterologist:  Dr. Lucilla Lame  No chief complaint on file.   HPI: Charlotte Herrera is a 72 y.o. female here for follow-up after having an EGD and colonoscopy.  The patient had these procedures performed due to weight loss and diarrhea.  The patient had biopsies of the stomach small bowel and colon taken.  There was some reactive changes on the upper endoscopy with normal biopsies of the colon and terminal ileum.  There are no polyps or masses seen.  The patient was also negative for H. pylori.  Current Outpatient Medications  Medication Sig Dispense Refill  . b complex vitamins tablet Take 1 tablet by mouth daily.    . Blood Glucose Monitoring Suppl (ONE TOUCH ULTRA MINI) W/DEVICE KIT 1 Device by Does not apply route 2 (two) times daily. 1 each 0  . buPROPion (WELLBUTRIN XL) 150 MG 24 hr tablet Take 1 tablet (150 mg total) by mouth daily. (Patient not taking: Reported on 02/21/2018) 30 tablet 0  . cetirizine (ZYRTEC) 10 MG tablet Take 10 mg by mouth daily as needed for allergies.    [name] Kitchen clopidogrel (PLAVIX) 75 MG tablet Take 75 mg by mouth daily.    [name] Kitchen dexlansoprazole (DEXILANT) 60 MG capsule Dexilant 60 mg capsule, delayed release    . fluticasone (FLONASE) 50 MCG/ACT nasal spray Place 2 sprays into both nostrils daily. (Patient not taking: Reported on 02/21/2018) 16 g 2  . gabapentin (NEURONTIN) 300 MG capsule TAKE 1 CAPSULE(300 MG) BY MOUTH AT BEDTIME    . glucose blood (ONE TOUCH ULTRA TEST) test strip TEST twice a day 180 each 3  . Insulin Degludec-Liraglutide (XULTOPHY) 100-3.6 UNIT-MG/ML SOPN Inject 40 Units into the skin daily. 5 pen 5  . Insulin Pen Needle (B-D UF III MINI PEN NEEDLES) 31G X 5 MM MISC USE AS DIRECTED AT BEDTIME DX:E11.42 Lon:99 months 100 each 1  . ketoconazole (NIZORAL) 2 % cream Apply 1 application topically daily as needed for irritation. Where needed 60 g 2  . levothyroxine (SYNTHROID, LEVOTHROID) 50 MCG tablet  TAKE 1 TABLET(50 MCG) BY MOUTH DAILY BEFORE BREAKFAST 90 tablet 0  . losartan (COZAAR) 50 MG tablet Take 1 tablet (50 mg total) by mouth daily. 90 tablet 3  . metFORMIN (GLUCOPHAGE) 500 MG tablet Take 1 tablet (500 mg total) by mouth 2 (two) times daily with a meal. 60 tablet 0  . ONETOUCH DELICA LANCETS 68L MISC 33 g by Does not apply route 2 (two) times daily. Test twice a day as directed 100 each 2  . pantoprazole (PROTONIX) 20 MG tablet Take 1 tablet (20 mg total) by mouth daily. (for heartburn, reflux) 90 tablet 0  . pravastatin (PRAVACHOL) 20 MG tablet Take 1 tablet (20 mg total) by mouth at bedtime. For cholesterol 90 tablet 3  . traZODone (DESYREL) 50 MG tablet Take 0.5-1 tablets (25-50 mg total) by mouth at bedtime as needed for sleep. 30 tablet 5   No current facility-administered medications for this visit.     Allergies as of 03/28/2018 - Review Complete 03/16/2018  Allergen Reaction Noted  . Aspirin  12/25/2014  . Duloxetine hcl  12/25/2014  . Penicillins  12/25/2014  . Codeine Nausea And Vomiting and Rash 04/11/2013  . Lyrica [pregabalin] Nausea And Vomiting 04/11/2013  . Sulfa antibiotics Swelling 04/11/2013    ROS:  General: Negative for anorexia, weight loss, fever, chills, fatigue, weakness. ENT: Negative for hoarseness, difficulty swallowing , nasal  congestion. CV: Negative for chest pain, angina, palpitations, dyspnea on exertion, peripheral edema.  Respiratory: Negative for dyspnea at rest, dyspnea on exertion, cough, sputum, wheezing.  GI: See history of present illness. GU:  Negative for dysuria, hematuria, urinary incontinence, urinary frequency, nocturnal urination.  Endo: Negative for unusual weight change.    Physical Examination:   There were no vitals taken for this visit.  General: Well-nourished, well-developed in no acute distress.  Eyes: No icterus. Conjunctivae pink. Mouth: Oropharyngeal mucosa moist and pink , no lesions erythema or  exudate. Lungs: Clear to auscultation bilaterally. Non-labored. Heart: Regular rate and rhythm, no murmurs rubs or gallops.  Abdomen: Bowel sounds are normal, nontender, nondistended, no hepatosplenomegaly or masses, no abdominal bruits or hernia , no rebound or guarding.   Extremities: No lower extremity edema. No clubbing or deformities. Neuro: Alert and oriented x 3.  Grossly intact. Skin: Warm and dry, no jaundice.   Psych: Alert and cooperative, normal mood and affect.  Labs:  ***  Imaging Studies: No results found.  Assessment and Plan:   Charlotte Herrera is a 72 y.o. y/o female ***    Lucilla Lame, MD. Marval Regal   Note: This dictation was prepared with Dragon dictation along with smaller phrase technology. Any transcriptional errors that result from this process are unintentional.

## 2018-03-31 NOTE — Telephone Encounter (Addendum)
Please advise of results. You had wanted her to come in to discuss, but she cancelled.  If you still want her to reschedule, let me know and I will call her.

## 2018-03-31 NOTE — Telephone Encounter (Signed)
Let the patient know that her biopsies did not show any cause for her symptoms and if she canceled her appointment and and does not want to come in that is up to her.  If she is fine with getting the results over the phone then that is okay with me.

## 2018-04-19 ENCOUNTER — Ambulatory Visit (INDEPENDENT_AMBULATORY_CARE_PROVIDER_SITE_OTHER): Payer: PPO | Admitting: Family Medicine

## 2018-04-19 ENCOUNTER — Encounter: Payer: Self-pay | Admitting: Family Medicine

## 2018-04-19 VITALS — BP 138/80 | HR 85 | Temp 97.7°F | Resp 16 | Ht 64.0 in | Wt 160.8 lb

## 2018-04-19 DIAGNOSIS — I129 Hypertensive chronic kidney disease with stage 1 through stage 4 chronic kidney disease, or unspecified chronic kidney disease: Secondary | ICD-10-CM | POA: Diagnosis not present

## 2018-04-19 DIAGNOSIS — I6529 Occlusion and stenosis of unspecified carotid artery: Secondary | ICD-10-CM | POA: Diagnosis not present

## 2018-04-19 DIAGNOSIS — D485 Neoplasm of uncertain behavior of skin: Secondary | ICD-10-CM

## 2018-04-19 DIAGNOSIS — R809 Proteinuria, unspecified: Secondary | ICD-10-CM

## 2018-04-19 DIAGNOSIS — E559 Vitamin D deficiency, unspecified: Secondary | ICD-10-CM

## 2018-04-19 DIAGNOSIS — E1122 Type 2 diabetes mellitus with diabetic chronic kidney disease: Secondary | ICD-10-CM | POA: Diagnosis not present

## 2018-04-19 DIAGNOSIS — Z23 Encounter for immunization: Secondary | ICD-10-CM

## 2018-04-19 DIAGNOSIS — E1165 Type 2 diabetes mellitus with hyperglycemia: Secondary | ICD-10-CM

## 2018-04-19 DIAGNOSIS — E1142 Type 2 diabetes mellitus with diabetic polyneuropathy: Secondary | ICD-10-CM | POA: Diagnosis not present

## 2018-04-19 DIAGNOSIS — K7469 Other cirrhosis of liver: Secondary | ICD-10-CM | POA: Diagnosis not present

## 2018-04-19 DIAGNOSIS — IMO0002 Reserved for concepts with insufficient information to code with codable children: Secondary | ICD-10-CM

## 2018-04-19 DIAGNOSIS — E538 Deficiency of other specified B group vitamins: Secondary | ICD-10-CM | POA: Diagnosis not present

## 2018-04-19 DIAGNOSIS — I700 Atherosclerosis of aorta: Secondary | ICD-10-CM

## 2018-04-19 MED ORDER — METFORMIN HCL 500 MG PO TABS: 500.0000 mg | ORAL_TABLET | Freq: Every day | ORAL | 5 refills | Status: DC

## 2018-04-19 MED ORDER — INSULIN GLARGINE 100 UNIT/ML SOLOSTAR PEN: 30.0000 [IU] | PEN_INJECTOR | Freq: Every day | SUBCUTANEOUS | 11 refills | Status: DC

## 2018-04-19 MED ORDER — INSULIN DEGLUDEC-LIRAGLUTIDE 100-3.6 UNIT-MG/ML ~~LOC~~ SOPN: 30.0000 [IU] | PEN_INJECTOR | Freq: Every day | SUBCUTANEOUS | 5 refills | Status: DC

## 2018-04-19 NOTE — Assessment & Plan Note (Signed)
Managed by vascular  

## 2018-04-19 NOTE — Assessment & Plan Note (Signed)
Goal LDL is less than 70; continue statin

## 2018-04-19 NOTE — Assessment & Plan Note (Addendum)
Managed by Dr. Allen Norris, GI; no alcohol; be careful about acetaminophen, check with GI first about how much is safe

## 2018-04-19 NOTE — Assessment & Plan Note (Signed)
Checked feet today; check labs; try to limit sweets

## 2018-04-19 NOTE — Assessment & Plan Note (Signed)
Well-controlled today; limit salt 

## 2018-04-19 NOTE — Assessment & Plan Note (Signed)
Check vitamin B12 

## 2018-04-19 NOTE — Progress Notes (Signed)
BP 138/80 (BP Location: Left Arm, Patient Position: Sitting, Cuff Size: Normal)   Pulse 85   Temp 97.7 F (36.5 C) (Oral)   Resp 16   Ht 5\' 4"  (1.626 m)   Wt 160 lb 12.8 oz (72.9 kg)   SpO2 99%   BMI 27.60 kg/m    Subjective:    Patient ID: Charlotte Herrera, female    DOB: Feb 04, 1946, 72 y.o.   MRN: [id number]  HPI: Charlotte Herrera is a 72 y.o. female  Chief Complaint  Patient presents with  . Diabetes  . Hypertension    HPI Patient is here for f/u  Type 2 diabetes Checking FSBS every morning; lowest was 98 and none over 125 in the last two weeks; some dry mouth; no nocturia On Xultophy and the metformin She had cut back to just the morning metformin; she stopped the night time metformin  Lab Results  Component Value Date   HGBA1C 6.3 (H) 04/26/2018    Place on the right side of the nose; saw Dr. Nehemiah Massed who froze it over a year ago, and she wants to see the lady doctor there  She just didn't get around to getting labs done  Vitamin D deficiency in the past; she is not taking any over-the-counter right now; she had been taking it but it made her hungry; does feel tired  Depression; never did start the wellbutrin; she went to a health fair at work and she got information there on healthy sleep habits; no TV in the bedroom; she thinks she is doing okay right now with her mood  High cholesterol; not many fatty meats; taking pravastatin  Acid reflux is feeling better; she saw GI and had colonoscopy in August and it was entirely normal; next due in 10 years  Aortic athero; on statin  Carotid athero; seeing Dr. Lucky Cowboy sees her for that  HTN; not adding much salt to her food  Cirrhosis; managed by GI  Depression screen [facility] 2/9 02/01/2018 12/14/2017 11/09/2017 09/21/2017 09/03/2017  Decreased Interest 0 0 0 0 1  Down, Depressed, Hopeless 3 1 1  0 1  PHQ - 2 Score 3 1 1  0 2  Altered sleeping 3 0 - - 3  Tired, decreased energy 3 1 - - 3  Change in appetite 3 0 - - 3  Feeling bad or  failure about yourself  0 0 - - 0  Trouble concentrating 0 0 - - 1  Moving slowly or fidgety/restless 0 0 - - 0  Suicidal thoughts 0 0 - - 0  PHQ-9 Score 12 2 - - 12  Difficult doing work/chores Somewhat difficult Not difficult at all - - Very difficult  Some recent data might be hidden   Fall Risk  04/19/2018 02/01/2018 11/09/2017 09/21/2017 09/03/2017  Falls in the past year? No No No No No  Comment - - - - -  Number falls in past yr: - - - - -  Comment - - - - -  Injury with Fall? - - - - -  Comment - - - - -  Risk Factor Category  - - - - -  Risk for fall due to : - - - - -  Risk for fall due to: Comment - - - - -  Follow up - - - - -    Relevant past medical, surgical, family and social history reviewed Past Medical History:  Diagnosis Date  . Arthritis    hands, shoulders, knees, feet  .  Calcification of abdominal aorta (HCC) 09/03/2017   CT scan [facility] Feb 2019  . Cirrhosis ([place]) 09/03/2017   Chest [facility] Feb 2019  . Depression   . Diabetes mellitus, type 2 ([place])   . Enlarged liver   . Family history of adverse reaction to anesthesia    Mother - PONV  . Fibromyalgia   . Fibromyalgia affecting multiple sites    pinched nerve in back of neck  . GERD (gastroesophageal reflux disease)   . Headache   . Hyperlipidemia   . Hypertension   . Hypothyroidism   . Motion sickness   . PONV (postoperative nausea and vomiting)   . Stroke ([place])   . Thyroid disease   . TIA (transient ischemic attack)   . Vertigo    after cataract surgery   Past Surgical History:  Procedure Laterality Date  . ABDOMINAL HYSTERECTOMY    . CARPAL TUNNEL RELEASE Left   . COLONOSCOPY  2015  . COLONOSCOPY WITH PROPOFOL N/A 11/05/2017   Procedure: COLONOSCOPY WITH PROPOFOL;  Surgeon: Lucilla Lame, MD;  Location: [place];  Service: Endoscopy;  Laterality: N/A;  Diabetic  . COLONOSCOPY WITH PROPOFOL N/A 02/25/2018   Procedure: COLONOSCOPY WITH PROPOFOL;  Surgeon: Lucilla Lame, MD;  Location: [place];  Service: Endoscopy;  Laterality: N/A;  . ESOPHAGOGASTRODUODENOSCOPY (EGD) WITH PROPOFOL N/A 11/05/2017   Procedure: ESOPHAGOGASTRODUODENOSCOPY (EGD) WITH PROPOFOL;  Surgeon: Lucilla Lame, MD;  Location: [place];  Service: Endoscopy;  Laterality: N/A;  . ESOPHAGOGASTRODUODENOSCOPY (EGD) WITH PROPOFOL N/A 02/25/2018   Procedure: ESOPHAGOGASTRODUODENOSCOPY (EGD) WITH PROPOFOL;  Surgeon: Lucilla Lame, MD;  Location: [place];  Service: Endoscopy;  Laterality: N/A;  diabetic - insulin and oral meds  . SPINE SURGERY    . VARICOSE VEIN SURGERY Left 1975   Family History  Problem Relation Age of Onset  . Cancer Mother   . Ovarian cancer Mother   . Diabetes Father   . Cancer Father   . Prostate cancer Father   . Heart disease Father   . Diabetes Brother   . Cancer Brother   . Breast cancer Maternal Aunt        40's  . Heart disease Daughter   . Healthy Daughter   . Healthy Daughter   . Ovarian cancer Maternal Aunt   . Liver cancer Maternal Grandmother   . Heart failure Maternal Grandfather    Social History   Tobacco Use  . Smoking status: Never Smoker  . Smokeless tobacco: Never Used  . Tobacco comment: smoking cessation materials not required  Substance Use Topics  . Alcohol use: No  . Drug use: No     Office Visit from 02/01/2018 in [facility]  AUDIT-C Score  0      Interim medical history since last visit reviewed. Allergies and medications reviewed  Review of Systems Per HPI unless specifically indicated above     Objective:    BP 138/80 (BP Location: Left Arm, Patient Position: Sitting, Cuff Size: Normal)   Pulse 85   Temp 97.7 F (36.5 C) (Oral)   Resp 16   Ht 5\' 4"  (1.626 m)   Wt 160 lb 12.8 oz (72.9 kg)   SpO2 99%   BMI 27.60 kg/m   Wt Readings from Last 3 Encounters:  04/19/18 160 lb 12.8 oz (72.9 kg)  03/16/18 158 lb 12.8 oz (72 kg)  02/25/18 158 lb (71.7 kg)    Physical Exam  Constitutional:  She appears  well-developed and well-nourished. No distress.  HENT:  Head: Normocephalic and atraumatic.  Eyes: EOM are normal. No scleral icterus.  Neck: No thyromegaly present.  Cardiovascular: Normal rate, regular rhythm and normal heart sounds.  No murmur heard. Pulmonary/Chest: Effort normal and breath sounds normal. No respiratory distress. She has no wheezes.  Abdominal: Soft. Bowel sounds are normal. She exhibits no distension.  Musculoskeletal: She exhibits no edema.  Neurological: She is alert.  Skin: Skin is warm and dry. Lesion (irregular papular lesion right side of nose) noted. She is not diaphoretic. No pallor.  Psychiatric: She has a normal mood and affect. Her behavior is normal. Judgment and thought content normal.   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 Allen Norris, GI; no alcohol; be careful about acetaminophen, check with GI first about  how much is safe      Relevant Orders   COMPLETE METABOLIC PANEL WITH GFR   Comprehensive metabolic panel (Completed)     Endocrine   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)    Checked feet today; check labs; try to limit sweets      Relevant Medications   metFORMIN (GLUCOPHAGE) 500 MG tablet   Insulin Glargine (LANTUS SOLOSTAR) 100 UNIT/ML Solostar Pen   Other Relevant Orders   COMPLETE METABOLIC PANEL WITH GFR   Hemoglobin A1c (Completed)     Other   Vitamin D deficiency    Check vit D level      Relevant Orders   VITAMIN D 25 Hydroxy (Vit-D Deficiency, Fractures)   VITAMIN D 25 Hydroxy (Vit-D Deficiency, Fractures) (Completed)   Vitamin B12 deficiency    Check vitamin B12      Relevant Orders   Vitamin B12   Vitamin B12 (Completed)   Microalbuminuria    Check labs      Relevant Orders   Microalbumin / creatinine urine ratio   Microalbumin / creatinine urine ratio (Completed)    Other Visit Diagnoses    Flu vaccine need    -  Primary   Relevant Orders   Flu vaccine HIGH DOSE PF (Completed)   Neoplasm of uncertain behavior of skin of nose       Relevant Orders   Ambulatory referral to Dermatology  Hemoglobin A1c       Follow up plan: Return in about 6 months (around 10/19/2018) for follow-up visit with Dr. Sanda Klein (or after).  An after-visit summary was printed and given to the patient at [facility].  Please see the patient instructions which may contain other information and recommendations beyond what is mentioned above in the assessment and plan.  Meds ordered this encounter  Medications  . metFORMIN (GLUCOPHAGE) 500 MG tablet    Sig: Take 1 tablet (500 mg total) by mouth daily with breakfast.    Dispense:  30 tablet    Refill:  5  . DISCONTD: Insulin Degludec-Liraglutide (XULTOPHY) 100-3.6 UNIT-MG/ML SOPN    Sig: Inject 30 Units into the skin daily.    Dispense:  5 pen    Refill:  5  . Insulin Glargine  (LANTUS SOLOSTAR) 100 UNIT/ML Solostar Pen    Sig: Inject 30 Units into the skin at bedtime.    Dispense:  5 pen    Refill:  11    CANCEL the Xultophy, too $$$$; please use Lantus instead    Orders Placed This Encounter  Procedures  . Flu vaccine HIGH DOSE PF  . COMPLETE METABOLIC PANEL WITH GFR  . Hemoglobin A1c  . Microalbumin / creatinine urine ratio  . Lipid panel  . VITAMIN D 25 Hydroxy (Vit-D Deficiency, Fractures)  . Vitamin B12  . Hemoglobin A1c  . Lipid panel  . Microalbumin / creatinine urine ratio  . Comprehensive metabolic panel  . Vitamin B12  . VITAMIN D 25 Hydroxy (Vit-D Deficiency, Fractures)  . Ambulatory referral to Dermatology

## 2018-04-19 NOTE — Patient Instructions (Addendum)
STOP taking the Xultophy Use plain Lantus instead Keep up the good work Ask Dr. Allen Norris how much Tylenol (acetaminophen) is safe for you to take

## 2018-04-19 NOTE — Assessment & Plan Note (Signed)
Check vit D level. 

## 2018-04-19 NOTE — Assessment & Plan Note (Signed)
Check labs 

## 2018-04-26 DIAGNOSIS — E559 Vitamin D deficiency, unspecified: Secondary | ICD-10-CM | POA: Diagnosis not present

## 2018-04-26 DIAGNOSIS — E1165 Type 2 diabetes mellitus with hyperglycemia: Secondary | ICD-10-CM | POA: Diagnosis not present

## 2018-04-26 DIAGNOSIS — R809 Proteinuria, unspecified: Secondary | ICD-10-CM | POA: Diagnosis not present

## 2018-04-26 DIAGNOSIS — I700 Atherosclerosis of aorta: Secondary | ICD-10-CM | POA: Diagnosis not present

## 2018-04-26 DIAGNOSIS — I6529 Occlusion and stenosis of unspecified carotid artery: Secondary | ICD-10-CM | POA: Diagnosis not present

## 2018-04-26 DIAGNOSIS — K7469 Other cirrhosis of liver: Secondary | ICD-10-CM | POA: Diagnosis not present

## 2018-04-26 DIAGNOSIS — E538 Deficiency of other specified B group vitamins: Secondary | ICD-10-CM | POA: Diagnosis not present

## 2018-04-26 DIAGNOSIS — E1142 Type 2 diabetes mellitus with diabetic polyneuropathy: Secondary | ICD-10-CM | POA: Diagnosis not present

## 2018-04-27 LAB — HEMOGLOBIN A1C
Est. average glucose Bld gHb Est-mCnc: 134 mg/dL
Hgb A1c MFr Bld: 6.3 % — ABNORMAL HIGH (ref 4.8–5.6)

## 2018-04-27 LAB — COMPREHENSIVE METABOLIC PANEL
ALT: 15 IU/L (ref 0–32)
AST: 25 IU/L (ref 0–40)
Albumin/Globulin Ratio: 1.3 (ref 1.2–2.2)
Albumin: 4 g/dL (ref 3.5–4.8)
Alkaline Phosphatase: 63 IU/L (ref 39–117)
BUN/Creatinine Ratio: 18 (ref 12–28)
BUN: 19 mg/dL (ref 8–27)
Bilirubin Total: 0.4 mg/dL (ref 0.0–1.2)
CO2: 20 mmol/L (ref 20–29)
Calcium: 9.7 mg/dL (ref 8.7–10.3)
Chloride: 103 mmol/L (ref 96–106)
Creatinine, Ser: 1.04 mg/dL — ABNORMAL HIGH (ref 0.57–1.00)
GFR calc Af Amer: 62 mL/min/{1.73_m2} (ref >59)
GFR calc non Af Amer: 54 mL/min/{1.73_m2} — ABNORMAL LOW (ref >59)
Globulin, Total: 3.2 g/dL (ref 1.5–4.5)
Glucose: 207 mg/dL — ABNORMAL HIGH (ref 65–99)
Potassium: 4.5 mmol/L (ref 3.5–5.2)
Sodium: 142 mmol/L (ref 134–144)
Total Protein: 7.2 g/dL (ref 6.0–8.5)

## 2018-04-27 LAB — LIPID PANEL
Chol/HDL Ratio: 3.7 ratio (ref 0.0–4.4)
Cholesterol, Total: 154 mg/dL (ref 100–199)
HDL: 42 mg/dL (ref >39)
LDL Calculated: 70 mg/dL (ref 0–99)
Triglycerides: 211 mg/dL — ABNORMAL HIGH (ref 0–149)
VLDL Cholesterol Cal: 42 mg/dL — ABNORMAL HIGH (ref 5–40)

## 2018-04-27 LAB — MICROALBUMIN / CREATININE URINE RATIO
Creatinine, Urine: 108.4 mg/dL
Microalb/Creat Ratio: 16.8 mg/g creat (ref 0.0–30.0)
Microalbumin, Urine: 18.2 ug/mL

## 2018-04-27 LAB — VITAMIN B12: Vitamin B-12: 341 pg/mL (ref 232–1245)

## 2018-04-27 LAB — VITAMIN D 25 HYDROXY (VIT D DEFICIENCY, FRACTURES): Vit D, 25-Hydroxy: 28.1 ng/mL — ABNORMAL LOW (ref 30.0–100.0)

## 2018-05-03 ENCOUNTER — Telehealth: Payer: Self-pay | Admitting: Family Medicine

## 2018-05-03 DIAGNOSIS — J3089 Other allergic rhinitis: Secondary | ICD-10-CM

## 2018-05-03 MED ORDER — GABAPENTIN 300 MG PO CAPS: 300.0000 mg | ORAL_CAPSULE | Freq: Every day | ORAL | 5 refills | Status: DC

## 2018-05-03 MED ORDER — FLUTICASONE PROPIONATE 50 MCG/ACT NA SUSP: 2.0000 | Freq: Every day | NASAL | 11 refills | Status: DC

## 2018-05-03 NOTE — Telephone Encounter (Signed)
I sent both prescriptions as requested

## 2018-05-03 NOTE — Telephone Encounter (Signed)
Copied from [place] [id number]. Topic: Quick Communication - Rx Refill/Question >> May 03, 2018  1:00 PM Margot Ables wrote: Medication: gabapentin (NEURONTIN) 300 MG capsule - pt has a few days left - takes 1/night - thought it was being sent 10/22 OV fluticasone (FLONASE) 50 MCG/ACT nasal spray - pt out - hers is expired - tried OTC but it wasn't as good - thought it was being sent 10/22 OV  Has the patient contacted their pharmacy? yes - no order on file Preferred Pharmacy (with phone number or street name): [facility] [place], [place] [phone] (Phone) [phone] (Fax)

## 2018-05-05 ENCOUNTER — Other Ambulatory Visit: Payer: Self-pay | Admitting: Podiatry

## 2018-05-05 ENCOUNTER — Ambulatory Visit: Payer: PPO | Admitting: Podiatry

## 2018-05-05 ENCOUNTER — Encounter: Payer: Self-pay | Admitting: Podiatry

## 2018-05-05 ENCOUNTER — Ambulatory Visit (INDEPENDENT_AMBULATORY_CARE_PROVIDER_SITE_OTHER): Payer: PPO

## 2018-05-05 VITALS — BP 120/66 | HR 74

## 2018-05-05 DIAGNOSIS — M79675 Pain in left toe(s): Secondary | ICD-10-CM | POA: Diagnosis not present

## 2018-05-05 DIAGNOSIS — B351 Tinea unguium: Secondary | ICD-10-CM

## 2018-05-05 DIAGNOSIS — M722 Plantar fascial fibromatosis: Secondary | ICD-10-CM

## 2018-05-05 DIAGNOSIS — M778 Other enthesopathies, not elsewhere classified: Secondary | ICD-10-CM

## 2018-05-05 DIAGNOSIS — M779 Enthesopathy, unspecified: Secondary | ICD-10-CM

## 2018-05-05 DIAGNOSIS — M79674 Pain in right toe(s): Secondary | ICD-10-CM | POA: Diagnosis not present

## 2018-05-05 DIAGNOSIS — M2041 Other hammer toe(s) (acquired), right foot: Secondary | ICD-10-CM

## 2018-05-05 MED ORDER — MELOXICAM 7.5 MG PO TABS: 7.5000 mg | ORAL_TABLET | Freq: Every day | ORAL | 0 refills | Status: DC

## 2018-05-05 NOTE — Progress Notes (Signed)
This patient presents to the office with chief complaint of a red swollen area on the top of her right foot.  She states that she believes that she had her right foot on the door frame in the bathroom.  This patient is diabetic with neuropathy and she was not aware that the area was injured.  She says that she was seen by Dr. Sanda Klein on October 22 and told to make an appointment with this office for an evaluation and treatment. . She states that since she was injured she has been experiencing redness swelling and pain on the top of her right foot.  She has not provided any self treatment nor sought any professional help.  She presents the office today for an evaluation and treatment of this painful condition right foot  Vascular  Dorsalis pedis and posterior tibial pulses are palpable  B/L.  Capillary return  WNL.  Temperature gradient is  WNL.  Skin turgor  WNL  Sensorium  Senn Weinstein monofilament wire absent. Absent  tactile sensation.  Nail Exam  Patient has normal nails with no evidence of bacterial or fungal infection with the exception second toenail right foot..  The second toenail is thick disfigured and discolored.  Orthopedic  Exam  Muscle tone and muscle strength  WNL.  No limitations of motion feet  B/L.  No crepitus or joint effusion noted.  Foot type is unremarkable and digits show no abnormalities.  Palpable pain noted that the level of the second MPJ right foot.  Patient has redness swelling and pain over the second MPJ extending proximally  over the second metatarsal.  Skin  No open lesions.  Normal skin texture and turgor.  Capsulitis 2nd MPJ right foot.  Hammer toe  Right foot  Possible flexor plate injury  IE x-rays revealed no evidence of bony pathology right forefoot.  Discussed this condition with and with this patient.  I believe she has traumatically injured her second MPJ right foot.  I believe she is developing a flexor plate injury second MPJ.  I recommended that we treat  her with a surgical shoe Mobic by mouth and buddy taping her toes.  She is to return to the office in 3 weeks at which time she will be reevaluated and further treated.   Gardiner Barefoot DPM

## 2018-05-17 ENCOUNTER — Ambulatory Visit: Payer: PPO | Admitting: Gastroenterology

## 2018-05-31 ENCOUNTER — Other Ambulatory Visit: Payer: Self-pay | Admitting: Family Medicine

## 2018-05-31 ENCOUNTER — Ambulatory Visit (INDEPENDENT_AMBULATORY_CARE_PROVIDER_SITE_OTHER): Payer: PPO | Admitting: Nurse Practitioner

## 2018-05-31 ENCOUNTER — Encounter: Payer: Self-pay | Admitting: Nurse Practitioner

## 2018-05-31 VITALS — BP 130/60 | HR 95 | Temp 98.0°F | Resp 16 | Ht 64.0 in | Wt 161.9 lb

## 2018-05-31 DIAGNOSIS — R059 Cough, unspecified: Secondary | ICD-10-CM

## 2018-05-31 DIAGNOSIS — J3489 Other specified disorders of nose and nasal sinuses: Secondary | ICD-10-CM

## 2018-05-31 DIAGNOSIS — R05 Cough: Secondary | ICD-10-CM

## 2018-05-31 DIAGNOSIS — E038 Other specified hypothyroidism: Secondary | ICD-10-CM

## 2018-05-31 MED ORDER — BENZONATATE 100 MG PO CAPS: 100.0000 mg | ORAL_CAPSULE | Freq: Two times a day (BID) | ORAL | 0 refills | Status: DC | PRN

## 2018-05-31 NOTE — Patient Instructions (Addendum)
-   Please take cough pills twice a day for coughing  - Continue taking musinex (guaifenesin) twice a da, zyrtec daily  - Call us back in 2-3 days if your symptoms are not improving, at any time if they worsen please let us know right away.   You likely have a viral upper respiratory infection (URI). Antibiotics will not reduce the number of days you are ill or prevent you from getting bacterial rhinosinusitis. A URI can take up to 14 days to resolve, but typically last between 7-14 days. Your body is so smart and strong that it will be fighting this illness off for you but it is important that you drink plenty of fluids, rest. Cover your nose/mouth when you cough or sneeze and wash your hands well and often. Here are some helpful things you can use or pick up over the counter from the pharmacy to help with your symptoms:   For Fever/Pain: Acetaminophen every 6 hours as needed (maximum of 3000mg  a day). If you are still uncomfortable you can add ibuprofen OR naproxen  For coughing: try dextromethorphan for a cough suppressant, and/or a cool mist humidifier, lozenges  For sore throat: saline gargles, honey herbal tea, lozenges, throat spray  To dry out your nose: try an antihistamine like loratadine (non-sedating) or diphenhydramine (sedating) or others To relieve a stuffy nose: try an oral decongestant  Like pseudoephedrine if you are under the age of 88 and do not have high blood pressure, neti pot To make blowing your nose easier: guaifenesin

## 2018-05-31 NOTE — Progress Notes (Signed)
Name: Charlotte Herrera   MRN: [id number]    DOB: [birth year]   Date:05/31/2018       Progress Note  Subjective  Chief Complaint  Chief Complaint  Patient presents with  . Flu like symptoms    ear pressure, nasal congestion, cough, aches.    HPI  Patient endorses nasal congestion, cough, bilateral ear pain, sore throat ongoing for 8 days, subjective fevers and chills. Feels mild improvement. No shortness of breath, chest pain.   Has using musinex, zyrtec, cough drops with mild relief of symptoms.    Patient Active Problem List   Diagnosis Date Noted  . Abnormal weight loss   . Diarrhea   . Gastritis without bleeding   . Carotid stenosis 01/04/2018  . Pain in limb 01/04/2018  . Microalbuminuria 12/14/2017  . 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 Need for hepatitis A and B vaccination 10/12/2017  . Anxiety, generalized 09/03/2017  . Depression 09/03/2017  . Calcification of abdominal aorta (HCC) 09/03/2017  . Cirrhosis ([place]) 09/03/2017  . Hx of transient ischemic attack (TIA) 07/06/2017  . H/O carotid endarterectomy 05/28/2017  . Plantar fasciitis 11/27/2016  . Transaminitis 03/11/2016  . Environmental and seasonal allergies 12/05/2015  . Lipoma of abdominal wall 12/05/2015  . Dark urine 12/05/2015  . Abdominal mass, left lower quadrant 11/05/2015  . Heel pain 08/05/2015  . Osteoarthritis of knee 08/05/2015  . Chronic gout involving toe of right foot without tophus 05/28/2015  . Hypothyroidism 05/14/2015  . Pain and swelling of toe of left foot 04/04/2015  . Impingement syndrome of shoulder region 01/17/2015  . Lateral epicondylitis 01/17/2015  . Bilateral chronic knee pain 01/15/2015  . Vitamin B12 deficiency 12/25/2014  . Vitamin D deficiency 12/25/2014  . Uncontrolled type 2 diabetes mellitus with peripheral neuropathy ([place]) 12/25/2014  . [place] intertrigo 12/25/2014  . Hx of transient ischemic attack (TIA) 12/25/2014   . Abnormal liver enzymes 12/25/2014    Past Medical History:  Diagnosis Date  . Arthritis    hands, shoulders, knees, feet  . Calcification of abdominal aorta (HCC) 09/03/2017   CT scan [facility] Feb 2019  . Cirrhosis ([place]) 09/03/2017   Chest CT [facility] Feb 2019  . Depression   . Diabetes mellitus, type 2 ([place])   . Enlarged liver   . Family history of adverse reaction to anesthesia    Mother - PONV  . Fibromyalgia   . Fibromyalgia affecting multiple sites    pinched nerve in back of neck  . GERD (gastroesophageal reflux disease)   . Headache   . Hyperlipidemia   . Hypertension   . Hypothyroidism   . Motion sickness   . PONV (postoperative nausea and vomiting)   . Stroke ([place])   . Thyroid disease   . TIA (transient ischemic attack)   . Vertigo    after cataract surgery    Past Surgical History:  Procedure Laterality Date  . ABDOMINAL HYSTERECTOMY    . CARPAL TUNNEL RELEASE Left   . COLONOSCOPY  2015  . COLONOSCOPY WITH PROPOFOL N/A 11/05/2017   Procedure: COLONOSCOPY WITH PROPOFOL;  Surgeon: Lucilla Lame, MD;  Location: [place];  Service: Endoscopy;  Laterality: N/A;  Diabetic  . COLONOSCOPY WITH PROPOFOL N/A 02/25/2018   Procedure: COLONOSCOPY WITH PROPOFOL;  Surgeon: Lucilla Lame, MD;  Location: [place];  Service: Endoscopy;  Laterality: N/A;  . ESOPHAGOGASTRODUODENOSCOPY (EGD) WITH PROPOFOL N/A 11/05/2017   Procedure: ESOPHAGOGASTRODUODENOSCOPY (EGD) WITH PROPOFOL;  Surgeon: Lucilla Lame, MD;  Location: [place];  Service: Endoscopy;  Laterality: N/A;  . ESOPHAGOGASTRODUODENOSCOPY (EGD) WITH PROPOFOL N/A 02/25/2018   Procedure: ESOPHAGOGASTRODUODENOSCOPY (EGD) WITH PROPOFOL;  Surgeon: Lucilla Lame, MD;  Location: [place];  Service: Endoscopy;  Laterality: N/A;  diabetic - insulin and oral meds  . SPINE SURGERY    . VARICOSE VEIN SURGERY Left 1975    Social History   Tobacco Use  . Smoking status: Never Smoker  . Smokeless tobacco:  Never Used  . Tobacco comment: smoking cessation materials not required  Substance Use Topics  . Alcohol use: No     Current Outpatient Medications:  .  b complex vitamins tablet, Take 1 tablet by mouth daily., Disp: , Rfl:  .  Blood Glucose Monitoring Suppl (ONE TOUCH ULTRA MINI) W/DEVICE KIT, 1 Device by Does not apply route 2 (two) times daily., Disp: 1 each, Rfl: 0 .  cetirizine (ZYRTEC) 10 MG tablet, Take 10 mg by mouth daily as needed for allergies., Disp: , Rfl:  .  clopidogrel (PLAVIX) 75 MG tablet, Take 75 mg by mouth daily., Disp: , Rfl:  .  fluticasone (FLONASE) 50 MCG/ACT nasal spray, Place 2 sprays into both nostrils daily., Disp: 16 g, Rfl: 11 .  gabapentin (NEURONTIN) 300 MG capsule, Take 1 capsule (300 mg total) by mouth at bedtime., Disp: 30 capsule, Rfl: 5 .  glucose blood (ONE TOUCH ULTRA TEST) test strip, TEST twice a day, Disp: 180 each, Rfl: 3 .  Insulin Glargine (LANTUS SOLOSTAR) 100 UNIT/ML Solostar Pen, Inject 30 Units into the skin at bedtime., Disp: 5 pen, Rfl: 11 .  Insulin Pen Needle (B-D UF III MINI PEN NEEDLES) 31G X 5 MM MISC, USE AS DIRECTED AT BEDTIME DX:E11.42 Lon:99 months, Disp: 100 each, Rfl: 1 .  ketoconazole (NIZORAL) 2 % cream, Apply 1 application topically daily as needed for irritation. Where needed, Disp: 60 g, Rfl: 2 .  levothyroxine (SYNTHROID, LEVOTHROID) 50 MCG tablet, TAKE 1 TABLET(50 MCG) BY MOUTH DAILY BEFORE BREAKFAST, Disp: 90 tablet, Rfl: 0 .  losartan (COZAAR) 50 MG tablet, Take 1 tablet (50 mg total) by mouth daily., Disp: 90 tablet, Rfl: 3 .  metFORMIN (GLUCOPHAGE) 500 MG tablet, Take 1 tablet (500 mg total) by mouth daily with breakfast., Disp: 30 tablet, Rfl: 5 .  ONETOUCH DELICA LANCETS 40J MISC, 33 g by Does not apply route 2 (two) times daily. Test twice a day as directed, Disp: 100 each, Rfl: 2 .  pantoprazole (PROTONIX) 20 MG tablet, Take 1 tablet (20 mg total) by mouth daily. (for heartburn, reflux), Disp: 90 tablet, Rfl: 0 .   pravastatin (PRAVACHOL) 20 MG tablet, Take 1 tablet (20 mg total) by mouth at bedtime. For cholesterol, Disp: 90 tablet, Rfl: 3 .  dexlansoprazole (DEXILANT) 60 MG capsule, Dexilant 60 mg capsule, delayed release, Disp: , Rfl:  .  meloxicam (MOBIC) 7.5 MG tablet, TAKE 1 TABLET(7.5 MG) BY MOUTH DAILY (Patient not taking: Reported on 05/31/2018), Disp: 90 tablet, Rfl: 0 .  traZODone (DESYREL) 50 MG tablet, Take 0.5-1 tablets (25-50 mg total) by mouth at bedtime as needed for sleep. (Patient not taking: Reported on 05/31/2018), Disp: 30 tablet, Rfl: 5  Allergies  Allergen Reactions  . Aspirin   . Duloxetine Hcl   . Penicillins   . Codeine Nausea And Vomiting and Rash  . Lyrica [Pregabalin] Nausea And Vomiting  . Sulfa Antibiotics Swelling    ROS   No other specific complaints in a  complete review of systems (except as listed in HPI above).  Objective  Vitals:   05/31/18 1402  BP: 130/60  Pulse: 95  Resp: 16  Temp: 98 F (36.7 C)  TempSrc: Oral  SpO2: 98%  Weight: 161 lb 14.4 oz (73.4 kg)  Height: 5' 4"  (1.626 m)    Body mass index is 27.79 kg/m.  Nursing Note and Vital Signs reviewed.  Physical Exam  HENT:  Head: Normocephalic and atraumatic.  Right Ear: External ear normal.  Left Ear: External ear normal.  Nose: Mucosal edema and sinus tenderness present. No rhinorrhea or nose lacerations. Right sinus exhibits frontal sinus tenderness. Right sinus exhibits no maxillary sinus tenderness. Left sinus exhibits frontal sinus tenderness. Left sinus exhibits no maxillary sinus tenderness.  Mouth/Throat: Uvula is midline and mucous membranes are normal. Posterior oropharyngeal erythema present. No oropharyngeal exudate or posterior oropharyngeal edema.  Eyes: Conjunctivae are normal. Right eye exhibits no discharge. Left eye exhibits no discharge.  Neck: Normal range of motion.  Cardiovascular: Normal rate and regular rhythm.  Pulmonary/Chest: Effort normal and breath sounds  normal. She has no wheezes.  Abdominal: Soft. There is no tenderness.  Lymphadenopathy:    She has no cervical adenopathy.  Neurological: She is alert.  Skin: Skin is warm and dry. No rash noted.  Psychiatric: Judgment normal.     No results found for this or any previous visit (from the past 48 hour(s)).  Assessment & Plan  1. Cough - benzonatate (TESSALON PERLES) 100 MG capsule; Take 1 capsule (100 mg total) by mouth 2 (two) times daily as needed for cough.  Dispense: 30 capsule; Refill: 0  2. Sinus pressure Likely URI discussed OTC management; if worsening or unimproved in 2 days consider starting antibiotic, patient will call.   - Please take cough pills twice a day for coughing  - Continue taking musinex (guaifenesin) twice a day, zyrtec daily  - Call us back in 2-3 days if your symptoms are not improving, at any time if they worsen please let us know right away.

## 2018-06-01 NOTE — Telephone Encounter (Signed)
Lab Results  Component Value Date   TSH 1.04 08/03/2017

## 2018-06-02 ENCOUNTER — Ambulatory Visit: Payer: PPO | Admitting: Podiatry

## 2018-06-02 ENCOUNTER — Telehealth: Payer: Self-pay | Admitting: Family Medicine

## 2018-06-02 MED ORDER — AZITHROMYCIN 250 MG PO TABS: ORAL_TABLET | ORAL | 0 refills | Status: DC

## 2018-06-02 NOTE — Telephone Encounter (Signed)
Copied from [place] [id number]. Topic: General - Other >> Jun 02, 2018  7:35 AM [name] wrote: Reason for CRM: Patient called and stated that Dr. Sanda Klein said that if she does not feel any better soon, she should call to see if the doctor could call her in some medication.  Patient's voice is almost gone and she is still coughing quite a bit.  Please advise and call patient back

## 2018-06-02 NOTE — Telephone Encounter (Signed)
Rx sent; we hope she feels better soon

## 2018-07-07 ENCOUNTER — Ambulatory Visit (INDEPENDENT_AMBULATORY_CARE_PROVIDER_SITE_OTHER): Payer: PPO

## 2018-07-07 ENCOUNTER — Other Ambulatory Visit: Payer: Self-pay

## 2018-07-07 VITALS — BP 128/68 | HR 90 | Temp 97.9°F | Resp 16 | Ht 64.0 in | Wt 163.7 lb

## 2018-07-07 DIAGNOSIS — Z0000 Encounter for general adult medical examination without abnormal findings: Secondary | ICD-10-CM

## 2018-07-07 MED ORDER — INSULIN PEN NEEDLE 32G X 6 MM MISC: 3 refills | Status: AC

## 2018-07-07 NOTE — Telephone Encounter (Signed)
Pt seen in office today for AWV. She is requesting new rx for pen needles. Current pen needles are 31 G and she c/o bleeding and bruising with these. She had a sample of Novofine plus 32G pen needles and would like those sent to [facility] for use with lantus. Thank you!

## 2018-07-07 NOTE — Addendum Note (Signed)
Addended by: Ahyan Kreeger, Satira Anis on: 07/07/2018 02:20 PM   Modules accepted: Orders

## 2018-07-07 NOTE — Progress Notes (Addendum)
Subjective:   Charlotte Herrera is a 73 y.o. female who presents for Medicare Annual (Subsequent) preventive examination.  Review of Systems:  Cardiac Risk Factors include: advanced age (>67mn, >>41women);diabetes mellitus;dyslipidemia;hypertension     Objective:     Vitals: BP 128/68 (BP Location: Right Arm, Patient Position: Sitting, Cuff Size: Normal)   Pulse 90   Temp 97.9 F (36.6 C) (Oral)   Resp 16   Ht 5' 4"  (1.626 m)   Wt 163 lb 11.2 oz (74.3 kg)   SpO2 98%   BMI 28.10 kg/m   Body mass index is 28.1 kg/m.  Advanced Directives 07/07/2018 02/25/2018 11/05/2017 07/06/2017 04/06/2017 01/26/2017 12/29/2016  Does Patient Have a Medical Advance Directive? No Yes Yes No No No No  Type of Advance Directive - HTuttleLiving will HDwightLiving will - - - -  Does patient want to make changes to medical advance directive? - No - Patient declined - - - - -  Copy of [place] Chart? - Yes Yes - - - -  Would patient like information on creating a medical advance directive? Yes (MAU/Ambulatory/Procedural Areas - Information given) - - Yes (MAU/Ambulatory/Procedural Areas - Information given) - - -    Tobacco Social History   Tobacco Use  Smoking Status Never Smoker  Smokeless Tobacco Never Used  Tobacco Comment   smoking cessation materials not required     Counseling given: Not Answered Comment: smoking cessation materials not required   Clinical Intake:  Pre-visit preparation completed: Yes  Pain : 0-10 Pain Score: 7  Pain Type: Neuropathic pain Pain Location: Foot Pain Orientation: Right, Left Pain Descriptors / Indicators: Burning, Numbness, Tingling Pain Onset: More than a month ago Pain Frequency: Constant     Nutritional Status: BMI 25 -29 Overweight Nutritional Risks: None Diabetes: Yes CBG done?: No Did pt. bring in CBG monitor from home?: No   Nutrition Risk Assessment:  Has the patient had any  N/V/D within the last 2 months?  Yes  occaisonal diarrhea and constipation Does the patient have any non-healing wounds?  No  Has the patient had any unintentional weight loss or weight gain?  No   Diabetes:  Is the patient diabetic?  Yes  If diabetic, was a CBG obtained today?  No  Did the patient bring in their glucometer from home?  No  How often do you monitor your CBG's? daily.   Financial Strains and Diabetes Management:  Are you having any financial strains with the device, your supplies or your medication? No .  Does the patient want to be seen by Chronic Care Management for management of their diabetes?  No  Would the patient like to be referred to a Nutritionist or for Diabetic Management?  No   Diabetic Exams:  Diabetic Eye Exam: Completed per pt by Dr. BGloriann Loanlast year. Requesting records.   Diabetic Foot Exam: Completed 04/19/18.   How often do you need to have someone help you when you read instructions, pamphlets, or other written materials from your doctor or pharmacy?: 1 - Never What is the last grade level you completed in school?: some college  Interpreter Needed?: No  Information entered by :: KClemetine MarkerLPN  Past Medical History:  Diagnosis Date  . Allergy   . Arthritis    hands, shoulders, knees, feet  . Calcification of abdominal aorta (HCC) 09/03/2017   [facility]  . Cirrhosis ([place] 09/03/2017  Chest [facility] Feb 2019  . Depression   . Diabetes mellitus, type 2 ([place])   . Enlarged liver   . Family history of adverse reaction to anesthesia    Mother - PONV  . Fibromyalgia   . Fibromyalgia affecting multiple sites    pinched nerve in back of neck  . GERD (gastroesophageal reflux disease)   . Headache   . Hyperlipidemia   . Hypertension   . Hypothyroidism   . Motion sickness   . PONV (postoperative nausea and vomiting)   . Stroke ([place])   . Thyroid disease   . TIA (transient ischemic attack)   . Vertigo    after cataract surgery    Past Surgical History:  Procedure Laterality Date  . ABDOMINAL HYSTERECTOMY    . CARPAL TUNNEL RELEASE Left   . COLONOSCOPY  2015  . COLONOSCOPY WITH PROPOFOL N/A 11/05/2017   Procedure: COLONOSCOPY WITH PROPOFOL;  Surgeon: Lucilla Lame, MD;  Location: [place];  Service: Endoscopy;  Laterality: N/A;  Diabetic  . COLONOSCOPY WITH PROPOFOL N/A 02/25/2018   Procedure: COLONOSCOPY WITH PROPOFOL;  Surgeon: Lucilla Lame, MD;  Location: [place];  Service: Endoscopy;  Laterality: N/A;  . ESOPHAGOGASTRODUODENOSCOPY (EGD) WITH PROPOFOL N/A 11/05/2017   Procedure: ESOPHAGOGASTRODUODENOSCOPY (EGD) WITH PROPOFOL;  Surgeon: Lucilla Lame, MD;  Location: [place];  Service: Endoscopy;  Laterality: N/A;  . ESOPHAGOGASTRODUODENOSCOPY (EGD) WITH PROPOFOL N/A 02/25/2018   Procedure: ESOPHAGOGASTRODUODENOSCOPY (EGD) WITH PROPOFOL;  Surgeon: Lucilla Lame, MD;  Location: [place];  Service: Endoscopy;  Laterality: N/A;  diabetic - insulin and oral meds  . SPINE SURGERY    . VARICOSE VEIN SURGERY Left 1975   Family History  Problem Relation Age of Onset  . Cancer Mother   . Ovarian cancer Mother   . Diabetes Father   . Cancer Father   . Prostate cancer Father   . Heart disease Father   . Diabetes Brother   . Cancer Brother   . Breast cancer Maternal Aunt        40's  . Heart disease Daughter   . Healthy Daughter   . Healthy Daughter   . Ovarian cancer Maternal Aunt   . Liver cancer Maternal Grandmother   . Heart failure Maternal Grandfather    Social History   Socioeconomic History  . Marital status: Divorced    Spouse name: Not on file  . Number of children: 3  . Years of education: some college  . Highest education level: GED or equivalent  Occupational History  . Occupation: Surveyor, mining:  [employer]  Social Needs  . Financial resource strain: Somewhat hard  . Food insecurity:    Worry: Sometimes true    Inability: Never true   . Transportation needs:    Medical: No    Non-medical: No  Tobacco Use  . Smoking status: Never Smoker  . Smokeless tobacco: Never Used  . Tobacco comment: smoking cessation materials not required  Substance and Sexual Activity  . Alcohol use: No  . Drug use: No  . Sexual activity: Not Currently  Lifestyle  . Physical activity:    Days per week: 3 days    Minutes per session: 10 min  . Stress: Very much  Relationships  . Social connections:    Talks on phone: More than three times a week    Gets together: Once a week    Attends religious service: Never    Active  member of club or organization: No    Attends meetings of clubs or organizations: Never    Relationship status: Divorced  Other Topics Concern  . Not on file  Social History Narrative  . Not on file    Outpatient Encounter Medications as of 07/07/2018  Medication Sig  . Blood Glucose Monitoring Suppl (ONE TOUCH ULTRA MINI) W/DEVICE KIT 1 Device by Does not apply route 2 (two) times daily.  . cetirizine (ZYRTEC) 10 MG tablet Take 10 mg by mouth daily as needed for allergies.  [name] Kitchen clopidogrel (PLAVIX) 75 MG tablet Take 75 mg by mouth daily.  . fluticasone (FLONASE) 50 MCG/ACT nasal spray Place 2 sprays into both nostrils daily.  [name] Kitchen gabapentin (NEURONTIN) 300 MG capsule Take 1 capsule (300 mg total) by mouth at bedtime.  [name] Kitchen glucose blood (ONE TOUCH ULTRA TEST) test strip TEST twice a day  . Insulin Glargine (LANTUS SOLOSTAR) 100 UNIT/ML Solostar Pen Inject 30 Units into the skin at bedtime.  . Insulin Pen Needle (B-D UF III MINI PEN NEEDLES) 31G X 5 MM MISC USE AS DIRECTED AT BEDTIME DX:E11.42 Lon:99 months  . ketoconazole (NIZORAL) 2 % cream Apply 1 application topically daily as needed for irritation. Where needed  . levothyroxine (SYNTHROID, LEVOTHROID) 50 MCG tablet TAKE 1 TABLET(50 MCG) BY MOUTH DAILY BEFORE BREAKFAST  . losartan (COZAAR) 50 MG tablet Take 1 tablet (50 mg total) by mouth daily.  . metFORMIN  (GLUCOPHAGE) 500 MG tablet Take 1 tablet (500 mg total) by mouth daily with breakfast.  . ONETOUCH DELICA LANCETS 67Y MISC 33 g by Does not apply route 2 (two) times daily. Test twice a day as directed  . pantoprazole (PROTONIX) 20 MG tablet Take 1 tablet (20 mg total) by mouth daily. (for heartburn, reflux)  . pravastatin (PRAVACHOL) 20 MG tablet Take 1 tablet (20 mg total) by mouth at bedtime. For cholesterol  . [DISCONTINUED] azithromycin (ZITHROMAX) 250 MG tablet Two by mouth on day one, then one daily for four more days  . [DISCONTINUED] b complex vitamins tablet Take 1 tablet by mouth daily.  . [DISCONTINUED] benzonatate (TESSALON PERLES) 100 MG capsule Take 1 capsule (100 mg total) by mouth 2 (two) times daily as needed for cough.  . [DISCONTINUED] dexlansoprazole (DEXILANT) 60 MG capsule Dexilant 60 mg capsule, delayed release  . [DISCONTINUED] traZODone (DESYREL) 50 MG tablet Take 0.5-1 tablets (25-50 mg total) by mouth at bedtime as needed for sleep. (Patient not taking: Reported on 05/31/2018)   No facility-administered encounter medications on file as of 07/07/2018.     Activities of Daily Living In your present state of health, do you have any difficulty performing the following activities: 07/07/2018 04/19/2018  Hearing? N N  Vision? N N  Comment reading glasses -  Difficulty concentrating or making decisions? N N  Walking or climbing stairs? N N  Dressing or bathing? N N  Doing errands, shopping? N N  Preparing Food and eating ? N -  Using the Toilet? N -  In the past six months, have you accidently leaked urine? N -  Do you have problems with loss of bowel control? N -  Managing your Medications? N -  Managing your Finances? N -  Housekeeping or managing your Housekeeping? N -  Some recent data might be hidden    Patient Care Team: Lada, Satira Anis, MD as PCP - General (Family Medicine) Marjean Donna, MD as Consulting Physician (Family Medicine)    Assessment:   This  is  a routine wellness examination for Jerianne.  Exercise Activities and Dietary recommendations Current Exercise Habits: Structured exercise class, Type of exercise: strength training/weights, Time (Minutes): 10, Frequency (Times/Week): 3, Weekly Exercise (Minutes/Week): 30, Intensity: Mild, Exercise limited by: None identified  Goals    . DIET - INCREASE WATER INTAKE     Recommend to drink at least 6-8 8oz glasses of water per day        Fall Risk Fall Risk  07/07/2018 05/31/2018 04/19/2018 02/01/2018 11/09/2017  Falls in the past year? 0 0 No No No  Comment - - - - -  Number falls in past yr: 0 - - - -  Comment - - - - -  Injury with Fall? - - - - -  Comment - - - - -  Risk Factor Category  - - - - -  Risk for fall due to : - - - - -  Risk for fall due to: Comment - - - - -  Follow up - - - - -   FALL RISK PREVENTION PERTAINING TO THE HOME:  Any stairs in or around the home WITH handrails? Yes  Home free of loose throw rugs in walkways, pet beds, electrical cords, etc? Yes  Adequate lighting in your home to reduce risk of falls? Yes   ASSISTIVE DEVICES UTILIZED TO PREVENT FALLS:  Life alert? No  Use of a cane, walker or w/c? Yes  occasional use Grab bars in the bathroom? No  Shower chair or bench in shower? Yes  Elevated toilet seat or a handicapped toilet? No   DME ORDERS:  DME order needed?  No   TIMED UP AND GO:  Was the test performed? Yes .  Length of time to ambulate 10 feet: 6 sec.   GAIT:  Appearance of gait: Gait stead-fast and without the use of an assistive device.  Education: Fall risk prevention has been discussed.  Intervention(s) required? No   Depression Screen PHQ 2/9 Scores 07/07/2018 02/01/2018 12/14/2017 11/09/2017  PHQ - 2 Score 1 3 1 1   PHQ- 9 Score 8 12 2  -     Cognitive Function     6CIT Screen 11/09/2017 07/06/2017  What Year? 0 points 0 points  What month? 0 points 0 points  What time? 0 points 3 points  Count back from 20 0 points 0 points   Months in reverse 0 points 4 points  Repeat phrase 4 points 4 points  Total Score 4 11    Immunization History  Administered Date(s) Administered  . Influenza, High Dose Seasonal PF 03/11/2016, 04/06/2017, 04/19/2018  . Influenza,inj,Quad PF,6+ Mos 04/16/2015  . Pneumococcal Conjugate-13 07/06/2017    Qualifies for Shingles Vaccine? Yes . Due for Shingrix. Education has been provided regarding the importance of this vaccine. Pt has been advised to call insurance company to determine out of pocket expense. Advised may also receive vaccine at local pharmacy or Health Dept. Verbalized acceptance and understanding.  Tdap: Although this vaccine is not a covered service during a Wellness Exam, does the patient still wish to receive this vaccine today?  No .  Education has been provided regarding the importance of this vaccine. Advised may receive this vaccine at local pharmacy or Health Dept. Aware to provide a copy of the vaccination record if obtained from local pharmacy or Health Dept. Verbalized acceptance and understanding.  Flu Vaccine: Up to date  Pneumococcal Vaccine: Up to date. Per pt she completed pneumonia vaccine at Dr.  Mead's office, [facility] and would like [place] to check for additional records or the immunization registry before completing the series here.     Screening Tests Health Maintenance  Topic Date Due  . OPHTHALMOLOGY EXAM  06/22/1956  . TETANUS/TDAP  06/21/1964  . PNA vac Low Risk Adult (2 of 2 - PPSV23) 07/06/2018  . HEMOGLOBIN A1C  10/26/2018  . MAMMOGRAM  01/05/2019  . FOOT EXAM  04/20/2019  . Fecal DNA (Cologuard)  11/20/2019  . INFLUENZA VACCINE  Completed  . DEXA SCAN  Completed  . Hepatitis C Screening  Completed   Cancer Screenings:  Colorectal Screening: Completed 02/25/18. Repeat every 10 years;   Mammogram: Completed 01/04/18. Repeat every year;   Bone Density: Completed 12/29/16. Results reflect NORMAL, Repeat every 2 years.   Lung  Cancer Screening: (Low Dose CT Chest recommended if Age 10-80 years, 30 pack-year currently smoking OR have quit w/in 15years.) does not qualify.    Additional Screening:  Hepatitis C Screening: does qualify; Completed 11/03/16  Vision Screening: Recommended annual ophthalmology exams for early detection of glaucoma and other disorders of the eye. Is the patient up to date with their annual eye exam?  Yes  Who is the provider or what is the name of the office in which the pt attends annual eye exams? Dr. Gloriann Loan  Dental Screening: Recommended annual dental exams for proper oral hygiene  Community Resource Referral:  CRR required this visit?  No      Plan:     I have personally reviewed and addressed the Medicare Annual Wellness questionnaire and have noted the following in the patient's chart:  A. Medical and social history B. Use of alcohol, tobacco or illicit drugs  C. Current medications and supplements D. Functional ability and status E.  Nutritional status F.  Physical activity G. Advance directives H. List of other physicians I.  Hospitalizations, surgeries, and ER visits in previous 12 months J.  [place] such as hearing and vision if needed, cognitive and depression L. Referrals and appointments   In addition, I have reviewed and discussed with patient certain preventive protocols, quality metrics, and best practice recommendations. A written personalized care plan for preventive services as well as general preventive health recommendations were provided to patient.   Signed,  Clemetine Marker, LPN Nurse Health Advisor   Nurse Notes:Pt seemed anxious during visit today due to recent cut back on her hours with the [employer]. She is applying for other part time jobs to help compensate the loss of hours. She has stopped her trazodone due to feeling it did not help and she is still having difficulty sleeping. The holidays were difficult as well due to her daughter  who passed away 2 years ago. Pt states she had been referred to counseling previously but was unable to afford the copay. Offered referral to C3 team for resources and patient declined at this time.

## 2018-07-07 NOTE — Telephone Encounter (Signed)
Done

## 2018-07-07 NOTE — Patient Instructions (Signed)
Charlotte Herrera , Thank you for taking time to come for your Medicare Wellness Visit. I appreciate your ongoing commitment to your health goals. Please review the following plan we discussed and let me know if I can assist you in the future.   Screening recommendations/referrals: Colonoscopy: done 02/25/18 repeat in 10 years Mammogram: done 01/04/18  Bone Density: done 12/29/16  Recommended yearly ophthalmology/optometry visit for glaucoma screening and checkup Recommended yearly dental visit for hygiene and checkup  Vaccinations: Influenza vaccine: done 04/19/18 Pneumococcal vaccine: done 07/06/17 Tdap vaccine: due - please contact us if you get a cut or scrape Shingles vaccine: Shingrix discussed. Please contact your pharmacy for coverage information.     Advanced directives: Advance directive discussed with you today. I have provided a copy for you to complete at home and have notarized. Once this is complete please bring a copy in to our office so we can scan it into your chart.  Conditions/risks identified: Recommend increasing water intake to 6-8 glasses per day  Next appointment: Please follow up in one year for your Medicare Annual Wellness visit.     Preventive Care 45 Years and Older, Female Preventive care refers to lifestyle choices and visits with your health care provider that can promote health and wellness. What does preventive care include?  A yearly physical exam. This is also called an annual well check.  Dental exams once or twice a year.  Routine eye exams. Ask your health care provider how often you should have your eyes checked.  Personal lifestyle choices, including:  Daily care of your teeth and gums.  Regular physical activity.  Eating a healthy diet.  Avoiding tobacco and drug use.  Limiting alcohol use.  Practicing safe sex.  Taking low-dose aspirin every day.  Taking vitamin and mineral supplements as recommended by your health care provider. What  happens during an annual well check? The services and screenings done by your health care provider during your annual well check will depend on your age, overall health, lifestyle risk factors, and family history of disease. Counseling  Your health care provider may ask you questions about your:  Alcohol use.  Tobacco use.  Drug use.  Emotional well-being.  Home and relationship well-being.  Sexual activity.  Eating habits.  History of falls.  Memory and ability to understand (cognition).  Work and work Statistician.  Reproductive health. Screening  You may have the following tests or measurements:  Height, weight, and BMI.  Blood pressure.  Lipid and cholesterol levels. These may be checked every 5 years, or more frequently if you are over 70 years old.  Skin check.  Lung cancer screening. You may have this screening every year starting at age 54 if you have a 30-pack-year history of smoking and currently smoke or have quit within the past 15 years.  Fecal occult blood test (FOBT) of the stool. You may have this test every year starting at age 52.  Flexible sigmoidoscopy or colonoscopy. You may have a sigmoidoscopy every 5 years or a colonoscopy every 10 years starting at age 75.  Hepatitis C blood test.  Hepatitis B blood test.  Sexually transmitted disease (STD) testing.  Diabetes screening. This is done by checking your blood sugar (glucose) after you have not eaten for a while (fasting). You may have this done every 1-3 years.  Bone density scan. This is done to screen for osteoporosis. You may have this done starting at age 49.  Mammogram. This may be done every  1-2 years. Talk to your health care provider about how often you should have regular mammograms. Talk with your health care provider about your test results, treatment options, and if necessary, the need for more tests. Vaccines  Your health care provider may recommend certain vaccines, such  as:  Influenza vaccine. This is recommended every year.  Tetanus, diphtheria, and acellular pertussis (Tdap, Td) vaccine. You may need a Td booster every 10 years.  Zoster vaccine. You may need this after age 59.  Pneumococcal 13-valent conjugate (PCV13) vaccine. One dose is recommended after age 41.  Pneumococcal polysaccharide (PPSV23) vaccine. One dose is recommended after age 15. Talk to your health care provider about which screenings and vaccines you need and how often you need them. This information is not intended to replace advice given to you by your health care provider. Make sure you discuss any questions you have with your health care provider. Document Released: 07/12/2015 Document Revised: 03/04/2016 Document Reviewed: 04/16/2015 Elsevier Interactive Patient Education  2017 [employer] Prevention in the Home Falls can cause injuries. They can happen to people of all ages. There are many things you can do to make your home safe and to help prevent falls. What can I do on the outside of my home?  Regularly fix the edges of walkways and driveways and fix any cracks.  Remove anything that might make you trip as you walk through a door, such as a raised step or threshold.  Trim any bushes or trees on the path to your home.  Use bright outdoor lighting.  Clear any walking paths of anything that might make someone trip, such as rocks or tools.  Regularly check to see if handrails are loose or broken. Make sure that both sides of any steps have handrails.  Any raised decks and porches should have guardrails on the edges.  Have any leaves, snow, or ice cleared regularly.  Use sand or salt on walking paths during winter.  Clean up any spills in your garage right away. This includes oil or grease spills. What can I do in the bathroom?  Use night lights.  Install grab bars by the toilet and in the tub and shower. Do not use towel bars as grab bars.  Use  non-skid mats or decals in the tub or shower.  If you need to sit down in the shower, use a plastic, non-slip stool.  Keep the floor dry. Clean up any water that spills on the floor as soon as it happens.  Remove soap buildup in the tub or shower regularly.  Attach bath mats securely with double-sided non-slip rug tape.  Do not have throw rugs and other things on the floor that can make you trip. What can I do in the bedroom?  Use night lights.  Make sure that you have a light by your bed that is easy to reach.  Do not use any sheets or blankets that are too big for your bed. They should not hang down onto the floor.  Have a firm chair that has side arms. You can use this for support while you get dressed.  Do not have throw rugs and other things on the floor that can make you trip. What can I do in the kitchen?  Clean up any spills right away.  Avoid walking on wet floors.  Keep items that you use a lot in easy-to-reach places.  If you need to reach something above you, use a strong  step stool that has a grab bar.  Keep electrical cords out of the way.  Do not use floor polish or wax that makes floors slippery. If you must use wax, use non-skid floor wax.  Do not have throw rugs and other things on the floor that can make you trip. What can I do with my stairs?  Do not leave any items on the stairs.  Make sure that there are handrails on both sides of the stairs and use them. Fix handrails that are broken or loose. Make sure that handrails are as [place] as the stairways.  Check any carpeting to make sure that it is firmly attached to the stairs. Fix any carpet that is loose or worn.  Avoid having throw rugs at the top or bottom of the stairs. If you do have throw rugs, attach them to the floor with carpet tape.  Make sure that you have a light switch at the top of the stairs and the bottom of the stairs. If you do not have them, ask someone to add them for you. What  else can I do to help prevent falls?  Wear shoes that:  Do not have high heels.  Have rubber bottoms.  Are comfortable and fit you well.  Are closed at the toe. Do not wear sandals.  If you use a stepladder:  Make sure that it is fully opened. Do not climb a closed stepladder.  Make sure that both sides of the stepladder are locked into place.  Ask someone to hold it for you, if possible.  Clearly mark and make sure that you can see:  Any grab bars or handrails.  First and last steps.  Where the edge of each step is.  Use tools that help you move around (mobility aids) if they are needed. These include:  Canes.  Walkers.  Scooters.  Crutches.  Turn on the lights when you go into a dark area. Replace any light bulbs as soon as they burn out.  Set up your furniture so you have a clear path. Avoid moving your furniture around.  If any of your floors are uneven, fix them.  If there are any pets around you, be aware of where they are.  Review your medicines with your doctor. Some medicines can make you feel dizzy. This can increase your chance of falling. Ask your doctor what other things that you can do to help prevent falls. This information is not intended to replace advice given to you by your health care provider. Make sure you discuss any questions you have with your health care provider. Document Released: 04/11/2009 Document Revised: 11/21/2015 Document Reviewed: 07/20/2014 Elsevier Interactive Patient Education  2017 [employer].

## 2018-07-21 ENCOUNTER — Other Ambulatory Visit: Payer: Self-pay | Admitting: Family Medicine

## 2018-07-21 ENCOUNTER — Other Ambulatory Visit: Payer: Self-pay

## 2018-07-21 DIAGNOSIS — Z5181 Encounter for therapeutic drug level monitoring: Secondary | ICD-10-CM

## 2018-07-21 MED ORDER — CLOPIDOGREL BISULFATE 75 MG PO TABS: 75.0000 mg | ORAL_TABLET | Freq: Every day | ORAL | 0 refills | Status: DC

## 2018-07-21 NOTE — Telephone Encounter (Signed)
Pt.notified

## 2018-07-21 NOTE — Telephone Encounter (Signed)
Reviewed prior labs Last CBC was Feb 2019 She's not due for all of her other regular labs until April 2020 Please ask patient to come by for a CBC in the next week (I've already ordered it) Thank you Plavix approved for 30 days

## 2018-07-21 NOTE — Telephone Encounter (Signed)
Copied from [place] [id number]. Topic: Quick Communication - Rx Refill/Question >> Jul 21, 2018 10:26 AM Leward Quan A wrote: Medication: clopidogrel (PLAVIX) 75 MG tablet   Patient is all out need for today please  Has the patient contacted their pharmacy? Yes.   (Agent: If no, request that the patient contact the pharmacy for the refill.) (Agent: If yes, when and what did the pharmacy advise?)  Preferred Pharmacy (with phone number or street name): [id number] [address], [place] - [address] [place] [phone] (Phone) [phone] (Fax)    Agent: Please be advised that RX refills may take up to 3 business days. We ask that you follow-up with your pharmacy.

## 2018-07-26 ENCOUNTER — Ambulatory Visit (INDEPENDENT_AMBULATORY_CARE_PROVIDER_SITE_OTHER): Payer: PPO | Admitting: Vascular Surgery

## 2018-07-26 ENCOUNTER — Encounter (INDEPENDENT_AMBULATORY_CARE_PROVIDER_SITE_OTHER): Payer: PPO

## 2018-07-26 ENCOUNTER — Other Ambulatory Visit: Payer: Self-pay

## 2018-07-26 DIAGNOSIS — Z5181 Encounter for therapeutic drug level monitoring: Secondary | ICD-10-CM | POA: Diagnosis not present

## 2018-07-27 ENCOUNTER — Other Ambulatory Visit: Payer: Self-pay | Admitting: Family Medicine

## 2018-07-27 LAB — CBC WITH DIFFERENTIAL/PLATELET
Basophils Absolute: 0.1 10*3/uL (ref 0.0–0.2)
Basos: 1 %
EOS (ABSOLUTE): 0.3 10*3/uL (ref 0.0–0.4)
Eos: 5 %
Hematocrit: 40.4 % (ref 34.0–46.6)
Hemoglobin: 13.3 g/dL (ref 11.1–15.9)
Immature Grans (Abs): 0 10*3/uL (ref 0.0–0.1)
Immature Granulocytes: 0 %
Lymphocytes Absolute: 2.4 10*3/uL (ref 0.7–3.1)
Lymphs: 39 %
MCH: 30 pg (ref 26.6–33.0)
MCHC: 32.9 g/dL (ref 31.5–35.7)
MCV: 91 fL (ref 79–97)
Monocytes Absolute: 0.4 10*3/uL (ref 0.1–0.9)
Monocytes: 7 %
Neutrophils Absolute: 3 10*3/uL (ref 1.4–7.0)
Neutrophils: 48 %
Platelets: 159 10*3/uL (ref 150–450)
RBC: 4.44 x10E6/uL (ref 3.77–5.28)
RDW: 12 % (ref 11.7–15.4)
WBC: 6.1 10*3/uL (ref 3.4–10.8)

## 2018-07-27 MED ORDER — CLOPIDOGREL BISULFATE 75 MG PO TABS: 75.0000 mg | ORAL_TABLET | Freq: Every day | ORAL | 3 refills | Status: AC

## 2018-07-27 NOTE — Progress Notes (Signed)
Plavix refilled x 1 year

## 2018-08-03 ENCOUNTER — Telehealth: Payer: Self-pay | Admitting: Family Medicine

## 2018-08-03 DIAGNOSIS — Z598 Other problems related to housing and economic circumstances: Secondary | ICD-10-CM

## 2018-08-03 DIAGNOSIS — Z5181 Encounter for therapeutic drug level monitoring: Secondary | ICD-10-CM

## 2018-08-03 DIAGNOSIS — Z599 Problem related to housing and economic circumstances, unspecified: Secondary | ICD-10-CM

## 2018-08-03 NOTE — Telephone Encounter (Signed)
Pt notified, we do not carry samples of lantus.  She is having a hard time paying for meds.  Per lada we will place referral for julie

## 2018-08-03 NOTE — Telephone Encounter (Signed)
Copied from [place] [id number]. Topic: General - Other >> Aug 03, 2018 11:03 AM Lennox Solders wrote: Reason for CRM: pt is calling and would like samples of lantus solostar

## 2018-08-04 ENCOUNTER — Telehealth: Payer: Self-pay | Admitting: Family Medicine

## 2018-08-04 MED ORDER — INSULIN GLARGINE 100 UNIT/ML SOLOSTAR PEN: 40.0000 [IU] | PEN_INJECTOR | Freq: Every day | SUBCUTANEOUS | 11 refills | Status: AC

## 2018-08-04 MED ORDER — PANTOPRAZOLE SODIUM 20 MG PO TBEC: 20.0000 mg | DELAYED_RELEASE_TABLET | Freq: Every day | ORAL | 0 refills | Status: DC

## 2018-08-04 NOTE — Addendum Note (Signed)
Addended by: Docia Furl on: 08/04/2018 01:12 PM   Modules accepted: Orders

## 2018-08-04 NOTE — Telephone Encounter (Signed)
Copied from [place] [id number]. Topic: Quick Communication - Rx Refill/Question >> Aug 04, 2018 12:22 PM Reyne Dumas L wrote: Medication: pantoprazole (PROTONIX) 20 MG tablet  Has the patient contacted their pharmacy? Yes - states no refills on file (Agent: If no, request that the patient contact the pharmacy for the refill.) (Agent: If yes, when and what did the pharmacy advise?)  Preferred Pharmacy (with phone number or street name): [facility] #[id number] [address], [place] - [address] [phone] (Phone) [phone] (Fax)  Agent: Please be advised that RX refills may take up to 3 business days. We ask that you follow-up with your pharmacy.

## 2018-08-04 NOTE — Telephone Encounter (Signed)
Pt states she can't get this from the drug store because of insurance.  Pt would like this sent in.  States that it is written for her to use 30 unites but pt has been using 40 units so they will not refill.

## 2018-08-04 NOTE — Addendum Note (Signed)
Addended by: Irmgard Rampersaud, Satira Anis on: 08/04/2018 04:06 PM   Modules accepted: Orders

## 2018-08-05 ENCOUNTER — Ambulatory Visit: Payer: Self-pay

## 2018-08-05 DIAGNOSIS — IMO0002 Reserved for concepts with insufficient information to code with codable children: Secondary | ICD-10-CM

## 2018-08-05 DIAGNOSIS — Z598 Other problems related to housing and economic circumstances: Secondary | ICD-10-CM

## 2018-08-05 DIAGNOSIS — E1165 Type 2 diabetes mellitus with hyperglycemia: Secondary | ICD-10-CM

## 2018-08-05 DIAGNOSIS — E1122 Type 2 diabetes mellitus with diabetic chronic kidney disease: Secondary | ICD-10-CM

## 2018-08-05 DIAGNOSIS — E1142 Type 2 diabetes mellitus with diabetic polyneuropathy: Secondary | ICD-10-CM

## 2018-08-05 DIAGNOSIS — I129 Hypertensive chronic kidney disease with stage 1 through stage 4 chronic kidney disease, or unspecified chronic kidney disease: Secondary | ICD-10-CM

## 2018-08-05 DIAGNOSIS — Z599 Problem related to housing and economic circumstances, unspecified: Secondary | ICD-10-CM

## 2018-08-05 NOTE — Patient Instructions (Signed)
1. Thank You for allowing the CCM (Chronic Care Management) Team to assist you with your healthcare goals!! We look forward to meeting you on 08/11/2018 at  2. Please bring ALL medications to your appointment! If you have a blood sugar meter or a blood pressure monitor at home, bring those as well.  3.  Contact the CCM Team if you have any question or need to reschedule your initial visit.  CCM (Chronic Care Management) Team   Trish Fountain RN, BSN Nurse Care Coordinator  [phone]  [name] PharmD  Clinical Pharmacist  [phone]   Charlotte Herrera was given information about Chronic Care Management services today including:  1. CCM service includes personalized support from designated clinical staff supervised by her physician, including individualized plan of care and coordination with other care providers 2. 24/7 contact phone numbers for assistance for urgent and routine care needs. 3. Service will only be billed when office clinical staff spend 20 minutes or more in a month to coordinate care. 4. Only one practitioner may furnish and bill the service in a calendar month. 5. The patient may stop CCM services at any time (effective at the end of the month) by phone call to the office staff. 6. The patient will be responsible for cost sharing (co-pay) of up to 20% of the service fee (after annual deductible is met).  Patient agreed to services and verbal consent obtained.

## 2018-08-05 NOTE — Chronic Care Management (AMB) (Signed)
  Chronic Care Management   Note  08/05/2018 Name: Olar Santini MRN: [id number] DOB: 1945/07/10  Chi Woodham is a 73 year old female who sees Dr. [name] for primary care. Dr. Laverta Baltimore asked the CCM team to consult the patient for medication affordability and chronic disease management. 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 Referral was placed 08/03/2018. Patient's last office visit was 08/03/2018.  Telephone outreach to patient today to introduce CCM services.  Ms. Keast was given information about Chronic Care Management services today including:  1. CCM service includes personalized support from designated clinical staff supervised by her physician, including individualized plan of care and coordination with other care providers 2. 24/7 contact phone numbers for assistance for urgent and routine care needs. 3. Service will only be billed when office clinical staff spend 20 minutes or more in a month to coordinate care. 4. Only one practitioner may furnish and bill the service in a calendar month. 5. The patient may stop CCM services at any time (effective at the end of the month) by phone call to the office staff. 6. The patient will be responsible for cost sharing (co-pay) of up to 20% of the service fee (after annual deductible is met).  Patient agreed to services and verbal consent obtained.      Plan: Face to face with CCM Team 08/11/2018 at 1:00    Tashawnda Bleiler E. Rollene Rotunda, RN, BSN Nurse Care Coordinator [facility] Care Management  [phone]

## 2018-08-11 ENCOUNTER — Ambulatory Visit (INDEPENDENT_AMBULATORY_CARE_PROVIDER_SITE_OTHER): Payer: PPO

## 2018-08-11 DIAGNOSIS — E1122 Type 2 diabetes mellitus with diabetic chronic kidney disease: Secondary | ICD-10-CM

## 2018-08-11 DIAGNOSIS — I129 Hypertensive chronic kidney disease with stage 1 through stage 4 chronic kidney disease, or unspecified chronic kidney disease: Secondary | ICD-10-CM | POA: Diagnosis not present

## 2018-08-11 DIAGNOSIS — E1142 Type 2 diabetes mellitus with diabetic polyneuropathy: Secondary | ICD-10-CM | POA: Diagnosis not present

## 2018-08-11 DIAGNOSIS — Z598 Other problems related to housing and economic circumstances: Secondary | ICD-10-CM

## 2018-08-11 DIAGNOSIS — E1165 Type 2 diabetes mellitus with hyperglycemia: Secondary | ICD-10-CM

## 2018-08-11 DIAGNOSIS — Z599 Problem related to housing and economic circumstances, unspecified: Secondary | ICD-10-CM

## 2018-08-11 DIAGNOSIS — IMO0002 Reserved for concepts with insufficient information to code with codable children: Secondary | ICD-10-CM

## 2018-08-11 NOTE — Chronic Care Management (AMB) (Addendum)
Chronic Care Management   Initial Visit Note  08/11/2018 Name: Charlotte Herrera MRN: [id number] DOB: 10-Apr-1946  Subjective: "I know I am really depressed" "My daughter died 2 years ago and I found her"  Objective:  Lab Results  Component Value Date   HGBA1C 6.3 (H) 04/26/2018   BP Readings from Last 3 Encounters:  07/07/18 128/68  05/31/18 130/60  05/05/18 120/66     Assessment: Charlotte Herrera is a 73 year old femalewho sees Dr. Enid Derry for primary care. Dr. Dirk Dress the CCM team to consult the patient for medication affordability and chronic disease management. 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 Referral was placed2/10/2018. Patient's last office visit was 08/03/2018. Today the CCM Team met with Charlotte Herrera face to face to establish health goals.  <no information>   Goals Addressed            This Visit's Progress   . "I need help affording my medication" (pt-stated)       Current Barriers:  . financial  Pharmacist Clinical Goal(s): Over the next 14 days, Charlotte Herrera will provide the necessary supplementary documents (proof of out of pocket prescription expenditure, proof of household income) needed for medication assistance applications to CCM pharmacist.   Interventions: . CCM pharmacist will apply for medication assistance program for Lantus made by [employer]. . CCM pharmacist and Charlotte Herrera will reapply for Medicare Extra Help  . New glucometer and testing supplies covered by HTA  Patient Self Care Activities:  [name] Kitchen Gather necessary documents needed to apply for medication assistance  *initial goal documentation      . I don't have food at home right now (pt-stated)       Current Barriers:  . Food insecurity  Clinical Pharmacist Clinical Goal(s): Over the next 14 days, Charlotte Herrera will utilize [facility] food Herrera given to her at initial office visit.   Interventions:   Provided  list of Charlotte Herrera  Patient Self Care Activities:  . Charlotte Herrera both served meals and grocery food banks   *initial goal documentation      . I have a lot of trouble sleeping (pt-stated)       Current Barriers:  . Depression and anxiety  . Grief  Pharmacist Clinical Goal(s): Over the next 14 days, Charlotte Herrera will report utilization of sleep strategies discussed at office visit today.   Interventions: . Reviewed sleep hygiene strategies, including lowering temperature of bedroom at night, using lavender essential oil, turning off electronics before bed, etc.   Patient Self Care Activities:  . Practice good sleep hygiene activities discussed in your appointment today  *initial goal documentation     . I know I am very depressed (pt-stated)       Current Barriers:  [name] Kitchen Knowledge deficit related to understanding symptoms of depression . Knowledge deficit related to understanding how and when to take depression medication .   Nurse Case Manager Clinical Goal(s):   Over the next 14 days, patient will take Welbutrin as prescribed IN THE AM  Over the next 14 days, patient will utilize sleep hygiene techniques discussed today   Interventions:  . Active listening, provided emotional support and reassurance . Discussed symptoms of depression . Offered resource for grief counseling including Hospice and [place] . Provided sleep hygiene information  .   Patient Self Care Activities:  . Take welbutrin in the morning .  Get plenty of sunshine . Remain engaged with family and friends . Try using lavendar essential oils for its calming effects           Follow up plan:  Telephone follow up appointment with CCM team member scheduled for: 1 week   Charlotte Herrera was given information about Chronic Care Management services today including:  1. CCM service includes personalized support from designated clinical staff supervised by her physician,  including individualized plan of care and coordination with other care providers 2. 24/7 contact phone numbers for assistance for urgent and routine care needs. 3. Service will only be billed when office clinical staff spend 20 minutes or more in a month to coordinate care. 4. Only one practitioner may furnish and bill the service in a calendar month. 5. The patient may stop CCM services at any time (effective at the end of the month) by phone call to the office staff. 6. The patient will be responsible for cost sharing (co-pay) of up to 20% of the service fee (after annual deductible is met).  Patient agreed to services and verbal consent obtained.    Damica Gravlin E. Rollene Rotunda, RN, BSN Nurse Care Coordinator [facility] Care Management  [phone]

## 2018-08-11 NOTE — Patient Instructions (Signed)
Thank you allowing the Chronic Care Management Team to be a part of your care! It was a pleasure speaking with you today!  1. Continue to check your bloods sugars as you have been. You are doing a great job. 2. Continue to take all your medications as prescribed 3. Eat some protein at each meal (eggs, cheese, peanutbutter, chicken, lean beef, lean pork)  4. Take your Bupropion XL in the mornings. You may not see a change in your mood for 4-6 weeks 5. Please consider contacting [facility] for Grief Counseling  [phone]. Please call Charlotte Herrera and I when you need [place]! We are always willing to help and listen.  CCM (Chronic Care Management) Team   Charlotte Fountain RN, BSN Nurse Care Coordinator  [phone]  Charlotte Herrera PharmD  Clinical Pharmacist  [phone]   Goals Addressed            This Visit's Progress   . "I need help affording my medication" (pt-stated)       Current Barriers:  . financial  Pharmacist Clinical Goal(s): Over the next 14 days, [name] will provide the necessary supplementary documents (proof of out of pocket prescription expenditure, proof of household income) needed for medication assistance applications to CCM pharmacist.   Interventions: . CCM pharmacist will apply for medication assistance program for Lantus made by [employer]. . CCM pharmacist and Charlotte Herrera will reapply for Medicare Extra Help   Patient Self Care Activities:  [name] Kitchen Gather necessary documents needed to apply for medication assistance  *initial goal documentation      . I don't have food at home right now (pt-stated)       Current Barriers:  . Food insecurity  Clinical Pharmacist Clinical Goal(s): Over the next 14 days, Charlotte Herrera will utilize [facility] food resources given to her at initial office visit.   Interventions:   Provided list of [place] resources  Patient Self Care Activities:  . Charlotte Herrera both served meals and  grocery food banks   *initial goal documentation      . I know I am very depressed (pt-stated)       Current Barriers:  [name] Kitchen Knowledge deficit related to understanding symptoms of depression . Knowledge deficit related to understanding how and when to take depression medication .   Nurse Case Manager Clinical Goal(s):   Over the next 14 days, patient will take Welbutrin as prescribed IN THE AM  Over the next 14 days, patient will utilize sleep hygiene techniques discussed today   Interventions:  . Active listening, provided emotional support and reassurance . Discussed symptoms of depression . Offered resource for grief counseling including [facility] and [place] . Provided sleep hygiene information  .   Patient Self Care Activities:  . Take welbutrin in the morning . Get plenty of sunshine . Remain engaged with family and friends . Try using lavendar essential oils for its calming effects         Print copy of patient instructions provided.   Telephone follow up appointment with CCM team member scheduled for: 1 week   10 LITTLE Things To Do When You're Feeling Too Down To Do Anything  Take a shower. Even if you plan to stay in all day Charlotte Herrera and not see a soul, take a shower. It takes the most effort to hop in to the shower but once you do, you'll feel immediate results. It will wake you up  and you'll be feeling much fresher (and cleaner too).  Brush and floss your teeth. Give your teeth a good brushing with a floss finish. It's a small task but it feels so good and you can check 'taking care of your health' off the list of things to do.  Do something small on your list. Most of [place] have some small thing we would like to get done (load of laundry, sew a button, email a friend). Doing one of these things will make you feel like you've accomplished something.  Drink water. Drinking water is easy right? It's also really beneficial for your health so keep a  glass beside you all day and take sips often. It gives you energy and prevents you from boredom eating.  Do some floor exercises. The last thing you want to do is exercise but it might be just the thing you need the most. Keep it simple and do exercises that involve sitting or laying on the floor. Even the smallest of exercises release chemicals in the brain that make you feel good. Yoga stretches or core exercises are going to make you feel good with minimal effort.  Make your bed. Making your bed takes a few minutes but it's productive and you'll feel relieved when it's done. An unmade bed is a huge visual reminder that you're having an unproductive day. Do it and consider it your housework for the day.  Put on some nice clothes. Take the sweatpants off even if you don't plan to go anywhere. Put on clothes that make you feel good. Take a look in the mirror so your brain recognizes the sweatpants have been replaced with clothes that make you look great. It's an instant confidence booster.  Wash the dishes. A pile of dirty dishes in the sink is a reflection of your mood. It's possible that if you wash up the dishes, your mood will follow suit. It's worth a try.  Cook a real meal. If you have the luxury to have a "do nothing" day, you have time to make a real meal for yourself. Make a meal that you love to eat. The process is good to get you out of the funk and the food will ensure you have more energy for tomorrow.  Write out your thoughts by hand. When you hand write, you stimulate your brain to focus on the moment that you're in so make yourself comfortable and write whatever comes into your mind. Put those thoughts out on paper so they stop spinning around in your head. Those thoughts might be the very thing holding you down.    Sleep Hygiene Why is sleep important? - It helps to make your body ready for the next day - It helps you to organize your memory and what you have learned during  the day     How do I make my sleep better? Choose one to try! (Put a check ? next to it)  - Turn off electronics (phones, computers, TV) at least 30 minutes before bed  THE LIGHT FROM ELECTRONICS MAKES YOUR BRAIN  THINK IT IS TIME TO BE AWAKE     - Don't drink caffeine in the afternoon - not after 3pm  (that means no soda, energy drinks, or tea) CAFFEINE MAKES YOUR BODY BE MORE ALERT, NOT READY FOR BED!      - If you can't sleep after 30 minutes, do something relaxing for a few minutes, then go back to bed "RE-STARTING" YOUR SLEEP  MAKES YOUR BODY  READY TO TRY AGAIN TO START SLEEPING   Fatty Liver Disease  Fatty liver disease occurs when too much fat has built up in your liver cells. Fatty liver disease is also called hepatic steatosis or steatohepatitis. The liver removes harmful substances from your bloodstream and produces fluids that your body needs. It also helps your body use and store energy from the food you eat. In many cases, fatty liver disease does not cause symptoms or problems. It is often diagnosed when tests are being done for other reasons. However, over time, fatty liver can cause inflammation that may lead to more serious liver problems, such as scarring of the liver (cirrhosis) and liver failure. Fatty liver is associated with insulin resistance, increased body fat, high blood pressure (hypertension), and high cholesterol. These are features of metabolic syndrome and increase your risk for stroke, diabetes, and heart disease. What are the causes? This condition may be caused by:  Drinking too much alcohol.  Poor nutrition.  Obesity.  Cushing's syndrome.  Diabetes.  High cholesterol.  Certain drugs.  Poisons.  Some viral infections.  Pregnancy. What increases the risk? You are more likely to develop this condition if you:  Abuse alcohol.  Are overweight.  Have diabetes.  Have hepatitis.  Have a high triglyceride level.  Are  pregnant. What are the signs or symptoms? Fatty liver disease often does not cause symptoms. If symptoms do develop, they can include:  Fatigue.  Weakness.  Weight loss.  Confusion.  Abdominal pain.  Nausea and vomiting.  Yellowing of your skin and the white parts of your eyes (jaundice).  Itchy skin. How is this diagnosed? This condition may be diagnosed by:  A physical exam and medical history.  Blood tests.  Imaging tests, such as an ultrasound, CT scan, or MRI.  A liver biopsy. A small sample of liver tissue is removed using a needle. The sample is then looked at under a microscope. How is this treated? Fatty liver disease is often caused by other health conditions. Treatment for fatty liver may involve medicines and lifestyle changes to manage conditions such as:  Alcoholism.  High cholesterol.  Diabetes.  Being overweight or obese. Follow these instructions at home:   Do not drink alcohol. If you have trouble quitting, ask your health care provider how to safely quit with the help of medicine or a supervised program. This is important to keep your condition from getting worse.  Eat a healthy diet as told by your health care provider. Ask your health care provider about working with a diet and nutrition specialist (dietitian) to develop an eating plan.  Exercise regularly. This can help you lose weight and control your cholesterol and diabetes. Talk to your health care provider about an exercise plan and which activities are best for you.  Take over-the-counter and prescription medicines only as told by your health care provider.  Keep all follow-up visits as told by your health care provider. This is important. Contact a health care provider if: You have trouble controlling your:  Blood sugar. This is especially important if you have diabetes.  Cholesterol.  Drinking of alcohol. Get help right away if:  You have abdominal pain.  You have  jaundice.  You have nausea and vomiting.  You vomit blood or material that looks like coffee grounds.  You have stools that are black, tar-like, or bloody. Summary  Fatty liver disease develops when too much fat builds up in the cells of  your liver.  Fatty liver disease often causes no symptoms or problems. However, over time, fatty liver can cause inflammation that may lead to more serious liver problems, such as scarring of the liver (cirrhosis).  You are more likely to develop this condition if you abuse alcohol, are pregnant, are overweight, have diabetes, have hepatitis, or have high triglyceride levels.  Contact your health care provider if you have trouble controlling your weight, blood sugar, cholesterol, or drinking of alcohol. This information is not intended to replace advice given to you by your health care provider. Make sure you discuss any questions you have with your health care provider. Document Released: 07/31/2005 Document Revised: 03/24/2017 Document Reviewed: 03/24/2017 Elsevier Interactive Patient Education  2019 [employer].

## 2018-08-11 NOTE — Chronic Care Management (AMB) (Signed)
Chronic Care Management   Note  08/11/2018 Name: Charlotte Herrera MRN: [id number] DOB: Dec 06, 1945  Subjective:   Does the patient  feel that his/her medications are working for him/her?  no  Has the patient been experiencing any side effects to the medications prescribed?  yes  Does the patient measure his/her own blood glucose at home?  yes   Does the patient measure his/her own blood pressure at home? yes   Does the patient have any problems obtaining medications due to transportation or finances?   yes  Understanding of regimen: good Understanding of indications: good Potential of compliance: fair  Objective: Lab Results  Component Value Date   CREATININE 1.04 (H) 04/26/2018   CREATININE 1.03 (H) 11/12/2017   CREATININE 0.97 (H) 04/06/2017    Lab Results  Component Value Date   HGBA1C 6.3 (H) 04/26/2018    Lipid Panel     Component Value Date/Time   CHOL 154 04/26/2018 1139   TRIG 211 (H) 04/26/2018 1139   HDL 42 04/26/2018 1139   CHOLHDL 3.7 04/26/2018 1139   CHOLHDL 2.9 08/03/2017 1401   VLDL 61 (H) 01/04/2017 0955   LDLCALC 70 04/26/2018 1139   LDLCALC 75 08/03/2017 1401    BP Readings from Last 3 Encounters:  07/07/18 128/68  05/31/18 130/60  05/05/18 120/66    Allergies  Allergen Reactions  . Aspirin   . Duloxetine Hcl   . Penicillins   . Codeine Nausea And Vomiting and Rash  . Lyrica [Pregabalin] Nausea And Vomiting  . Sulfa Antibiotics Swelling    Medications Reviewed Today    Reviewed by Charlotte Herrera, [facility] (Pharmacist) on 08/11/18 at 1417  Med List Status: <None>  Medication Order Taking? Sig Documenting Provider Last Dose Status Informant  Blood Glucose Monitoring Suppl (ONE TOUCH ULTRA MINI) W/DEVICE KIT [id number] Yes 1 Device by Does not apply route 2 (two) times daily. Charlotte Nova, MD Taking Active   buPROPion (WELLBUTRIN XL) 150 MG 24 hr tablet [id number] No Take 150 mg by mouth daily. Charlotte Courser, MD Not Taking Active      Med Note Charlotte Herrera, Garnetta Buddy Aug 11, 2018  2:13 PM) Was no longer taking, taking at night and experiencing jitters; willing to retry  cetirizine (ZYRTEC) 10 MG tablet [id number]  Take 10 mg by mouth daily as needed for allergies. [provider]  Active   clopidogrel (PLAVIX) 75 MG tablet [id number] Yes Take 1 tablet (75 mg total) by mouth daily. Charlotte Courser, MD Taking Active   fluticasone (FLONASE) 50 MCG/ACT nasal spray [id number] Yes Place 2 sprays into both nostrils daily. Charlotte Courser, MD Taking Active   gabapentin (NEURONTIN) 300 MG capsule [id number] Yes Take 1 capsule (300 mg total) by mouth at bedtime. Charlotte Courser, MD Taking Active   glucose blood (ONE TOUCH ULTRA TEST) test strip [id number] Yes TEST twice a day Charlotte Nova, MD Taking Active   Insulin Glargine (LANTUS SOLOSTAR) 100 UNIT/ML Solostar Pen [id number] Yes Inject 40 Units into the skin at bedtime. Charlotte Courser, MD Taking Active   Insulin Pen Needle (BD PEN NEEDLE MICRO U/F) 32G X 6 MM MISC [id number] Yes USE AS DIRECTED once AT BEDTIME for insulin administration DX:E11.42 Lon:99 months Herrera, Charlotte Anis, MD Taking Active   ketoconazole (NIZORAL) 2 % cream [id number] No Apply 1 application topically daily as needed for irritation. Where needed  Patient not taking:  Reported on 08/11/2018   Herrera,  Charlotte Anis, MD Not Taking Active   levothyroxine (SYNTHROID, LEVOTHROID) 50 MCG tablet [id number] Yes TAKE 1 TABLET(50 MCG) BY MOUTH DAILY BEFORE BREAKFAST Herrera, Charlotte Anis, MD Taking Active   losartan (COZAAR) 50 MG tablet [id number] Yes Take 1 tablet (50 mg total) by mouth daily. Charlotte Courser, MD Taking Active   metFORMIN (GLUCOPHAGE) 500 MG tablet [id number] Yes Take 1 tablet (500 mg total) by mouth daily with breakfast. Charlotte Courser, MD Taking Active            Med Note Charlotte Herrera, Charlotte Herrera   Thu Aug 11, 2018  2:16 PM) Needs refill  Charlotte Herrera LANCETS 74Y MISC [id number]  33 g by Does not apply route 2 (two)  times daily. Test twice a day as directed Charlotte Nova, MD  Active   pantoprazole (PROTONIX) 20 MG tablet [id number] Yes Take 1 tablet (20 mg total) by mouth daily. (for heartburn, reflux). Caution:prolonged use may increase risk of pneumonia, colitis, osteoporosis, anemia Herrera, Charlotte Anis, MD Taking Active   pravastatin (PRAVACHOL) 20 MG tablet [id number] Yes Take 1 tablet (20 mg total) by mouth at bedtime. For cholesterol Herrera, Charlotte Anis, MD Taking Active   traZODone (DESYREL) 50 MG tablet [id number] No Take 50 mg by mouth at bedtime. Charlotte Courser, MD Not Taking Active           Assessment:   #Diabetes: education and counseling provided regarding ADA diet  #Financial resources: pursue Medicare Extra Help application prior to manufacturers assistance for Lantus; provided resource for food in [employer]  #sleep hygiene: counseling and strategies  #depression: counseling, active listening; patient was taking burpropion in the evening and experiencing jittery feeling (thought medication was for sleep)  Goals Addressed            This Visit's Progress   . "I need help affording my medication" (pt-stated)       Current Barriers:  . financial  Pharmacist Clinical Goal(s): Over the next 14 days, [name] will provide the necessary supplementary documents (proof of out of pocket prescription expenditure, proof of household income) needed for medication assistance applications to CCM pharmacist.   Interventions: . CCM pharmacist will apply for medication assistance program for Lantus made by [employer]. . CCM pharmacist and Charlotte Herrera will reapply for Medicare Extra Help  . New glucometer and testing supplies covered by HTA  Patient Self Care Activities:  [name] Kitchen Gather necessary documents needed to apply for medication assistance  *initial goal documentation      . I don't have food at home right now (pt-stated)       Current Barriers:  . Food insecurity  Clinical Pharmacist  Clinical Goal(s): Over the next 14 days, Charlotte Herrera will utilize [facility] food resources given to her at initial office visit.   Interventions:   Provided list of [place] resources  Patient Self Care Activities:  . [name] both served meals and grocery food banks   *initial goal documentation      . I have a lot of trouble sleeping (pt-stated)       Current Barriers:  . Depression and anxiety  . Grief  Pharmacist Clinical Goal(s): Over the next 14 days, Ms. Schoenfeld will report utilization of sleep strategies discussed at office visit today.   Interventions: . Reviewed sleep hygiene strategies, including lowering temperature of bedroom at night, using lavender essential oil, turning off electronics before bed, etc.   Patient Self Care Activities:  .  Practice good sleep hygiene activities discussed in your appointment today  *initial goal documentation     . I know I am very depressed (pt-stated)       Current Barriers:  [name] Kitchen Knowledge deficit related to understanding symptoms of depression . Knowledge deficit related to understanding how and when to take depression medication .   Nurse Case Manager Clinical Goal(s):   Over the next 14 days, patient will take Welbutrin as prescribed IN THE AM  Over the next 14 days, patient will utilize sleep hygiene techniques discussed today   Interventions:  . Active listening, provided emotional support and reassurance . Discussed symptoms of depression . Offered resource for grief counseling including Hospice and [place] . Provided sleep hygiene information  .   Patient Self Care Activities:  . Take welbutrin in the morning . Get plenty of sunshine . Remain engaged with family and friends . Try using lavendar essential oils for its calming effects         Plan: Recommendations discussed with provider - Change glucometer and testing supplies to Freestyle, OneTouch, or AccuChek for better  cost savings  Recommendations discussed with patient - please retry the bupropion XL 147m taken daily in the morning  Follow up plan: Telephone follow up appointment with CCM team member scheduled for: RNCM in 7 days, pharmacist in 188days  JRuben Reason [place]ow[phone]

## 2018-08-13 ENCOUNTER — Other Ambulatory Visit: Payer: Self-pay | Admitting: Family Medicine

## 2018-08-13 DIAGNOSIS — E1165 Type 2 diabetes mellitus with hyperglycemia: Principal | ICD-10-CM

## 2018-08-13 DIAGNOSIS — IMO0002 Reserved for concepts with insufficient information to code with codable children: Secondary | ICD-10-CM

## 2018-08-13 DIAGNOSIS — E1142 Type 2 diabetes mellitus with diabetic polyneuropathy: Secondary | ICD-10-CM

## 2018-08-22 ENCOUNTER — Telehealth: Payer: Self-pay | Admitting: Family Medicine

## 2018-08-22 DIAGNOSIS — IMO0002 Reserved for concepts with insufficient information to code with codable children: Secondary | ICD-10-CM

## 2018-08-22 DIAGNOSIS — E1165 Type 2 diabetes mellitus with hyperglycemia: Principal | ICD-10-CM

## 2018-08-22 DIAGNOSIS — E1142 Type 2 diabetes mellitus with diabetic polyneuropathy: Secondary | ICD-10-CM

## 2018-08-22 MED ORDER — METFORMIN HCL 500 MG PO TABS: 500.0000 mg | ORAL_TABLET | Freq: Two times a day (BID) | ORAL | 2 refills | Status: DC

## 2018-08-22 NOTE — Telephone Encounter (Signed)
Pt.notified

## 2018-08-22 NOTE — Telephone Encounter (Signed)
Copied from [place] [id number]. Topic: Quick Communication - See Telephone Encounter >> Aug 22, 2018 12:19 PM Vernona Rieger wrote: CRM for notification. See Telephone encounter for: 08/22/18.  Patient states she requested her refill for metFORMIN (GLUCOPHAGE) 500 MG tablet two weeks ago and it was denied because it says it was too soon. She said she has been out and has been taking it twice a day.Please Advise.  [facility] [place], [place] AT [facility] OF [address] [place] [address] Phone: [phone] Fax: [phone]

## 2018-08-22 NOTE — Telephone Encounter (Signed)
Per 04/19/2018 office visit, she told us that she stopped the night-time metformin I'm fine to increase it back to twice a day We just didn't have that in the chart, so we'll encourage her to let us know if there are changes to her med list New Rx written

## 2018-09-03 ENCOUNTER — Other Ambulatory Visit: Payer: Self-pay | Admitting: Family Medicine

## 2018-09-03 DIAGNOSIS — E538 Deficiency of other specified B group vitamins: Secondary | ICD-10-CM

## 2018-09-03 DIAGNOSIS — E038 Other specified hypothyroidism: Secondary | ICD-10-CM

## 2018-09-03 DIAGNOSIS — E559 Vitamin D deficiency, unspecified: Secondary | ICD-10-CM

## 2018-09-03 NOTE — Telephone Encounter (Signed)
Lab Results  Component Value Date   TSH 1.04 08/03/2017   Patient will need a thyroid blood test I've put in orders Please ask her to come by this week for labs to make sure the dose is still correct I did sent her medicine, though, so she won't run out I'll also check her vit D and vitamin B12 to see if she needs more of that Her other labs will be due at her follow-up appointment; however, her appt for April 28th is one day too soon; her last labs were done October 29th; please push her appt back to April 29th or just after Thank you

## 2018-09-04 DIAGNOSIS — R6889 Other general symptoms and signs: Secondary | ICD-10-CM | POA: Diagnosis not present

## 2018-09-04 DIAGNOSIS — Z20828 Contact with and (suspected) exposure to other viral communicable diseases: Secondary | ICD-10-CM | POA: Diagnosis not present

## 2018-09-04 DIAGNOSIS — J111 Influenza due to unidentified influenza virus with other respiratory manifestations: Secondary | ICD-10-CM | POA: Diagnosis not present

## 2018-09-04 DIAGNOSIS — R509 Fever, unspecified: Secondary | ICD-10-CM | POA: Diagnosis not present

## 2018-09-05 ENCOUNTER — Ambulatory Visit: Payer: Self-pay

## 2018-09-05 DIAGNOSIS — E1165 Type 2 diabetes mellitus with hyperglycemia: Principal | ICD-10-CM

## 2018-09-05 DIAGNOSIS — E1142 Type 2 diabetes mellitus with diabetic polyneuropathy: Secondary | ICD-10-CM

## 2018-09-05 DIAGNOSIS — E1122 Type 2 diabetes mellitus with diabetic chronic kidney disease: Secondary | ICD-10-CM

## 2018-09-05 DIAGNOSIS — Z599 Problem related to housing and economic circumstances, unspecified: Secondary | ICD-10-CM

## 2018-09-05 DIAGNOSIS — I129 Hypertensive chronic kidney disease with stage 1 through stage 4 chronic kidney disease, or unspecified chronic kidney disease: Secondary | ICD-10-CM

## 2018-09-05 DIAGNOSIS — IMO0002 Reserved for concepts with insufficient information to code with codable children: Secondary | ICD-10-CM

## 2018-09-05 DIAGNOSIS — Z598 Other problems related to housing and economic circumstances: Secondary | ICD-10-CM

## 2018-09-05 NOTE — Chronic Care Management (AMB) (Signed)
   Chronic Care Management   Unsuccessful Call Note 09/05/2018 Name: Charlotte Herrera MRN: [id number] DOB: [birth year]  [name] a 73year old femalewho seesDr. Mikki Harbor primary care. Dr. Dirk Dress the CCM team to consult the patient formedication affordability and chronic disease management. 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 Referral was placed2/10/2018. Patient's last office visit was2/10/2018. The CCM Team met with Ms. Tarazon face to face 08/11/2018 to establish health goals.  Today, CCM RN CM was asked to call patient by [facility], [facility], in response to a call from patient to Nurse Line.   Was unable to reach patient via telephone. Unfortunately, patients mailbox is full and CCM RN CM was unable to leave message. (unsuccessful outreach #1).   Plan: Second outreach attempt later today.     Sharleen Szczesny E. Rollene Rotunda, RN, BSN Nurse Care Coordinator [facility] Care Management  [phone]

## 2018-09-05 NOTE — Telephone Encounter (Signed)
Left detailed voicemail

## 2018-09-06 ENCOUNTER — Ambulatory Visit: Payer: Self-pay

## 2018-09-06 DIAGNOSIS — F411 Generalized anxiety disorder: Secondary | ICD-10-CM

## 2018-09-06 DIAGNOSIS — E1165 Type 2 diabetes mellitus with hyperglycemia: Principal | ICD-10-CM

## 2018-09-06 DIAGNOSIS — E1142 Type 2 diabetes mellitus with diabetic polyneuropathy: Secondary | ICD-10-CM

## 2018-09-06 DIAGNOSIS — IMO0002 Reserved for concepts with insufficient information to code with codable children: Secondary | ICD-10-CM

## 2018-09-06 DIAGNOSIS — F331 Major depressive disorder, recurrent, moderate: Secondary | ICD-10-CM

## 2018-09-06 DIAGNOSIS — Z598 Other problems related to housing and economic circumstances: Secondary | ICD-10-CM

## 2018-09-06 DIAGNOSIS — Z599 Problem related to housing and economic circumstances, unspecified: Secondary | ICD-10-CM

## 2018-09-06 NOTE — Chronic Care Management (AMB) (Signed)
Chronic Care Management   Follow Up Note   09/07/2018 Name: Charlotte Herrera MRN: [id number] DOB: Jul 12, 1945  Referred by: Arnetha Courser, MD Herrera for referral : Chronic Care Management (follow up to [facility] nurse call)   Subjective: "I called the nurse line on Sunday to get advice on if I should go to urgent care"   Objective:  Assessment: Charlotte Herrera a 73year old femalewho seesDr. Mikki Herrera primary care. Dr. Dirk Herrera the CCM team to consult the patient formedication affordability and chronic disease management. 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 Referral was placed2/10/2018. Patient's last office visit was2/10/2018. CCM Team met with Charlotte Herrera face to face to establish health goals 08/11/2018.  09/05/2018, CCM RN CM received notification that patient had called [facility] Nurse line on Sunday. CCM RN CM attempted to follow up same day unsuccessfully. Today, RN CM was able to engage patient via telephone to discuss Herrera for Nurse Line Call over the weekend.  Patient states she called Nurse Line to discuss symptoms of flu. Patient states she kept her granddaughter on Friday, who subsequently tested positive on Saturday for the flu. Charlotte Herrera developed fever, cough, nasal congestion on Sunday. She was advised to go to Urgent Care where she did not test positive for the flu, but was provided an antiviral prophylacticly. Her symptoms have improved since taking her medications.  Goals Addressed            This Visit's Progress   . I am out of my metformin and the pharmacy says it is too soon to refill (pt-stated)       Charlotte Herrera states she has been out of her Metformin for several days. Per EMR documentation and patient statement, she has increased her dosage to BID. Her previous prescription was for Q day. She ran out of her metformin before time to refill. Pharmacy has told her she is unable to fill at this  time. As a result, she has increased her daily dose of lantus to combat elevated blood sugar levels. This has caused her to run out of her lantus prior to refill window. Per EMR new prescritpion to reflect patients self increase was called in on 08/22/2018. Unsure why patient was unable to pick up new prescription for metformin. Have notified [facility] Herrera who will contact patient tomorrow to assist with resolving medication discrepancy.  Current Barriers:  . Film/video editor.  . Non-adherence to prescribed medication regimen . Difficulty obtaining medications  . Depression and Anxiety  Nurse Case Manager Clinical Goal(s):  [name] Kitchen Over the next 2 days, patient will work with Charlotte Herrera to address needs related to obtaining insulin and metformin  Interventions:  . Advised patient to continue to take all medications as prescribed including her lantus 40u Q HS . Reviewed medications with patient and discussed importance of taking medications as prescribed and not increasing lantus dose PRN . Collaborated with Charlotte Herrera, Charlotte Herrera regarding patients inability to obtain metformin and lantus secondary to pharmacy telling patient it is too soon to refill  Patient Self Care Activities:  . Self administers medications as prescribed  . Engage with [facility] Herrera  Plan:  . RNCM will colloborate with [facility] Herrera and place patient call for tomorrow on Herrera schedule   Initial goal documentation     . I don't have food at home right now (pt-stated)  Charlotte Herrera states she has enough food in her home at present. She has not felt like eating much since her acute illness on Sunday. She is aware of the available food bank resources in [facility] and will utilize if needed.   Current Barriers:  . Food insecurity . Current health status (flu)  Clinical Herrera Clinical Goal(s): Goal revised 09/06/2018  Over the next 30 days, patient  will utilize available food resources if need to eliminate food insecurity  Interventions:   Verified patient has list of available food resources provided during initial face to face visit with CCM Team  Patient Self Care Activities:  . [name] both served meals and grocery food banks   Please see past updates related to this goal by clicking on the "Past Updates" button in the selected goal       . I have a lot of trouble sleeping (pt-stated)       Charlotte Herrera has attempted to improve her sleep quality by utilizing previously discussed methods. She HAS NOT restarted her Welbutrin as directed. She is unable to give a Herrera for not taking as prescribed.    Current Barriers:  . Depression and anxiety  . Grief . Current Health Status (flu)  Herrera Clinical Goal(s): Goal revised 09/06/2018  Over the next 30 days, Charlotte Herrera will report utilization of sleep strategies discussed at office visit today.   Interventions: . Reinforced sleep hygiene strategies previously discussed, including lowering temperature of bedroom at night, using lavender essential oil, turning off electronics before bed, etc.   Patient Self Care Activities:  . Practice good sleep hygiene activities discussed in your appointment today  Please see past updates related to this goal by clicking on the "Past Updates" button in the selected goal      . I know I am very depressed (pt-stated)       Current Barriers:  [name] Kitchen Knowledge deficit related to understanding symptoms of depression . Knowledge deficit related to understanding how and when to take depression medication .   Nurse Case Manager Clinical Goal(s):   Over the next 30 days, patient will take Welbutrin as prescribed IN THE AM  Over the next 30 days, patient will utilize sleep hygiene techniques discussed today   Interventions:  . Active listening, provided emotional support and reassurance . Discussed current symptoms of depression patient is  experiencing . Offered resource for grief counseling including [facility] and [place] . Reinforced sleep hygiene   Patient Self Care Activities:  . Take welbutrin in the morning . Get plenty of sunshine . Remain engaged with family and friends . Try using lavendar essential oils for its calming effects   Please see past updates in goals section for documentation of goal progression         Telephone follow up appointment with CCM team member scheduled for: 2 weeks     Taisia Fantini E. Rollene Rotunda, RN, BSN Nurse Care Coordinator [facility] Care Management  [phone]

## 2018-09-07 ENCOUNTER — Other Ambulatory Visit: Payer: Self-pay | Admitting: Family Medicine

## 2018-09-07 ENCOUNTER — Other Ambulatory Visit: Payer: Self-pay

## 2018-09-07 ENCOUNTER — Ambulatory Visit: Payer: PPO | Admitting: Pharmacist

## 2018-09-07 DIAGNOSIS — E1142 Type 2 diabetes mellitus with diabetic polyneuropathy: Secondary | ICD-10-CM

## 2018-09-07 DIAGNOSIS — E1165 Type 2 diabetes mellitus with hyperglycemia: Principal | ICD-10-CM

## 2018-09-07 DIAGNOSIS — IMO0002 Reserved for concepts with insufficient information to code with codable children: Secondary | ICD-10-CM

## 2018-09-07 DIAGNOSIS — Z598 Other problems related to housing and economic circumstances: Secondary | ICD-10-CM

## 2018-09-07 DIAGNOSIS — Z599 Problem related to housing and economic circumstances, unspecified: Secondary | ICD-10-CM

## 2018-09-07 NOTE — Patient Instructions (Signed)
Goals Addressed            This Visit's Progress   . "I need help affording my medication" (pt-stated)       Current Barriers:  . financial  Pharmacist Clinical Goal(s): Over the next 14 days, Ms.. Lamboy will provide the necessary supplementary documents (proof of out of pocket prescription expenditure, proof of household income) needed for medication assistance applications to CCM pharmacist.   Interventions: . CCM pharmacist will apply for medication assistance program for Lantus made by [employer]. . CCM pharmacist and Ms. Sealy will reapply for Medicare Extra Help  . New glucometer and testing supplies covered by HTA  Updated:   Collaboration with community pharmacy to correct pharmacy claims data   New glucometer and testing supplies Nurse, adult) covered by [employer]  Patient has provided financial household income, still need TROOP  Patient Self Care Activities:  [name] Kitchen Gather necessary documents needed to apply for medication assistance  Please see past updates related to this goal by clicking on the "Past Updates" button in the selected goal         The patient verbalized understanding of instructions provided today and declined a print copy of patient instruction materials.

## 2018-09-07 NOTE — Chronic Care Management (AMB) (Signed)
  Chronic Care Management   Follow Up Note   09/07/2018 Name: Charlotte Herrera MRN: [id number] DOB: 1946/05/07  Referred by: Arnetha Courser, MD Reason for referral : Chronic Care Management (Pharmacy follow up)  Subjective: Charlotte Herrera is a 74 y.o. year old female who is a primary care patient of Lada, Satira Anis, MD. The CCM team was consulted for assistance with chronic disease management and care coordination needs.    Outreach to patient to follow up on progression of pharmacy goals. HIPAA identifiers verified.  Assessment:   #med assistance: Patient has provided household income, still need TROOP from pharmacy in order to pursue Lantus medication assistance provided by [employer]  #med adherence: Patient states she is nearly out of Lantus and metformin and having difficulty filling prescriptions at pharmacy. Resolved billing and days supply discrepancy with pharmacy [facility] [place])  #DM: Patient is administering metformin 500mg  BID as prescribed; has titrated her Lantus to 42 units QHS; needs new glucomter (OneTouch) and supplies  Goals Addressed            This Visit's Progress   . "I need help affording my medication" (pt-stated)       Current Barriers:  . financial  Pharmacist Clinical Goal(s): Over the next 14 days, [name] will provide the necessary supplementary documents (proof of out of pocket prescription expenditure, proof of household income) needed for medication assistance applications to CCM pharmacist.   Interventions: . CCM pharmacist will apply for medication assistance program for Lantus made by [employer]. . CCM pharmacist and [name] will reapply for Medicare Extra Help  . New glucometer and testing supplies covered by HTA  Updated:   Collaboration with community pharmacy to correct pharmacy claims data   New glucometer and testing supplies Nurse, adult) covered by [employer]  Patient has provided financial household income, still need TROOP  Patient Self  Care Activities:  [name] Kitchen Gather necessary documents needed to apply for medication assistance  Please see past updates related to this goal by clicking on the "Past Updates" button in the selected goal           Telephone follow up appointment with CCM team member scheduled for: Pharm D telephone follow up 7 days  [name], PharmD Clinical Pharmacist [place] [phone]

## 2018-09-07 NOTE — Patient Instructions (Signed)
Thank you allowing the Chronic Care Management Team to be a part of your care! It was a pleasure speaking with you today!  1. Please engage with [facility] Pharmacist tomorrow to receive assistance with medication affordability and availability 2. Continue to take all medications as prescribed and please do not adjust dosages until you discuss with Dr. Sanda Klein 3. Please consider restarting your Welbutrin in the mornings. It will help with you depression symptoms. 4. Continue to utilize sleep hygiene stratagies. 5. Please contact the CCM Team if you ever have a problem with your medications. Do not wait until you run out or you have been out for several days.  CCM (Chronic Care Management) Team   Trish Fountain RN, BSN Nurse Care Coordinator  [phone]  [name] PharmD  Clinical Pharmacist  [phone]   Elliot Gurney, LCSW Clinical Social Worker [phone]  Goals Addressed            This Visit's Progress   . I am out of my metformin and the pharmacy says it is too soon to refill (pt-stated)       Current Barriers:  . Film/video editor.  . Non-adherence to prescribed medication regimen . Difficulty obtaining medications  . Depression and Anxiety  Nurse Case Manager Clinical Goal(s):  [name] Kitchen Over the next 2 days, patient will work with [facility] Pharmacist to address needs related to obtaining insulin and metformin  Interventions:  . Advised patient to continue to take all medications as prescribed including her lantus 40u Q HS . Reviewed medications with patient and discussed importance of taking medications as prescribed and not increasing lantus dose PRN . Collaborated with [name], [facility] Pharmacist regarding patients inability to obtain metformin and lantus secondary to pharmacy telling patient it is too soon to refill  Patient Self Care Activities:  . Self administers medications as prescribed  . Engage with [facility] Clinic Pharmacist  Plan:   . RNCM will colloborate with [facility] Pharmacist and place patient call for tomorrow on Pharmacist schedule   Initial goal documentation     . I don't have food at home right now (pt-stated)       Current Barriers:  . Food insecurity . Current health status (flu)  Clinical Pharmacist Clinical Goal(s): Goal revised 09/06/2018  Over the next 30 days, patient will utilize available food resources if need to eliminate food insecurity  Interventions:   Verified patient has list of available food resources provided during initial face to face visit with CCM Team  Patient Self Care Activities:  . [name] both served meals and grocery food banks   Please see past updates related to this goal by clicking on the "Past Updates" button in the selected goal       . I have a lot of trouble sleeping (pt-stated)       Current Barriers:  . Depression and anxiety  . Grief . Current Health Status (flu)  Pharmacist Clinical Goal(s): Goal revised 09/06/2018  Over the next 30 days, Ms. Hamler will report utilization of sleep strategies discussed at office visit today.   Interventions: . Reinforced sleep hygiene strategies previously discussed, including lowering temperature of bedroom at night, using lavender essential oil, turning off electronics before bed, etc.   Patient Self Care Activities:  . Practice good sleep hygiene activities discussed in your appointment today  Please see past updates related to this goal by clicking on the "Past Updates" button in the selected goal      .  I know I am very depressed (pt-stated)       Current Barriers:  [name] Kitchen Knowledge deficit related to understanding symptoms of depression . Knowledge deficit related to understanding how and when to take depression medication .   Nurse Case Manager Clinical Goal(s):   Over the next 30 days, patient will take Welbutrin as prescribed IN THE AM  Over the next 30 days, patient will utilize sleep hygiene  techniques discussed today   Interventions:  . Active listening, provided emotional support and reassurance . Discussed current symptoms of depression patient is experiencing . Offered resource for grief counseling including [facility]osp[facility] and [place] . Reinforced sleep hygiene   Patient Self Care Activities:  . Take welbutrin in the morning . Get plenty of sunshine . Remain engaged with family and friends . Try using lavendar essential oils for its calming effects   Please see past updates in goals section for documentation of goal progression        The patient verbalized understanding of instructions provided today and declined a print copy of patient instruction materials.   Telephone follow up appointment with CCM team member scheduled for: 2 weeks

## 2018-09-08 ENCOUNTER — Other Ambulatory Visit: Payer: Self-pay

## 2018-09-08 MED ORDER — ONETOUCH DELICA LANCETS 33G MISC: 33.0000 g | Freq: Two times a day (BID) | 2 refills | Status: AC

## 2018-09-08 MED ORDER — ONETOUCH ULTRA MINI W/DEVICE KIT: 1.0000 | PACK | Freq: Two times a day (BID) | 0 refills | Status: AC

## 2018-09-08 MED ORDER — GLUCOSE BLOOD VI STRP: ORAL_STRIP | 3 refills | Status: AC

## 2018-09-08 NOTE — Telephone Encounter (Signed)
Per Julie

## 2018-09-08 NOTE — Addendum Note (Signed)
Addended by: Fredderick Severance on: 09/08/2018 01:18 PM   Modules accepted: Orders

## 2018-09-13 ENCOUNTER — Telehealth: Payer: Self-pay

## 2018-09-13 ENCOUNTER — Ambulatory Visit: Payer: Self-pay | Admitting: Pharmacist

## 2018-09-13 DIAGNOSIS — IMO0002 Reserved for concepts with insufficient information to code with codable children: Secondary | ICD-10-CM

## 2018-09-13 DIAGNOSIS — Z599 Problem related to housing and economic circumstances, unspecified: Secondary | ICD-10-CM

## 2018-09-13 DIAGNOSIS — Z598 Other problems related to housing and economic circumstances: Secondary | ICD-10-CM

## 2018-09-13 DIAGNOSIS — E1165 Type 2 diabetes mellitus with hyperglycemia: Principal | ICD-10-CM

## 2018-09-13 DIAGNOSIS — E1142 Type 2 diabetes mellitus with diabetic polyneuropathy: Secondary | ICD-10-CM

## 2018-09-13 NOTE — Chronic Care Management (AMB) (Signed)
  Chronic Care Management   Note  09/13/2018 Name: Charlotte Herrera MRN: [id number] DOB: February 04, 1946  73 y.o. year old female referred to Chronic Care Management by Dr. Enid Derry for medication assistance, social needs.   Was unable to reach patient via telephone today and have left HIPAA compliant voicemail asking patient to return my call. (unsuccessful outreach #1).  [name], PharmD Clinical Pharmacist [facility] [phone]

## 2018-09-20 ENCOUNTER — Other Ambulatory Visit: Payer: Self-pay

## 2018-09-20 ENCOUNTER — Ambulatory Visit (INDEPENDENT_AMBULATORY_CARE_PROVIDER_SITE_OTHER): Payer: PPO

## 2018-09-20 DIAGNOSIS — E1165 Type 2 diabetes mellitus with hyperglycemia: Secondary | ICD-10-CM | POA: Diagnosis not present

## 2018-09-20 DIAGNOSIS — K7469 Other cirrhosis of liver: Secondary | ICD-10-CM

## 2018-09-20 DIAGNOSIS — E1122 Type 2 diabetes mellitus with diabetic chronic kidney disease: Secondary | ICD-10-CM

## 2018-09-20 DIAGNOSIS — E1142 Type 2 diabetes mellitus with diabetic polyneuropathy: Secondary | ICD-10-CM

## 2018-09-20 DIAGNOSIS — I129 Hypertensive chronic kidney disease with stage 1 through stage 4 chronic kidney disease, or unspecified chronic kidney disease: Secondary | ICD-10-CM | POA: Diagnosis not present

## 2018-09-20 DIAGNOSIS — IMO0002 Reserved for concepts with insufficient information to code with codable children: Secondary | ICD-10-CM

## 2018-09-20 NOTE — Chronic Care Management (AMB) (Signed)
Chronic Care Management   Follow Up Note   09/20/2018 Name: Charlotte Herrera MRN: [id number] DOB: 08-Jan-1946  Referred by: Arnetha Courser, MD Reason for referral : Chronic Care Management (follow up depression/medication)   Subjective: "I was doing ok but I just have not felt well since Sunday"   Objective:  Lab Results  Component Value Date   HGBA1C 6.3 (H) 04/26/2018    Assessment: Charlotte Herrera a 73year old femalewho seesDr.Melinda Ladafor primary care. Dr. Dirk Dress the CCM team to consult the patient formedication affordability and chronic disease management. 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 Referral was placed2/10/2018. Patient's last office visit was2/10/2018. CCM Team met with Charlotte Herrera face to face to establish health goals 08/11/2018. Today CCM RN CM followed up with Charlotte Herrera to discuss progression towards goals and to address concerns related to COVID-19.  Goals Addressed            This Visit's Progress   . COMPLETED: I am out of my metformin and the pharmacy says it is too soon to refill (pt-stated)       Charlotte Herrera reports she has now obtained her medications and taking as prescribed. She reports today's fasting blood glucose at 109. She denies hyperglycemic or hypoglycemic episodes. She is able to verbalize DM self care strategies and compliance with self care management.   Current Barriers:  . Film/video editor.  . Non-adherence to prescribed medication regimen . Difficulty obtaining medications  . Depression and Anxiety  Nurse Case Manager Clinical Goal(s):  [name] Kitchen Over the next 2 days, patient will work with [facility] Pharmacist to address needs related to obtaining insulin and metformin  Interventions:  . Advised patient to continue to take all medications as prescribed including her lantus 40u Q HS . Reviewed medications with patient and discussed importance of taking  medications as prescribed and not increasing lantus dose PRN . Collaborated with [name], [facility] Pharmacist regarding patients inability to obtain metformin and lantus secondary to pharmacy telling patient it is too soon to refill  Patient Self Care Activities:  . Self administers medications as prescribed  . Engage with CCM Clinic Pharmacist  Plan:  . RNCM will colloborate with [facility] Pharmacist and place patient call for tomorrow on Pharmacist schedule   Initial goal documentation     . COMPLETED: I have a lot of trouble sleeping (pt-stated)   On track    Charlotte Herrera admits to sleeping better recently. She has not started her Welbutrin but is utilizing strategies previously discussed. She continues to suffer from depression and anxiety. She has been offered counseling by [employer] [facility] but declines at this time.  Current Barriers:  . Depression and anxiety  . Grief  Pharmacist Clinical Goal(s): Goal revised 09/06/2018  Over the next 30 days, Charlotte Herrera will report utilization of sleep strategies discussed at office visit today.   Interventions: . Reinforced sleep hygiene strategies previously discussed, including lowering temperature of bedroom at night, using lavender essential oil, turning off electronics before bed, etc.   Patient Self Care Activities:  . Practice good sleep hygiene activities discussed in your appointment today  Please see past updates related to this goal by clicking on the "Past Updates" button in the selected goal      . I know I am very depressed (pt-stated)   Not on track    Current Barriers:  [name] Kitchen Knowledge deficit  related to understanding symptoms of depression . Knowledge deficit related to understanding how and when to take depression medication . Worry and anxiety related to COVID-19 pandemic  Nurse Case Manager Clinical Goal(s):   Over the next 30 days, patient will take Welbutrin as prescribed IN THE AM  Over the next 30  days, patient will continue to utilize sleep hygiene techniques discussed today  Over the next 30 days, patient will spend time daily outdoors as weather permits   Interventions:  . Active listening, provided emotional support and reassurance . Discussed current symptoms of depression patient is experiencing . Discussed importance of daily exercise as it relates to improving depression symptoms . Offered resource for grief counseling including introduction to CCM Social Work services . Reinforced sleep hygiene . Provided emotional support and reassurance related to current pandemic of COVID-19 . Discussed current CDC recommendations for social distancing and hand hygiene.   Patient Self Care Activities:  . Take welbutrin in the morning . Get plenty of sunshine . Remain engaged with family and friends . Try using lavendar essential oils for its calming effects . Incorporate exercise into daily schedule   Please see past updates in goals section for documentation of goal progression         Telephone follow up appointment with CCM team member scheduled for: 2 weeks     Mindee Robledo E. Rollene Rotunda, RN, BSN Nurse Care Coordinator [facility] Care Management  [phone]

## 2018-09-21 NOTE — Progress Notes (Signed)
This encounter was created in error - please disregard.

## 2018-09-21 NOTE — Patient Instructions (Signed)
Thank you allowing the Chronic Care Management Team to be a part of your care! It was a pleasure speaking with you today!  1. Please continue to utilize strategies to improve your sleep quality 2. Please call your provider if you are not feeling better by Friday 3. Take all medications as prescribed and PLEASE consider starting your Welbutrin for depression 4. PLEASE consider counseling for your depression. [place] Worker would be glad to counsel you. 5. Continue to check your blood sugars and report readings outside discussed parameters with Dr. Sanda Klein 6. Follow CDC precautions for symptom reporting, social distancing, and hand hygiene  Handwashing is one of the best ways to protect yourself and your family from getting sick. Wash Your Hands Often to Stay Healthy  When: You can help yourself and your loved ones stay healthy by washing your hands often, especially during these key times when you are likely to get and spread germs: . Before, during, and after preparing food  . Before eating food  . Before and after caring for someone at home who is sick with vomiting or diarrhea  . Before and after treating a cut or wound  . After using the toilet  . After being in public spaces . After changing diapers or cleaning up a child who has used the toilet  . After blowing your nose, coughing, or sneezing  . After touching an animal, animal feed, or animal waste  . After handling pet food or pet treats  . After touching garbage . After touching public doors, telephones, shopping carts, or other surfaces .  Five Steps to [place] the Right Way 1. Wet your hands with clean, running water (warm or cold), turn off the tap, and apply soap. 2. Lather your hands by rubbing them together with the soap. Lather the backs of your hands, between your fingers, and under your nails. 3. Scrub your hands for at least 20 seconds. Need a timer? Hum the "Happy Birthday" song from beginning to end  twice. 4. Rinse your hands well under clean, running water. 5. Dry your hands using a clean towel or air dry them.  Source: BridgeDigest.is  For information about COVID-19 or "Corona Virus", the following web resources may be helpful:  CDC: BeginnerSteps.be    [place]:  InsuranceIntern.se  CCM (Chronic Care Management) Team   Trish Fountain RN, BSN Nurse Care Coordinator  [phone]  [name] PharmD  Clinical Pharmacist  [phone]   [place], [place] Social Worker [phone]  Goals Addressed            This Visit's Progress   . COMPLETED: I am out of my metformin and the pharmacy says it is too soon to refill (pt-stated)       Current Barriers:  . Film/video editor.  . Non-adherence to prescribed medication regimen . Difficulty obtaining medications  . Depression and Anxiety  Nurse Case Manager Clinical Goal(s):  [name] Kitchen Over the next 2 days, patient will work with [facility] Pharmacist to address needs related to obtaining insulin and metformin  Interventions:  . Advised patient to continue to take all medications as prescribed including her lantus 40u Q HS . Reviewed medications with patient and discussed importance of taking medications as prescribed and not increasing lantus dose PRN . Collaborated with [name], [facility] Pharmacist regarding patients inability to obtain metformin and lantus secondary to pharmacy telling patient it is too soon to refill  Patient Self  Care Activities:  . Self administers medications as prescribed  . Engage with [facility] Pharmacist  Plan:  . RNCM will colloborate with [facility] Pharmacist and place patient call for tomorrow on Pharmacist schedule   Initial goal documentation     . COMPLETED: I have a lot of trouble  sleeping (pt-stated)   On track    Current Barriers:  . Depression and anxiety  . Grief   Pharmacist Clinical Goal(s): Goal revised 09/06/2018  Over the next 30 days, Ms. Wessels will report utilization of sleep strategies discussed at office visit today.   Interventions: . Reinforced sleep hygiene strategies previously discussed, including lowering temperature of bedroom at night, using lavender essential oil, turning off electronics before bed, etc.   Patient Self Care Activities:  . Practice good sleep hygiene activities discussed in your appointment today  Please see past updates related to this goal by clicking on the "Past Updates" button in the selected goal      . I know I am very depressed (pt-stated)   Not on track    Current Barriers:  [name] Kitchen Knowledge deficit related to understanding symptoms of depression . Knowledge deficit related to understanding how and when to take depression medication . Worry and anxiety related to COVID-19 pandemic  Nurse Case Manager Clinical Goal(s):   Over the next 30 days, patient will take Welbutrin as prescribed IN THE AM  Over the next 30 days, patient will continue to utilize sleep hygiene techniques discussed today  Over the next 30 days, patient will spend time daily outdoors as weather permits   Interventions:  . Active listening, provided emotional support and reassurance . Discussed current symptoms of depression patient is experiencing . Discussed importance of daily exercise as it relates to improving depression symptoms . Offered resource for grief counseling including introduction to CCM Social Work services . Reinforced sleep hygiene . Provided emotional support and reassurance related to current pandemic of COVID-19 . Discussed current CDC recommendations for social distancing and hand hygiene.   Patient Self Care Activities:  . Take welbutrin in the morning . Get plenty of sunshine . Remain engaged with family and  friends . Try using lavendar essential oils for its calming effects . Incorporate exercise into daily schedule   Please see past updates in goals section for documentation of goal progression        The patient verbalized understanding of instructions provided today and declined a print copy of patient instruction materials.   Telephone follow up appointment with CCM team member scheduled for: 2 weeks

## 2018-09-23 ENCOUNTER — Other Ambulatory Visit: Payer: Self-pay

## 2018-09-23 ENCOUNTER — Telehealth: Payer: Self-pay

## 2018-09-23 ENCOUNTER — Ambulatory Visit: Payer: Self-pay | Admitting: Pharmacist

## 2018-09-23 DIAGNOSIS — Z599 Problem related to housing and economic circumstances, unspecified: Secondary | ICD-10-CM

## 2018-09-23 DIAGNOSIS — IMO0002 Reserved for concepts with insufficient information to code with codable children: Secondary | ICD-10-CM

## 2018-09-23 DIAGNOSIS — E1165 Type 2 diabetes mellitus with hyperglycemia: Principal | ICD-10-CM

## 2018-09-23 DIAGNOSIS — E1142 Type 2 diabetes mellitus with diabetic polyneuropathy: Secondary | ICD-10-CM

## 2018-09-23 DIAGNOSIS — Z598 Other problems related to housing and economic circumstances: Secondary | ICD-10-CM

## 2018-09-23 NOTE — Chronic Care Management (AMB) (Signed)
  Chronic Care Management   Follow Up Note   09/23/2018 Name: Larae Caison MRN: [id number] DOB: 28-Jan-1946  Referred by: Arnetha Courser, MD Reason for referral : Chronic Care Management (Pharmacy follow up)   Charlotte Herrera is a 73 y.o. year old female who is a primary care patient of Lada, Satira Anis, MD. The CCM team was consulted for assistance with chronic disease management and care coordination needs.    Review of patient status, including review of consultants reports, relevant laboratory and other test results, and collaboration with appropriate care team members and the patient's provider was performed as part of comprehensive patient evaluation and provision of chronic care management services.     Assessment: Follow up to ensure patient was able to obtain medications, glucometer (see note 09/07/18). Patient verifies she has all medications. She has not picked up glucometer because she wants to consolidate trips to pharmacy/stores in light of COVID pandemic.   Provided counseling on COVID19, proper handwashing and other best practices.  Goals Addressed            This Visit's Progress   . "I need help affording my medication" (pt-stated)       Current Barriers:  . financial  Pharmacist Clinical Goal(s): Over the next 14 days, Ms.. Dack will provide the necessary supplementary documents (proof of out of pocket prescription expenditure, proof of household income) needed for medication assistance applications to CCM pharmacist.   Interventions: . CCM pharmacist will apply for medication assistance program for Lantus made by [employer]. . CCM pharmacist and Ms. Wilbon will reapply for Medicare Extra Help  . New glucometer and testing supplies covered by HTA  Updated:   New glucometer and testing supplies Nurse, adult) covered by [employer]  Collaboration with payor to obtain TROOP   Patient Self Care Activities:  [name] Kitchen Gather necessary documents needed to apply for medication  assistance  Please see past updates related to this goal by clicking on the "Past Updates" button in the selected goal          Plan: CCM pharmacist collaboration with payor to submit all documents needed for [employer] (Lantus) med assistance application.   The CM team will reach out to the patient again over the next 28 days.    [name], PharmD Clinical Pharmacist [facility] [phone]

## 2018-09-23 NOTE — Patient Instructions (Signed)
Goals Addressed            This Visit's Progress   . "I need help affording my medication" (pt-stated)       Current Barriers:  . financial  Pharmacist Clinical Goal(s): Over the next 14 days, Charlotte Herrera will provide the necessary supplementary documents (proof of out of pocket prescription expenditure, proof of household income) needed for medication assistance applications to CCM pharmacist.   Interventions: . CCM pharmacist will apply for medication assistance program for Lantus made by [employer]. . CCM pharmacist and Charlotte Herrera will reapply for Medicare Extra Help  . 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

## 2018-09-25 ENCOUNTER — Other Ambulatory Visit: Payer: Self-pay | Admitting: Family Medicine

## 2018-09-25 DIAGNOSIS — B372 Candidiasis of skin and nail: Secondary | ICD-10-CM

## 2018-09-26 ENCOUNTER — Encounter: Payer: Self-pay | Admitting: Family Medicine

## 2018-09-29 ENCOUNTER — Encounter: Payer: Self-pay | Admitting: Family Medicine

## 2018-09-29 ENCOUNTER — Telehealth: Payer: Self-pay | Admitting: Family Medicine

## 2018-09-29 ENCOUNTER — Ambulatory Visit: Payer: Self-pay

## 2018-09-29 DIAGNOSIS — E1142 Type 2 diabetes mellitus with diabetic polyneuropathy: Secondary | ICD-10-CM

## 2018-09-29 DIAGNOSIS — Z599 Problem related to housing and economic circumstances, unspecified: Secondary | ICD-10-CM

## 2018-09-29 DIAGNOSIS — F331 Major depressive disorder, recurrent, moderate: Secondary | ICD-10-CM

## 2018-09-29 DIAGNOSIS — IMO0002 Reserved for concepts with insufficient information to code with codable children: Secondary | ICD-10-CM

## 2018-09-29 DIAGNOSIS — E038 Other specified hypothyroidism: Secondary | ICD-10-CM

## 2018-09-29 DIAGNOSIS — E1165 Type 2 diabetes mellitus with hyperglycemia: Secondary | ICD-10-CM

## 2018-09-29 DIAGNOSIS — Z598 Other problems related to housing and economic circumstances: Secondary | ICD-10-CM

## 2018-09-29 NOTE — Telephone Encounter (Signed)
Faxed

## 2018-09-29 NOTE — Patient Instructions (Signed)
  Thank you allowing the Chronic Care Management Team to be a part of your care! It was a pleasure speaking with you today!  1. Your letter stating you are high risk for complication of [id number]-88 has been faxed 2. Please continue to take your Welbutrin every morning with food to prevent nausea 3. Get plenty of sunshine 4. Please discuss with Dr. Sanda Klein your concern for not being able to taste your food and request to see ENT 5. Continue to follow CDC guidelines for COVID-19 infection prevention  CCM (Chronic Care Management) Team   Trish Fountain RN, BSN Nurse Care Coordinator  [phone]  [name] PharmD  Clinical Pharmacist  [phone]   Elliot Gurney, LCSW Clinical Social Worker [phone]  Goals Addressed            This Visit's Progress   . I know I am very depressed (pt-stated)       Current Barriers:  [name] Kitchen Knowledge deficit related to understanding symptoms of depression . Knowledge deficit related to understanding how and when to take depression medication . Worry and anxiety related to COVID-19 pandemic  Nurse Case Manager Clinical Goal(s):   Over the next 30 days, patient will take Welbutrin as prescribed IN THE AM  Over the next 30 days, patient will continue to utilize sleep hygiene techniques discussed today  Over the next 30 days, patient will spend time daily outdoors as weather permits   Interventions:  . Active listening, provided emotional support and reassurance . Discussed current symptoms of depression patient is experiencing . Discussed importance of daily exercise as it relates to improving depression symptoms . Offered resource for grief counseling including introduction to CCM Social Work services . Reinforced sleep hygiene . Reinforced that it may take up to 4 weeks for patient to see improvement in her mood  Patient Self Care Activities:  . Take welbutrin in the morning . Get plenty of sunshine . Remain engaged with family and  friends . Try using lavendar essential oils for its calming effects . Incorporate exercise into daily schedule   Please see past updates in goals section for documentation of goal progression        The patient verbalized understanding of instructions provided today and declined a print copy of patient instruction materials.   Telephone follow up appointment with CCM team member scheduled for: 3 weeks Next PCP appointment scheduled for: 10/25/2018

## 2018-09-29 NOTE — Telephone Encounter (Signed)
I'll be glad to write letter Roselyn Reef, please fax Thank you

## 2018-09-29 NOTE — Telephone Encounter (Signed)
-----   Message from Benedetto Goad, RN sent at 09/29/2018  2:34 PM EDT ----- Regarding: COVID-19 high risk letter Hi Dr. Sanda Klein,  I just received an incoming call from Ms. Charlotte Herrera DOB Jul 28, 2045 requesting a "high risk status" letter related to COVID-19. 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 [name] states she was given a letter stating she was eligible for monetary compensation if she was high risk. She confirmed this information with "Lorrie" at [place] office. Would you please consider writing a letter confirming her high risk status?  It can be faxed to [phone] Attn: Lorrie  Thank you for your consideration,  Portia E. Rollene Rotunda, RN, BSN Nurse Care Coordinator [facility] Care Management  [phone]

## 2018-09-29 NOTE — Chronic Care Management (AMB) (Signed)
  Chronic Care Management   Note  09/29/2018 Name: Charlotte Herrera MRN: [id number] DOB: [birth year]  Care Coordination: Incoming telephone call from [name], 73 year old female patient of Dr. Enid Derry who continues to engage with CCM RN CM and [facility] Pharmacist for ongoing assessment and evaluation of health needs and goals. [name] is requesting physician signed letter stating she is "high risk" for SFKCL-27 infection complications. She is requesting this letter secondary to being "laid off" from her part-time work with [employer] [place] collaborated with Dr. Sanda Klein who provided patient with high risk letter and faxed to [phone] Attn: Lorrie per patient request. Patient was notified and grateful for assistance.  Of note, patient states she has restarted her Welbutrin for depression, but discouraged because she has not seen a benefit. She has only been taking her Welbutrin for 2 days.   Goals    . I know I am very depressed (pt-stated)     Current Barriers:  [name] Kitchen Knowledge deficit related to understanding symptoms of depression . Knowledge deficit related to understanding how and when to take depression medication . Worry and anxiety related to COVID-19 pandemic  Nurse Case Manager Clinical Goal(s):   Over the next 30 days, patient will take Welbutrin as prescribed IN THE AM  Over the next 30 days, patient will continue to utilize sleep hygiene techniques discussed today  Over the next 30 days, patient will spend time daily outdoors as weather permits   Interventions:  . Active listening, provided emotional support and reassurance . Discussed current symptoms of depression patient is experiencing . Discussed importance of daily exercise as it relates to improving depression symptoms . Offered resource for grief counseling including introduction to CCM Social Work services . Reinforced sleep hygiene . Reinforced that it may take up to 4 weeks for patient to see  improvement in her mood  Patient Self Care Activities:  . Take welbutrin in the morning . Get plenty of sunshine . Remain engaged with family and friends . Try using lavendar essential oils for its calming effects . Incorporate exercise into daily schedule   Please see past updates in goals section for documentation of goal progression        Telephone follow up appointment with CCM team member scheduled for: 3 weeks Next PCP appointment scheduled for: 10/25/2018    Ramisa Duman E. Rollene Rotunda, RN, BSN Nurse Care Coordinator [facility] Care Management  [phone]

## 2018-10-10 ENCOUNTER — Ambulatory Visit: Payer: Self-pay

## 2018-10-10 DIAGNOSIS — F331 Major depressive disorder, recurrent, moderate: Secondary | ICD-10-CM

## 2018-10-10 DIAGNOSIS — E1142 Type 2 diabetes mellitus with diabetic polyneuropathy: Secondary | ICD-10-CM

## 2018-10-10 DIAGNOSIS — F411 Generalized anxiety disorder: Secondary | ICD-10-CM

## 2018-10-10 DIAGNOSIS — I129 Hypertensive chronic kidney disease with stage 1 through stage 4 chronic kidney disease, or unspecified chronic kidney disease: Secondary | ICD-10-CM

## 2018-10-10 DIAGNOSIS — E1165 Type 2 diabetes mellitus with hyperglycemia: Principal | ICD-10-CM

## 2018-10-10 DIAGNOSIS — E1122 Type 2 diabetes mellitus with diabetic chronic kidney disease: Secondary | ICD-10-CM

## 2018-10-10 DIAGNOSIS — IMO0002 Reserved for concepts with insufficient information to code with codable children: Secondary | ICD-10-CM

## 2018-10-10 NOTE — Chronic Care Management (AMB) (Signed)
  Chronic Care Management   Follow Up Note   10/10/2018 Name: Charlotte Herrera MRN: [id number] DOB: August 15, 1945  Referred by: Arnetha Courser, MD Reason for referral : Chronic Care Management (incoming call)   Subjective: "I am still taking that depression pill and I think I am feeling a little better especially since the stress at work is gone"   Objective:  Assessment: Charlotte Herrera a 73year old [place] primary care. Dr. Dirk Dress the CCM team to consult the patient formedication affordability and chronic disease management. 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 Referral was placed2/10/2018. Patient's last office visit was2/10/2018. CCM Team met with Charlotte Herrera face to face to establish health goals2/13/2020.   Today CCM RN CM received an incoming call from Charlotte Herrera stating she needs to reschedule her upcoming appointment with Dr. Sanda Klein secondary to COVID-19 restrictions. Charlotte Herrera was instructed to call and given the main office number. CCM RN CM took the opportunity to discuss patients depression symptoms and review health goals with patient.  Goals Addressed            This Visit's Progress   . I know I am very depressed (pt-stated)   On track    Charlotte Herrera states she is doing well. She continues to take her Welbutrin each morning. She states she is not as "stressed" now that she is not working. She is trying to get a little sunshine each day and believes she is "feeling a little better". She is taking trazodone at night time which is helping her insomnia. She continues to refuse [place] counseling.   Current Barriers:  [name] Kitchen Knowledge deficit related to understanding symptoms of depression . Knowledge deficit related to understanding how and when to take depression medication . Worry and anxiety related to COVID-19 pandemic  Nurse Case Manager Clinical Goal(s):   Over the  next 30 days, patient will take Welbutrin as prescribed IN THE AM  Over the next 30 days, patient will continue to utilize sleep hygiene techniques discussed today  Over the next 30 days, patient will spend time daily outdoors as weather permits   Interventions:  . Active listening, provided emotional support and reassurance . Discussed current symptoms of depression patient is experiencing . Discussed importance of daily exercise as it relates to improving depression symptoms . Offered resource for grief counseling including introduction to CCM Social Work services . Reinforced sleep hygiene . Reinforced that it may take up to 4 weeks for patient to see improvement in her mood  Patient Self Care Activities:  . Take welbutrin in the morning . Get plenty of sunshine . Remain engaged with family and friends . Try using lavendar essential oils for its calming effects . Incorporate exercise into daily schedule   Please see past updates in goals section for documentation of goal progression         Telephone follow up appointment with CCM team member scheduled for: 10/20/2018 as previously scheduled    Tomika Eckles E. Rollene Rotunda, RN, BSN Nurse Care Coordinator [facility] Care Management  [phone]

## 2018-10-11 NOTE — Patient Instructions (Signed)
  Thank you allowing the Chronic Care Management Team to be a part of your care! It was a pleasure speaking with you today!  1. Please continue to utilize strategies to improve your sleep quality 2. Please call your provider if you are not feeling better by Friday 3. Take all medications as prescribed and PLEASE consider starting your Welbutrin for depression 4. PLEASE consider counseling for your depression. [place] Worker would be glad to counsel you.  CCM (Chronic Care Management) Team   Trish Fountain RN, BSN Nurse Care Coordinator  [phone]  [name] PharmD  Clinical Pharmacist  [phone]   Elliot Gurney, LCSW Clinical Social Worker [phone]  Goals Addressed            This Visit's Progress   . I know I am very depressed (pt-stated)   On track    Current Barriers:  [name] Kitchen Knowledge deficit related to understanding symptoms of depression . Knowledge deficit related to understanding how and when to take depression medication . Worry and anxiety related to COVID-19 pandemic  Nurse Case Manager Clinical Goal(s):   Over the next 30 days, patient will take Welbutrin as prescribed IN THE AM  Over the next 30 days, patient will continue to utilize sleep hygiene techniques discussed today  Over the next 30 days, patient will spend time daily outdoors as weather permits   Interventions:  . Active listening, provided emotional support and reassurance . Discussed current symptoms of depression patient is experiencing . Discussed importance of daily exercise as it relates to improving depression symptoms . Offered resource for grief counseling including introduction to CCM Social Work services . Reinforced sleep hygiene . Reinforced that it may take up to 4 weeks for patient to see improvement in her mood  Patient Self Care Activities:  . Take welbutrin in the morning . Get plenty of sunshine . Remain engaged with family and friends . Try using  lavendar essential oils for its calming effects . Incorporate exercise into daily schedule   Please see past updates in goals section for documentation of goal progression        The patient verbalized understanding of instructions provided today and declined a print copy of patient instruction materials.   Telephone follow up appointment with CCM team member scheduled for: 10/20/2018 as previously scheduled

## 2018-10-12 ENCOUNTER — Ambulatory Visit: Payer: Self-pay | Admitting: Pharmacist

## 2018-10-12 DIAGNOSIS — IMO0002 Reserved for concepts with insufficient information to code with codable children: Secondary | ICD-10-CM

## 2018-10-12 DIAGNOSIS — E1142 Type 2 diabetes mellitus with diabetic polyneuropathy: Secondary | ICD-10-CM

## 2018-10-12 DIAGNOSIS — F331 Major depressive disorder, recurrent, moderate: Secondary | ICD-10-CM

## 2018-10-12 DIAGNOSIS — E1165 Type 2 diabetes mellitus with hyperglycemia: Principal | ICD-10-CM

## 2018-10-12 NOTE — Chronic Care Management (AMB) (Signed)
  Chronic Care Management   Follow Up Note   10/12/2018 Name: Maryjayne Kleven MRN: [id number] DOB: [birth year]  Referred by: Arnetha Courser, MD Reason for referral : Care Coordination (Medication assistance)   Jamey Demchak is a 73 y.o. year old female who is a primary care patient of Lada, Satira Anis, MD. The CCM team was consulted for assistance with chronic disease management and care coordination needs.    Review of patient status, including review of consultants reports, relevant laboratory and other test results, and collaboration with appropriate care team members and the patient's provider was performed as part of comprehensive patient evaluation and provision of chronic care management services.    Goals Addressed            This Visit's Progress   . "I need help affording my medication" (pt-stated)       Current Barriers:  . financial  Pharmacist Clinical Goal(s): Over the next 14 days, Ms.. Harty will provide the necessary supplementary documents (proof of out of pocket prescription expenditure, proof of household income) needed for medication assistance applications to CCM pharmacist.   Interventions: . CCM pharmacist will apply for medication assistance program for Lantus made by [employer]. . CCM pharmacist and Ms. Mato will reapply for Medicare Extra Help  . New glucometer and testing supplies covered by HTA  Updated:   New glucometer and testing supplies Nurse, adult) covered by [employer]  Collaboration with payor to obtain TROOP   Received patient's household income, need patient's signed application (mailed)  Patient Self Care Activities:  [name] Kitchen Gather necessary documents needed to apply for medication assistance  Please see past updates related to this goal by clicking on the "Past Updates" button in the selected goal           Telephone follow up appointment with CCM team member scheduled for: 2 weeks, telephone with PharmD Follow up with provider re: [employer]  med assist- need provider signature   [name], PharmD Clinical Pharmacist [place] [phone]

## 2018-10-12 NOTE — Patient Instructions (Signed)
Goals Addressed            This Visit's Progress   . "I need help affording my medication" (pt-stated)       Current Barriers:  . financial  Pharmacist Clinical Goal(s): Over the next 14 days, Ms.. Demeyer will provide the necessary supplementary documents (proof of out of pocket prescription expenditure, proof of household income) needed for medication assistance applications to CCM pharmacist.   Interventions: . CCM pharmacist will apply for medication assistance program for Lantus made by [employer]. . CCM pharmacist and Ms. Funari will reapply for Medicare Extra Help  . New glucometer and testing supplies covered by HTA  Updated:   New glucometer and testing supplies Nurse, adult) covered by [employer]  Collaboration with payor to obtain TROOP   Received patient's household income, need patient's signed application (mailed)  Patient Self Care Activities:  [name] Kitchen Gather necessary documents needed to apply for medication assistance  Please see past updates related to this goal by clicking on the "Past Updates" button in the selected goal          The patient verbalized understanding of instructions provided today and declined a print copy of patient instruction materials.

## 2018-10-20 ENCOUNTER — Telehealth: Payer: Self-pay

## 2018-10-21 ENCOUNTER — Telehealth: Payer: Self-pay

## 2018-10-21 ENCOUNTER — Ambulatory Visit: Payer: Self-pay

## 2018-10-21 DIAGNOSIS — F411 Generalized anxiety disorder: Secondary | ICD-10-CM

## 2018-10-21 DIAGNOSIS — E1122 Type 2 diabetes mellitus with diabetic chronic kidney disease: Secondary | ICD-10-CM

## 2018-10-21 DIAGNOSIS — IMO0002 Reserved for concepts with insufficient information to code with codable children: Secondary | ICD-10-CM

## 2018-10-21 DIAGNOSIS — E1142 Type 2 diabetes mellitus with diabetic polyneuropathy: Secondary | ICD-10-CM

## 2018-10-21 DIAGNOSIS — I129 Hypertensive chronic kidney disease with stage 1 through stage 4 chronic kidney disease, or unspecified chronic kidney disease: Secondary | ICD-10-CM

## 2018-10-21 DIAGNOSIS — E1165 Type 2 diabetes mellitus with hyperglycemia: Principal | ICD-10-CM

## 2018-10-21 NOTE — Chronic Care Management (AMB) (Signed)
   Chronic Care Management   Unsuccessful Call Note 10/21/2018 Name: Charlotte Herrera MRN: [id number] DOB: June 05, 1946    [name] a 73year old femalewho seesDr.Melinda Ladafor primary care. Dr. Dirk Dress the CCM team to consult the patient formedication affordability and chronic disease management. 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 Referral was placed2/10/2018. Patient's last office visit was2/10/2018. CCM Team met with [name] face to face to establish health goals2/13/2020 and has been following [name] since. Today CCM RN CM attempted to follow up with [name] to discuss her ongoing depression symptoms and progression towards goals.    Was unable to reach patient via telephone. I have left HIPAA compliant voicemail asking patient to return my call. (unsuccessful outreach #1).   Plan: Will follow-up within 7 business days via telephone.       Milbern Doescher E. Rollene Rotunda, RN, BSN Nurse Care Coordinator [facility] Care Management  [phone]

## 2018-10-25 ENCOUNTER — Ambulatory Visit (INDEPENDENT_AMBULATORY_CARE_PROVIDER_SITE_OTHER): Payer: PPO | Admitting: Pharmacist

## 2018-10-25 ENCOUNTER — Telehealth: Payer: Self-pay

## 2018-10-25 ENCOUNTER — Ambulatory Visit: Payer: Self-pay | Admitting: Family Medicine

## 2018-10-25 ENCOUNTER — Other Ambulatory Visit: Payer: Self-pay

## 2018-10-25 DIAGNOSIS — IMO0002 Reserved for concepts with insufficient information to code with codable children: Secondary | ICD-10-CM

## 2018-10-25 DIAGNOSIS — E1165 Type 2 diabetes mellitus with hyperglycemia: Secondary | ICD-10-CM | POA: Diagnosis not present

## 2018-10-25 DIAGNOSIS — I129 Hypertensive chronic kidney disease with stage 1 through stage 4 chronic kidney disease, or unspecified chronic kidney disease: Secondary | ICD-10-CM | POA: Diagnosis not present

## 2018-10-25 DIAGNOSIS — E1142 Type 2 diabetes mellitus with diabetic polyneuropathy: Secondary | ICD-10-CM

## 2018-10-25 DIAGNOSIS — Z598 Other problems related to housing and economic circumstances: Secondary | ICD-10-CM

## 2018-10-25 DIAGNOSIS — E1122 Type 2 diabetes mellitus with diabetic chronic kidney disease: Secondary | ICD-10-CM | POA: Diagnosis not present

## 2018-10-25 DIAGNOSIS — Z599 Problem related to housing and economic circumstances, unspecified: Secondary | ICD-10-CM

## 2018-10-25 NOTE — Patient Instructions (Signed)
Goals Addressed            This Visit's Progress   . "I need help affording my medication" (pt-stated)       Current Barriers:  . financial  Pharmacist Clinical Goal(s): Over the next 14 days, Ms.. Blyden will provide the necessary supplementary documents (proof of out of pocket prescription expenditure, proof of household income) needed for medication assistance applications to CCM pharmacist.   Interventions: . CCM pharmacist will apply for medication assistance program for Lantus made by [employer]. . CCM pharmacist and Ms. Sandora will reapply for Medicare Extra Help- completed, patient has LIS level 4 . 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 I know I am very depressed (pt-stated)       Current Barriers:  [name] Kitchen Knowledge deficit related to understanding symptoms of depression . Knowledge deficit related to understanding how and when to take depression medication . Worry and anxiety related to COVID-19 pandemic  Nurse Case Manager Clinical Goal(s):   Over the next 30 days, patient will take Welbutrin as prescribed IN THE AM  Over the next 30 days, patient will continue to utilize sleep hygiene techniques discussed today  Over the next 30 days, patient will spend time daily outdoors as weather permits   Interventions:  . Active listening, provided emotional support and reassurance . Discussed current symptoms of depression patient is experiencing . Discussed importance of daily exercise  as it relates to improving depression symptoms . Offered resource for grief counseling including introduction to CCM Social Work services- Patient consented to [facility] 10/25/18 . Reinforced sleep hygiene . Reinforced that it may take up to 4 weeks for patient to see improvement in her mood  Patient Self Care Activities:  . Take welbutrin in the morning . Get plenty of sunshine . Remain engaged with family and friends . Try using lavendar essential oils for its calming effects . Incorporate exercise into daily schedule   Please see past updates in goals section for documentation of goal progression        The patient verbalized understanding of instructions provided today and declined a print copy of patient instruction materials.

## 2018-10-25 NOTE — Chronic Care Management (AMB) (Signed)
  Chronic Care Management   Follow Up Note   10/25/2018 Name: Charlotte Herrera MRN: [id number] DOB: [birth year]  Referred by: Arnetha Courser, MD Reason for referral : Chronic Care Management (Pharmacy follow up)   Charlotte Herrera is a 73 y.o. year old female who is a primary care patient of Lada, Satira Anis, MD. The CCM team was consulted for assistance with chronic disease management and care coordination needs.    Review of patient status, including review of consultants reports, relevant laboratory and other test results, and collaboration with appropriate care team members and the patient's provider was performed as part of comprehensive patient evaluation and provision of chronic care management services.    Goals Addressed            This Visit's Progress   . "I need help affording my medication" (pt-stated)       Current Barriers:  . financial  Pharmacist Clinical Goal(s): Over the next 14 days, Ms.. Haggart will provide the necessary supplementary documents (proof of out of pocket prescription expenditure, proof of household income) needed for medication assistance applications to CCM pharmacist.   Interventions: . CCM pharmacist will apply for medication assistance program for Lantus made by [employer]. . CCM pharmacist and [name] will reapply for Medicare Extra Help- completed, patient has LIS level 4 . 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 I know I am very depressed (pt-stated)        Current Barriers:  [name] Kitchen Knowledge deficit related to understanding symptoms of depression . Knowledge deficit related to understanding how and when to take depression medication . Worry and anxiety related to COVID-19 pandemic  Nurse Case Manager Clinical Goal(s):   Over the next 30 days, patient will take Welbutrin as prescribed IN THE AM  Over the next 30 days, patient will continue to utilize sleep hygiene techniques discussed today  Over the next 30 days, patient will spend time daily outdoors as weather permits   Interventions:  . Active listening, provided emotional support and reassurance . Discussed current symptoms of depression patient is experiencing . Discussed importance of daily exercise as it relates to improving depression symptoms . Offered resource for grief counseling including introduction to CCM Social Work services- Patient consented to [facility] 10/25/18 . Reinforced sleep hygiene . Reinforced that it may take up to 4 weeks for patient to see improvement in her mood  Patient Self Care Activities:  . Take welbutrin in the morning . Get plenty of sunshine . Remain engaged with family and friends . Try using lavendar essential oils for its calming effects . Incorporate exercise into daily schedule   Please see past updates in goals section for documentation of goal progression         Telephone follow up appointment with CCM team member scheduled for: 14 days with PharmD or sooner pending communication from [employer] regarding medication assistance application  [name], PharmD Clinical Pharmacist [place] [phone]

## 2018-10-26 ENCOUNTER — Other Ambulatory Visit: Payer: Self-pay

## 2018-10-26 ENCOUNTER — Ambulatory Visit: Payer: Self-pay

## 2018-10-26 DIAGNOSIS — I129 Hypertensive chronic kidney disease with stage 1 through stage 4 chronic kidney disease, or unspecified chronic kidney disease: Secondary | ICD-10-CM

## 2018-10-26 DIAGNOSIS — E1165 Type 2 diabetes mellitus with hyperglycemia: Principal | ICD-10-CM

## 2018-10-26 DIAGNOSIS — E1142 Type 2 diabetes mellitus with diabetic polyneuropathy: Secondary | ICD-10-CM | POA: Diagnosis not present

## 2018-10-26 DIAGNOSIS — E1122 Type 2 diabetes mellitus with diabetic chronic kidney disease: Secondary | ICD-10-CM

## 2018-10-26 DIAGNOSIS — Z598 Other problems related to housing and economic circumstances: Secondary | ICD-10-CM

## 2018-10-26 DIAGNOSIS — IMO0002 Reserved for concepts with insufficient information to code with codable children: Secondary | ICD-10-CM

## 2018-10-26 DIAGNOSIS — Z599 Problem related to housing and economic circumstances, unspecified: Secondary | ICD-10-CM

## 2018-10-26 NOTE — Chronic Care Management (AMB) (Deleted)
  Chronic Care Management   Follow Up Note   10/26/2018 Name: Charlotte Herrera MRN: [id number] DOB: 1946-03-28  Referred by: Arnetha Courser, MD Reason for referral : Chronic Care Management (follow up depression)   Subjective: "I really need to talk to a SW about resources for food and cheaper housing"   Objective:  Assessment: (brief patient description)  Review of patient status, including review of consultants reports, relevant laboratory and other test results, and collaboration with appropriate care team members and the patient's provider was performed as part of comprehensive patient evaluation and provision of chronic care management services.    Goals Addressed            This Visit's Progress   . I know I am very depressed (pt-stated)       Current Barriers:  [name] Kitchen Knowledge deficit related to understanding symptoms of depression . Knowledge deficit related to understanding how and when to take depression medication . Worry and anxiety related to COVID-19 pandemic and lack of additional income . Financial insecurities  Nurse Case Manager Clinical Goal(s):   Over the next 30 days, patient will take Welbutrin as prescribed IN THE AM  Over the next 30 days, patient will continue to utilize sleep hygiene techniques discussed today  Over the next 30 days, patient will spend time daily outdoors as weather permits   Over the next 14 days, patient will engage with [place] to address social needs and depression symptoms  Interventions:  . Active listening, provided emotional support and reassurance . Discussed current symptoms of depression patient is experiencing . Discussed importance of daily exercise as it relates to improving depression symptoms . Offered resource for grief counseling including introduction to CCM Social Work services- Patient consented to [facility] 10/25/18 . Reinforced sleep hygiene  Reinforced that it may take up to 4 weeks for patient to see  improvement in her mood  Collaboration with [place] and referral placed  Patient Self Care Activities:  . Take welbutrin in the morning . Get plenty of sunshine . Remain engaged with family and friends . Try using lavendar essential oils for its calming effects . Incorporate exercise into daily schedule . Engages with [place]   Please see past updates in goals section for documentation of goal progression         {CCM FOLLOW UP PLAN:22241}   SIGNATURE

## 2018-10-26 NOTE — Chronic Care Management (AMB) (Signed)
Chronic Care Management   Follow Up Note   10/26/2018 Name: Charlotte Herrera MRN: [id number] DOB: 20-Dec-1945  Referred by: Arnetha Courser, MD Reason for referral : Chronic Care Management (follow up depression)   Subjective: "I really need to talk to a SW about resources for food and cheaper housing"   Objective:  Assessment: Charlotte Herrera a 73year old femalewho seesDr.Melinda Ladafor primary care. Dr. Dirk Dress the CCM team to consult the patient formedication affordability and chronic disease management. 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 Referral was placed2/10/2018. Patient's last office visit was2/10/2018. CCM Team met with Charlotte Herrera face to face to establish health goals2/13/2020 and has been following Charlotte Herrera since. Today CCM RN CM followed up with Charlotte Herrera to discuss her ongoing depression symptoms and progression towards goals.   Review of patient status, including review of consultants reports, relevant laboratory and other test results, and collaboration with appropriate care team members and the patient's provider was performed as part of comprehensive patient evaluation and provision of chronic care management services.    Goals Addressed            This Visit's Progress   . I know I am very depressed (pt-stated)       Charlotte Herrera reports continued stress and anxiety related to her loss of income from COVID-19 restrictions. She recieves 1200.00/month from her Medicare. She was working a part-time job for the [employer] but now that job has ended because of closure from [employer]. She is struggling to pay her utilities and obtain groceries. Does not have an immediate need today. She suffers chronic depression secondary to grieving the loss of her daughter. She recently began taking Welbutrin again and is seeing some positive results. She feels if she could get her financial situation under  control she would be in a much better place.  Current Barriers:  [name] Kitchen Knowledge deficit related to understanding symptoms of depression . Knowledge deficit related to understanding how and when to take depression medication . Worry and anxiety related to COVID-19 pandemic and lack of additional income . Financial insecurities  Nurse Case Manager Clinical Goal(s):   Over the next 30 days, patient will take Welbutrin as prescribed IN THE AM  Over the next 30 days, patient will continue to utilize sleep hygiene techniques discussed today  Over the next 30 days, patient will spend time daily outdoors as weather permits   Over the next 14 days, patient will engage with [place] to address social needs and depression symptoms  Interventions:  . Active listening, provided emotional support and reassurance . Discussed current symptoms of depression patient is experiencing . Discussed importance of daily exercise as it relates to improving depression symptoms . Offered resource for grief counseling including introduction to CCM Social Work services- Patient consented to [facility] 10/25/18 . Reinforced sleep hygiene  Reinforced that it may take up to 4 weeks for patient to see improvement in her mood  Collaboration with [place] and referral placed  Patient Self Care Activities:  . Take welbutrin in the morning . Get plenty of sunshine . Remain engaged with family and friends . Try using lavendar essential oils for its calming effects . Incorporate exercise into daily schedule . Engages with [place]   Please see past updates in goals section for documentation of goal progression         Telephone  follow up appointment with CCM team member scheduled for: 1 week with [place]  Patient's care will be transferred to [place]. She was assessed today and no additional health needs from CCM RN CM at this time. She remains engaged with [facility] Pharmacist at  this time.     Vaiden Adames E. Rollene Rotunda, RN, BSN Nurse Care Coordinator [facility] Care Management  [phone]

## 2018-10-27 ENCOUNTER — Other Ambulatory Visit: Payer: Self-pay | Admitting: Family Medicine

## 2018-10-27 NOTE — Patient Instructions (Addendum)
  Thank you allowing the Chronic Care Management Team to be a part of your care! It was a pleasure speaking with you today!  1. Please continue to utilize strategies to improve your sleep quality 2. Please continue to take all your medications including your Welbutrin. I am happy you are feeling a little better. 3. I have referred you to [place] and she will give you a call over the next 7 days.  CCM (Chronic Care Management) Team   Trish Fountain RN, BSN Nurse Care Coordinator  [phone]  [name] PharmD  Clinical Pharmacist  [phone]   Elliot Gurney, LCSW Clinical Social Worker [phone]  Goals Addressed            This Visit's Progress   . I know I am very depressed (pt-stated)       Current Barriers:  [name] Kitchen Knowledge deficit related to understanding symptoms of depression . Knowledge deficit related to understanding how and when to take depression medication . Worry and anxiety related to COVID-19 pandemic and lack of additional income . Financial insecurities  Nurse Case Manager Clinical Goal(s):   Over the next 30 days, patient will take Welbutrin as prescribed IN THE AM  Over the next 30 days, patient will continue to utilize sleep hygiene techniques discussed today  Over the next 30 days, patient will spend time daily outdoors as weather permits   Over the next 14 days, patient will engage with [place] to address social needs and depression symptoms  Interventions:  . Active listening, provided emotional support and reassurance . Discussed current symptoms of depression patient is experiencing . Discussed importance of daily exercise as it relates to improving depression symptoms . Offered resource for grief counseling including introduction to CCM Social Work services- Patient consented to [facility] 10/25/18 . Reinforced sleep hygiene  Reinforced that it may take up to 4 weeks for patient to see improvement in her  mood  Collaboration with [place] and referral placed  Patient Self Care Activities:  . Take welbutrin in the morning . Get plenty of sunshine . Remain engaged with family and friends . Try using lavendar essential oils for its calming effects . Incorporate exercise into daily schedule . Engages with [place]   Please see past updates in goals section for documentation of goal progression        The patient verbalized understanding of instructions provided today and declined a print copy of patient instruction materials.   Telephone follow up appointment with CCM team member scheduled for: 1 week

## 2018-10-31 ENCOUNTER — Other Ambulatory Visit: Payer: Self-pay

## 2018-10-31 ENCOUNTER — Telehealth: Payer: Self-pay

## 2018-10-31 ENCOUNTER — Other Ambulatory Visit: Payer: Self-pay | Admitting: Nurse Practitioner

## 2018-10-31 DIAGNOSIS — K7469 Other cirrhosis of liver: Secondary | ICD-10-CM | POA: Diagnosis not present

## 2018-10-31 DIAGNOSIS — E538 Deficiency of other specified B group vitamins: Secondary | ICD-10-CM

## 2018-10-31 DIAGNOSIS — E038 Other specified hypothyroidism: Secondary | ICD-10-CM

## 2018-10-31 DIAGNOSIS — Z5181 Encounter for therapeutic drug level monitoring: Secondary | ICD-10-CM | POA: Diagnosis not present

## 2018-10-31 DIAGNOSIS — E559 Vitamin D deficiency, unspecified: Secondary | ICD-10-CM | POA: Diagnosis not present

## 2018-10-31 DIAGNOSIS — E1165 Type 2 diabetes mellitus with hyperglycemia: Secondary | ICD-10-CM | POA: Diagnosis not present

## 2018-10-31 DIAGNOSIS — IMO0002 Reserved for concepts with insufficient information to code with codable children: Secondary | ICD-10-CM

## 2018-10-31 DIAGNOSIS — E1142 Type 2 diabetes mellitus with diabetic polyneuropathy: Secondary | ICD-10-CM | POA: Diagnosis not present

## 2018-10-31 LAB — VITAMIN B12: Vitamin B-12: 305

## 2018-10-31 LAB — HEMOGLOBIN A1C: Hemoglobin A1C: 8.3

## 2018-10-31 NOTE — Telephone Encounter (Signed)
Thank you! Added on A1C

## 2018-10-31 NOTE — Addendum Note (Signed)
Addended by: Docia Furl on: 10/31/2018 10:54 AM   Modules accepted: Orders

## 2018-10-31 NOTE — Telephone Encounter (Signed)
I released labs for her to go to [facility], there was no A1c? Does she need this checked?

## 2018-10-31 NOTE — Telephone Encounter (Signed)
added

## 2018-11-01 ENCOUNTER — Encounter: Payer: Self-pay | Admitting: Nurse Practitioner

## 2018-11-01 ENCOUNTER — Other Ambulatory Visit: Payer: Self-pay

## 2018-11-01 ENCOUNTER — Other Ambulatory Visit: Payer: Self-pay | Admitting: Family Medicine

## 2018-11-01 ENCOUNTER — Ambulatory Visit (INDEPENDENT_AMBULATORY_CARE_PROVIDER_SITE_OTHER): Payer: PPO | Admitting: Nurse Practitioner

## 2018-11-01 VITALS — Temp 98.4°F

## 2018-11-01 DIAGNOSIS — F331 Major depressive disorder, recurrent, moderate: Secondary | ICD-10-CM | POA: Diagnosis not present

## 2018-11-01 DIAGNOSIS — Z5181 Encounter for therapeutic drug level monitoring: Secondary | ICD-10-CM | POA: Diagnosis not present

## 2018-11-01 DIAGNOSIS — E038 Other specified hypothyroidism: Secondary | ICD-10-CM

## 2018-11-01 DIAGNOSIS — I700 Atherosclerosis of aorta: Secondary | ICD-10-CM

## 2018-11-01 DIAGNOSIS — Z598 Other problems related to housing and economic circumstances: Secondary | ICD-10-CM

## 2018-11-01 DIAGNOSIS — K7469 Other cirrhosis of liver: Secondary | ICD-10-CM

## 2018-11-01 DIAGNOSIS — E1142 Type 2 diabetes mellitus with diabetic polyneuropathy: Secondary | ICD-10-CM | POA: Diagnosis not present

## 2018-11-01 DIAGNOSIS — Z599 Problem related to housing and economic circumstances, unspecified: Secondary | ICD-10-CM

## 2018-11-01 LAB — HEPATIC FUNCTION PANEL
ALT: 22 (ref 7–35)
AST: 28 (ref 13–35)
Alkaline Phosphatase: 67 (ref 25–125)

## 2018-11-01 LAB — BASIC METABOLIC PANEL
BUN: 18 (ref 4–21)
Creatinine: 1 (ref 0.5–1.1)
Glucose: 149
Potassium: 4.5 (ref 3.4–5.3)
Sodium: 140 (ref 137–147)

## 2018-11-01 LAB — VITAMIN D 25 HYDROXY (VIT D DEFICIENCY, FRACTURES): Vit D, 25-Hydroxy: 28.4

## 2018-11-01 MED ORDER — PRAVASTATIN SODIUM 20 MG PO TABS: 20.0000 mg | ORAL_TABLET | Freq: Every day | ORAL | 3 refills | Status: AC

## 2018-11-01 MED ORDER — LEVOTHYROXINE SODIUM 50 MCG PO TABS: 50.0000 ug | ORAL_TABLET | Freq: Every day | ORAL | 1 refills | Status: DC

## 2018-11-01 NOTE — Progress Notes (Signed)
Virtual Visit via Telephone Note  I connected with Charlotte Herrera on 11/01/18 at 11:20 AM EDT by telephone and verified that I am speaking with the correct person using two identifiers.   Staff discussed the limitations, risks, security and privacy concerns of performing an evaluation and management service by telephone and the availability of in person appointments. Staff also discussed with the patient that there may be a patient responsible charge related to this service. The patient expressed understanding and agreed to proceed.  Patients location: home  My location: home office  Other people in meeting: none    HPI Hypertension Losartan 74m BP Readings from Last 3 Encounters:  07/07/18 128/68  05/31/18 130/60  05/05/18 120/66   Diabetes Mellitus rx metformin 10043mBID, lantus 42 units daily.  CBG 145 this morning has been higher lately since she has been out of work  Lab Results  Component Value Date   HGBA1C 6.3 (H) 04/26/2018    Hyperlipidemia  rx pravastatin 2011mt bedtime Lab Results  Component Value Date   CHOL 154 04/26/2018   HDL 42 04/26/2018   LDLCALC 70 04/26/2018   TRIG 211 (H) 04/26/2018   CHOLHDL 3.7 04/26/2018   Hypothyroidism Synthroid 50m76maily without missed doses, has been on stable dose for last 2 years   Lab Results  Component Value Date   TSH 1.04 08/03/2017    [facility] Mild nodular hepatic contour, probably related to cirrhosis. Last LFTs were WNL, will add on CMP to labwork drawn yesterday. Denies alcohol or illicit drug use. Was following up with Dr. WohlAllen Norris this but has not seen him in a year.   Taking wellbutrin 150mg59mly states doesn't feel as good as she expected and it makes her dizzy.  PHQ2/9: Depression screen [facility] 07/07/2018 02/01/2018 12/14/2017 11/09/2017  Decreased Interest 0 0 0 0 0  Down, Depressed, Hopeless _0 PHQ - 2 Score _1 Altered sleeping _2 0 -  Tired,  decreased energy _3 -  Change in appetite 0 1 3 0 -  Feeling bad or failure about yourself  0 0 0 0 -  Trouble concentrating 0 0 0 0 -  Moving slowly or fidgety/restless 0 0 0 0 -  Suicidal thoughts 0 0 0 0 -  PHQ-9 Score _4 -  Difficult doing work/chores Not difficult at all Somewhat difficult Somewhat difficult Not difficult at all -  Some recent data might be hidden    PHQ reviewed. Positive.   Patient Active Problem List   Diagnosis Date Noted  . Abnormal weight loss   . Diarrhea   . Gastritis without bleeding   . Carotid stenosis 01/04/2018  . Pain in limb 01/04/2018  . Microalbuminuria 12/14/2017  . 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 Need for hepatitis A and B vaccination 10/12/2017  . Anxiety, generalized 09/03/2017  . Depression 09/03/2017  . Calcification of abdominal aorta (HCC) 09/03/2017  . Cirrhosis (HCC) [place]08/2019  . Hx of transient ischemic attack (TIA) 07/06/2017  . H/O carotid endarterectomy 05/28/2017  . Plantar fasciitis 11/27/2016  . Transaminitis 03/11/2016  . Environmental and seasonal allergies 12/05/2015  . Lipoma of abdominal wall 12/05/2015  . Dark urine 12/05/2015  . Abdominal mass, left lower quadrant 11/05/2015  . Heel pain 08/05/2015  . Osteoarthritis of knee  08/05/2015  . Chronic gout involving toe of right foot without tophus 05/28/2015  . Hypothyroidism 05/14/2015  . Pain and swelling of toe of left foot 04/04/2015  . Impingement syndrome of shoulder region 01/17/2015  . Lateral epicondylitis 01/17/2015  . Bilateral chronic knee pain 01/15/2015  . Vitamin B12 deficiency 12/25/2014  . Vitamin D deficiency 12/25/2014  . Uncontrolled type 2 diabetes mellitus with peripheral neuropathy ([place]) 12/25/2014  . Candidal intertrigo 12/25/2014  . Hx of transient ischemic attack (TIA) 12/25/2014  . Abnormal liver enzymes 12/25/2014    Past Medical History:  Diagnosis Date  .  Allergy   . Arthritis    hands, shoulders, knees, feet  . Calcification of abdominal aorta (HCC) 09/03/2017   CT scan [facility] Feb 2019  . Cirrhosis ([place]) 09/03/2017   Chest CT [facility] Feb 2019  . Depression   . Diabetes mellitus, type 2 ([place])   . Enlarged liver   . Family history of adverse reaction to anesthesia    Mother - PONV  . Fibromyalgia   . Fibromyalgia affecting multiple sites    pinched nerve in back of neck  . GERD (gastroesophageal reflux disease)   . Headache   . Hyperlipidemia   . Hypertension   . Hypothyroidism   . Motion sickness   . PONV (postoperative nausea and vomiting)   . Stroke ([place])   . Thyroid disease   . TIA (transient ischemic attack)   . Vertigo    after cataract surgery    Past Surgical History:  Procedure Laterality Date  . ABDOMINAL HYSTERECTOMY    . CARPAL TUNNEL RELEASE Left   . COLONOSCOPY  2015  . COLONOSCOPY WITH PROPOFOL N/A 11/05/2017   Procedure: COLONOSCOPY WITH PROPOFOL;  Surgeon: Lucilla Lame, MD;  Location: [place];  Service: Endoscopy;  Laterality: N/A;  Diabetic  . COLONOSCOPY WITH PROPOFOL N/A 02/25/2018   Procedure: COLONOSCOPY WITH PROPOFOL;  Surgeon: Lucilla Lame, MD;  Location: [place];  Service: Endoscopy;  Laterality: N/A;  . ESOPHAGOGASTRODUODENOSCOPY (EGD) WITH PROPOFOL N/A 11/05/2017   Procedure: ESOPHAGOGASTRODUODENOSCOPY (EGD) WITH PROPOFOL;  Surgeon: Lucilla Lame, MD;  Location: [place];  Service: Endoscopy;  Laterality: N/A;  . ESOPHAGOGASTRODUODENOSCOPY (EGD) WITH PROPOFOL N/A 02/25/2018   Procedure: ESOPHAGOGASTRODUODENOSCOPY (EGD) WITH PROPOFOL;  Surgeon: Lucilla Lame, MD;  Location: [place];  Service: Endoscopy;  Laterality: N/A;  diabetic - insulin and oral meds  . SPINE SURGERY    . VARICOSE VEIN SURGERY Left 1975    Social History   Tobacco Use  . Smoking status: Never Smoker  . Smokeless tobacco: Never Used  . Tobacco comment: smoking cessation materials not required   Substance Use Topics  . Alcohol use: No     Current Outpatient Medications:  .  Blood Glucose Monitoring Suppl (ONE TOUCH ULTRA MINI) w/Device KIT, 1 Device by Does not apply route 2 (two) times daily., Disp: 1 each, Rfl: 0 .  buPROPion (WELLBUTRIN XL) 150 MG 24 hr tablet, Take 150 mg by mouth daily., Disp: , Rfl:  .  cetirizine (ZYRTEC) 10 MG tablet, Take 10 mg by mouth daily as needed for allergies., Disp: , Rfl:  .  clopidogrel (PLAVIX) 75 MG tablet, Take 1 tablet (75 mg total) by mouth daily., Disp: 90 tablet, Rfl: 3 .  fluticasone (FLONASE) 50 MCG/ACT nasal spray, Place 2 sprays into both nostrils daily., Disp: 16 g, Rfl: 11 .  gabapentin (NEURONTIN) 300 MG capsule, Take 1 capsule (300 mg total) by mouth at  bedtime., Disp: 30 capsule, Rfl: 5 .  glucose blood (ONE TOUCH ULTRA TEST) test strip, TEST twice a day, Disp: 180 each, Rfl: 3 .  Insulin Glargine (LANTUS SOLOSTAR) 100 UNIT/ML Solostar Pen, Inject 40 Units into the skin at bedtime. (Patient taking differently: Inject 42 Units into the skin at bedtime. ), Disp: 5 pen, Rfl: 11 .  Insulin Pen Needle (BD PEN NEEDLE MICRO U/F) 32G X 6 MM MISC, USE AS DIRECTED once AT BEDTIME for insulin administration DX:E11.42 Lon:99 months, Disp: 100 each, Rfl: 3 .  ketoconazole (NIZORAL) 2 % cream, APPLY TOPICALLY TO THE AFFECTED AREA DAILY AS NEEDED FOR IRRITATION( WHERE NEEDED), Disp: 60 g, Rfl: 2 .  levothyroxine (SYNTHROID, LEVOTHROID) 50 MCG tablet, TAKE 1 TABLET(50 MCG) BY MOUTH DAILY BEFORE BREAKFAST, Disp: 90 tablet, Rfl: 0 .  losartan (COZAAR) 50 MG tablet, Take 1 tablet (50 mg total) by mouth daily., Disp: 90 tablet, Rfl: 3 .  metFORMIN (GLUCOPHAGE) 500 MG tablet, Take 1 tablet (500 mg total) by mouth 2 (two) times daily with a meal., Disp: 60 tablet, Rfl: 2 .  OneTouch Delica Lancets 73U MISC, 33 g by Does not apply route 2 (two) times daily. Test twice a day as directed, Disp: 100 each, Rfl: 2 .  pantoprazole (PROTONIX) 20 MG tablet, TAKE 1  TABLET BY MOUTH DAILY FOR HEARTBURN, REFLUX. PROLONGED USE MAY INCREASE RISK OF PNEUMONIA, COLITIS, OSTEOPOROSIS, ANEMIA, Disp: 90 tablet, Rfl: 0 .  pravastatin (PRAVACHOL) 20 MG tablet, Take 1 tablet (20 mg total) by mouth at bedtime. For cholesterol, Disp: 90 tablet, Rfl: 3 .  traZODone (DESYREL) 50 MG tablet, Take 50 mg by mouth at bedtime., Disp: , Rfl:   Allergies  Allergen Reactions  . Aspirin   . Duloxetine Hcl   . Penicillins   . Codeine Nausea And Vomiting and Rash  . Lyrica [Pregabalin] Nausea And Vomiting  . Sulfa Antibiotics Swelling    ROS   No other specific complaints in a complete review of systems (except as listed in HPI above).  Objective  Vitals:   11/01/18 1139  Temp: (!) 94.8 F (34.9 C)  TempSrc: Axillary     There is no height or weight on file to calculate BMI.  Patient is alert, able to speak in full sentences without difficulty.   Assessment & Plan  1. Calcification of abdominal aorta (HCC) Discussed diet  - pravastatin (PRAVACHOL) 20 MG tablet; Take 1 tablet (20 mg total) by mouth at bedtime. For cholesterol  Dispense: 90 tablet; Refill: 3  2. Other cirrhosis of liver (HCC) Continue follow-up with GI  - Comprehensive Metabolic Panel ([facility]ET)  3. Other specified hypothyroidism - levothyroxine (SYNTHROID) 50 MCG tablet; Take 1 tablet (50 mcg total) by mouth daily before breakfast.  Dispense: 90 tablet; Refill: 1  4. Well controlled type 2 diabetes mellitus with peripheral neuropathy (HCC) Last A1C controlled discussed quarantine diet - Comprehensive Metabolic Panel ([facility]ET)  5. Medication monitoring encounter CM[facility]  6. Moderate episode of recurrent major depressive disorder (HCC) Would like to stay on wellbutrin, does not want to try another medication at this time   7. Financial difficulties Discussed possible community resources for food assistance - Ambulatory referral to Chronic Care Management Services    I discussed the  assessment and treatment plan with the patient. The patient was provided an opportunity to ask questions and all were answered. The patient agreed with the plan and demonstrated an understanding of the instructions.   The patient was advised  to call back or seek an in-person evaluation if the symptoms worsen or if the condition fails to improve as anticipated.  I provided 25 minutes of non-face-to-face time during this encounter.   Fredderick Severance, NP

## 2018-11-02 ENCOUNTER — Telehealth: Payer: Self-pay | Admitting: Nurse Practitioner

## 2018-11-02 NOTE — Telephone Encounter (Signed)
Received Vitamin D, B12 level and A1C from [facility]. B12 is within normal range, continue treatmetn Vitamin D levels are low increase daily intake by 1000 IU daily. A1C is too high at 8.3% I want her to really work on her diet and add on another medication to help get this under better control- and add on a once a week injectable medicine to help get her sugars under better control and it can additionally help with weight loss. I do not see that she has any personal history of thyroid cancer or pancreatitis. Does she have any family history of specifically thyroid cancer? Also please schedule a one month follow-up to discuss diabetes.

## 2018-11-02 NOTE — Telephone Encounter (Addendum)
I called this patient to review her results but then we got disconnected so I called her back and had to leave a message.   CRM was created.

## 2018-11-02 NOTE — Telephone Encounter (Signed)
Received CMP, kidney function stable- drink plenty of water, avoid NSAIDs Electrolytes and Liver function test are within normal ranges

## 2018-11-02 NOTE — Telephone Encounter (Signed)
Spoke with patient and relayed the result information documented by Suezanne Cheshire, NP.  Patient verbalized understanding.  Patient wanted to know what injectable medicine she is going to be prescribed.  She is also wanted a diet plan to mailed out to her along with her lab results.  She has scheduled a one month follow up.

## 2018-11-03 ENCOUNTER — Ambulatory Visit: Payer: Self-pay | Admitting: *Deleted

## 2018-11-03 ENCOUNTER — Encounter: Payer: Self-pay | Admitting: Family Medicine

## 2018-11-03 ENCOUNTER — Encounter: Payer: Self-pay | Admitting: Nurse Practitioner

## 2018-11-03 ENCOUNTER — Telehealth: Payer: Self-pay | Admitting: *Deleted

## 2018-11-03 DIAGNOSIS — F411 Generalized anxiety disorder: Secondary | ICD-10-CM

## 2018-11-03 DIAGNOSIS — F329 Major depressive disorder, single episode, unspecified: Secondary | ICD-10-CM

## 2018-11-03 NOTE — Telephone Encounter (Signed)
Please review previous message and advise 

## 2018-11-03 NOTE — Patient Instructions (Signed)
Diabetes Mellitus and Nutrition, Adult When you have diabetes (diabetes mellitus), it is very important to have healthy eating habits because your blood sugar (glucose) levels are greatly affected by what you eat and drink. Eating healthy foods in the appropriate amounts, at about the same times every day, can help you:  Control your blood glucose.  Lower your risk of heart disease.  Improve your blood pressure.  Reach or maintain a healthy weight. Every person with diabetes is different, and each person has different needs for a meal plan. Your health care provider may recommend that you work with a diet and nutrition specialist (dietitian) to make a meal plan that is best for you. Your meal plan may vary depending on factors such as:  The calories you need.  The medicines you take.  Your weight.  Your blood glucose, blood pressure, and cholesterol levels.  Your activity level.  Other health conditions you have, such as heart or kidney disease. How do carbohydrates affect me? Carbohydrates, also called carbs, affect your blood glucose level more than any other type of food. Eating carbs naturally raises the amount of glucose in your blood. Carb counting is a method for keeping track of how many carbs you eat. Counting carbs is important to keep your blood glucose at a healthy level, especially if you use insulin or take certain oral diabetes medicines. It is important to know how many carbs you can safely have in each meal. This is different for every person. Your dietitian can help you calculate how many carbs you should have at each meal and for each snack. Foods that contain carbs include:  Bread, cereal, rice, pasta, and crackers.  Potatoes and corn.  Peas, beans, and lentils.  Milk and yogurt.  Fruit and juice.  Desserts, such as cakes, cookies, ice cream, and candy. How does alcohol affect me? Alcohol can cause a sudden decrease in blood glucose (hypoglycemia),  especially if you use insulin or take certain oral diabetes medicines. Hypoglycemia can be a life-threatening condition. Symptoms of hypoglycemia (sleepiness, dizziness, and confusion) are similar to symptoms of having too much alcohol. If your health care provider says that alcohol is safe for you, follow these guidelines:  Limit alcohol intake to no more than 1 drink per day for nonpregnant women and 2 drinks per day for men. One drink equals 12 oz of beer, 5 oz of wine, or 1 oz of hard liquor.  Do not drink on an empty stomach.  Keep yourself hydrated with water, diet soda, or unsweetened iced tea.  Keep in mind that regular soda, juice, and other mixers may contain a lot of sugar and must be counted as carbs. What are tips for following this plan? Reading food labels  Start by checking the serving size on the "Nutrition Facts" label of packaged foods and drinks. The amount of calories, carbs, fats, and other nutrients listed on the label is based on one serving of the item. Many items contain more than one serving per package.  Check the total grams (g) of carbs in one serving. You can calculate the number of servings of carbs in one serving by dividing the total carbs by 15. For example, if a food has 30 g of total carbs, it would be equal to 2 servings of carbs.  Check the number of grams (g) of saturated and trans fats in one serving. Choose foods that have low or no amount of these fats.  Check the number of milligrams (  mg) of salt (sodium) in one serving. Most people should limit total sodium intake to less than 2,300 mg per day.  Always check the nutrition information of foods labeled as "low-fat" or "nonfat". These foods may be higher in added sugar or refined carbs and should be avoided.  Talk to your dietitian to identify your daily goals for nutrients listed on the label. Shopping  Avoid buying canned, premade, or processed foods. These foods tend to be high in fat, sodium,  and added sugar.  Shop around the outside edge of the grocery store. This includes fresh fruits and vegetables, bulk grains, fresh meats, and fresh dairy. Cooking  Use low-heat cooking methods, such as baking, instead of high-heat cooking methods like deep frying.  Cook using healthy oils, such as olive, canola, or sunflower oil.  Avoid cooking with butter, cream, or high-fat meats. Meal planning  Eat meals and snacks regularly, preferably at the same times every day. Avoid going Leon periods of time without eating.  Eat foods high in fiber, such as fresh fruits, vegetables, beans, and whole grains. Talk to your dietitian about how many servings of carbs you can eat at each meal.  Eat 4-6 ounces (oz) of lean protein each day, such as lean meat, chicken, fish, eggs, or tofu. One oz of lean protein is equal to: ? 1 oz of meat, chicken, or fish. ? 1 egg. ?  cup of tofu.  Eat some foods each day that contain healthy fats, such as avocado, nuts, seeds, and fish. Lifestyle  Check your blood glucose regularly.  Exercise regularly as told by your health care provider. This may include: ? 150 minutes of moderate-intensity or vigorous-intensity exercise each week. This could be brisk walking, biking, or water aerobics. ? Stretching and doing strength exercises, such as yoga or weightlifting, at least 2 times a week.  Take medicines as told by your health care provider.  Do not use any products that contain nicotine or tobacco, such as cigarettes and e-cigarettes. If you need help quitting, ask your health care provider.  Work with a Social worker or diabetes educator to identify strategies to manage stress and any emotional and social challenges. Questions to ask a health care provider  Do I need to meet with a diabetes educator?  Do I need to meet with a dietitian?  What number can I call if I have questions?  When are the best times to check my blood glucose? Where to find more  information:  American Diabetes Association: diabetes.org  Academy of Nutrition and Dietetics: www.eatright.[employer] of Diabetes and Digestive and Kidney Diseases (NIH): [employer] Summary  A healthy meal plan will help you control your blood glucose and maintain a healthy lifestyle.  Working with a diet and nutrition specialist (dietitian) can help you make a meal plan that is best for you.  Keep in mind that carbohydrates (carbs) and alcohol have immediate effects on your blood glucose levels. It is important to count carbs and to use alcohol carefully. This information is not intended to replace advice given to you by your health care provider. Make sure you discuss any questions you have with your health care provider. Document Released: 03/12/2005 Document Revised: 01/13/2017 Document Reviewed: 07/20/2016 Elsevier Interactive Patient Education  2019 [place] [facility] Even small changes to your cooking can help you reduce your risk for heart disease. You can protect your heart and blood vessels by: Making food choices that include healthy fats and cutting  back on those with less healthy fats.  Getting to and maintaining a healthy weight; it's hard work, but well worth it.  Especially if you have high blood pressure, cutting down on foods that are high in sodium can make a difference. Choose the right fats-in moderation Foods like packaged (store bought) snacks, sweets, baked goods, fried foods, red meat and processed meats like bacon and sausage are high in saturated fat that raises your bad cholesterol. Fresh vegetables, whole grains, and fruit are low in fat and high in vitamins, minerals and dietary fiber that can reduce your risk of heart disease. Nuts, avocados, and plant-based oils (like olive, peanut and safflower oils to name a few) provide you with healthy fats. When cooking, pay attention to the amount of oils and butter you add to lower the  total calories to help with weight management. Butter is high in saturated fat, so try to cut back on the amount you use. Include those omega-3s Foods high in omega-3 fats are especially beneficial for your heart health and include "fatty" fish like salmon, albacore tuna, herring, rainbow trout, mackerel, and sardines. Other foods that provide omega-3 fatty acids include soybean products, walnuts, flaxseed and canola oil. Try to include these in your eating plan on a regular basis, but do pay attention to your portions because a small amount goes a Over[place] way. Choose a healthy cooking method You can cut down on the calories and unhealthy fats in your meals by broiling, baking, roasting, steaming, or grilling foods. When you fry foods, it increases the unhealthy fat and overall calorie content. It is okay to use some fat when cooking, but don't overdo it. Homemade and fresh is best Preparing foods at home gives you more control over what you are eating. Restaurant foods are almost always larger portions with more fat, sugar, and salt added to them. Use the Diabetes Food Hub to get some ideas for healthy foods you can cook at home. It doesn't have to be complicated, and it can save time and cost less, too. More flavor with less fat, sugar and salt Try using herbs and spices for flavor instead of salt, butter, lard, or other unhealthy fats. Here are a few ideas to add flavor to your food: Squeeze fresh lemon juice or lime juice on steamed vegetables, broiled fish, rice, salads or pasta.  Try a salt-free herbs and spices. Fresh herbs are also a great choice.  Onion and garlic add lots of flavor without the bad stuff.  Try marinades for meat with healthy plant based oils, herbs and spices. Trim the fat Cut away visible fat from meat and poultry. Roast food on a rack to let the fat drip off. Choose cuts of meat that are lean and peel the skin off poultry before you eat it. Substitute Healthier Ingredients  In Your Favorite Recipes Instead of regular ground beef... Try 90% lean ground beef or better yet, try lean ground [place] breast. Why??Fewer calories, less saturated fat and less cholesterol. Instead of sour cream on tacos or in dips... Try plain yogurt (regular or [place]) Why? Fewer calories and less saturated fat Instead of?butter or margarine when cooking  Try oils like olive, safflower, and other plant-based oils or reduce how much butter you use Why? less of the bad fats and more heart-healthy fats Instead of snack foods like crackers, chips, candy or backed goods... Try fruit with plain yogurt, fresh vegetables and hummus, a slice of whole wheat toast, and natural peanut  butter, or nuts Why? Less sodium, less saturated fat and zero trans fat Instead of regular mayonnaise... Try mustard on sandwiches, or try yogurt or a combination of yogurt and less mayonnaise if used in dressing, sauces, and dips Why? Fewer calories, more nutrients Instead of bologna, salami or pastrami... Try sliced low-sodium [place] or roast beef. Or better yet, cook fresh chicken or [place] on the weekend and use throughout the week for meals Why??Less total fat, less saturated fat and less sodium _____ Get to Know Carbs Carbohydrates or "carbs" get a lot of attention these days and you may wonder if you should even eat them at all. The fact is that food is made up of three main things: carbohydrate, protein, and fat. You need all of these to stay healthy, but the amounts that each person needs or chooses to eat may be very different. The most important thing is choosing the carbs that give you the most bang for your buck in terms of vitamins, minerals, and fiber. Processed foods tend to be high in carbohydrate while very low in vitamins, minerals, and fiber, giving carbs a bad rap. But choosing less processed carb foods and paying attention to how much you are eating can make a big difference in your blood sugar and  overall health. Carbs come in many different forms, but let's focus on the top three: starch, sugar and fiber. Starch Foods high in starch include: Starchy vegetables like peas, corn, lima beans and potatoes  Dried beans, lentils and peas such as pinto beans, kidney beans, black eyed peas, and split peas  Grains like oats, barley, rice, wheat, and others.  Whole grains are just that, the whole plant that has been harvested and dried with little processing. They provide fiber as well as essential vitamins including B and E and other minerals needed for optimal health. Refined grains are processed to remove the most healthful parts including fiber, vitamins, and minerals. Laws were passed in the [place] to ensure that essential vitamins and minerals be added back in during processing as a result of vitamin and mineral deficiencies leading to diseases in children and adults. Sugar Sugar is another source of carbohydrate. There are two main types of sugars: Naturally occurring sugars such as those in milk or fruit  Added sugars that are added during processing, such as fruit canned in heavy syrup, sugar added to make a cookie, and table sugar to name a few. There are many different names for sugar. Examples of common names are table sugar, brown sugar, molasses, honey, beet sugar, cane sugar, confectioner's sugar, powdered sugar, raw sugar, turbinado, maple syrup, high-fructose corn syrup, agave nectar and sugar cane. If you are looking for information about sugar substitutes, look here. Fiber Fiber comes from plant-based foods and is important for our gut health. Fiber also makes [place] feel full and helps lower cholesterol. Aim for 25-30 grams per day. Fiber is found in plant foods, including fruits, vegetables, whole grains, nuts, and pulses (dried beans, peas and lentils). Fiber is like your body's natural scrub brush, passing through your digestive tract carrying a lot of bad stuff out with it. For  optimal health, adults need to eat 25 to 30 grams of fiber each day. Most Americans do not consume nearly enough fiber in their diet, so while it is wise to aim for this goal, any increase in fiber in your diet can be helpful. Most of [place] only get about half of what is recommended. Eating foods  higher in fiber can improve your digestion, lower your blood sugar, and reduce your risk of heart disease. Good sources of dietary fiber include: Beans and legumes. Think black beans, kidney beans, pintos, chick peas (garbanzos), white beans, and lentils  Fruits and vegetables (for example, apples, celery and beans) and those with edible seeds (for example, berries) Whole grains such as: Whole wheat pasta  Whole grain cereals like old fashioned or steel cut oats  Whole grain breads (To be a good source of fiber, one slice of bread should have at least three grams of fiber. Another good indication: look for breads where the first ingredient is a whole grain. For example, whole wheat or oats.) Many grain products now have "double fiber" with extra fiber added.  Nuts - try different kinds. Peanuts, walnuts, and almonds are a good source of fiber and healthy fat, but watch portion sizes, because they also contain a lot of calories in a small amount. You can find foods that are naturally high in fiber that are labeled as "excellent source," meaning they contain more than 5 grams of fiber; while foods labeled as "good source" contain at least 2.5 grams of fiber. It is best to get your fiber from food rather than taking a supplement, but if that is not possible, a supplement can help. If you haven't been eating a lot of foods high in fiber on a daily basis, it's important that you increase your intake slowly. Even though they are good for you, it can take time for your body to adjust. A sudden increase in eating foods high in fiber (especially foods with added fiber or when using supplements) can cause gas, bloating, or  constipation. Be sure you are drinking enough water too, because fiber needs water to move through your body!

## 2018-11-04 NOTE — Patient Instructions (Signed)
Thank you allowing the Chronic Care Management Team to be a part of your care! It was a pleasure speaking with you today!  1. Please continue to practice self care 2. Please continue to consider grief counseling 3. Please contact this CCM Education officer, museum with any questions or concerns,       CCM (Chronic Care Management) Team   Trish Fountain RN, BSN Nurse Care Coordinator  [phone]  [name] PharmD  Clinical Pharmacist  [phone]   [place], LCSW Clinical Social Worker [phone]  Goals Addressed            This Visit's Progress   . I am very depressed over the death of my daughter (pt-stated)       Current Barriers:  . Financial constraints . Mental Health Concerns  . Lacks knowledge of community resource: food banks and affordable housing  Clinical Social Work Clinical Goal(s):  [name] Kitchen Over the next 30 days, client will work with SW to address concerns related to depressed mood and community resources  Interventions: . Patient interviewed and appropriate assessments performed . Provided mental health counseling with regard to grief issues related to the loss of her daughter (mental health diagnosis or concern) . Provided patient with information about the stages of grief, resources related to grief counseling and additional community resources for assistance with food and affordable housing. . Discussed plans with patient for ongoing care management follow up and provided patient with direct contact information for care management team   Patient Self Care Activities:  . Self administers medications as prescribed . Attends all scheduled provider appointments . Performs ADL's independently . Performs IADL's independently . Calls provider office for new concerns or questions  Initial goal documentation         The patient verbalized understanding of instructions provided today and declined a print copy of patient instruction materials.    The CM team will reach out to the patient again over the next 7 days.

## 2018-11-04 NOTE — Telephone Encounter (Signed)
I have added some information in her AVS which can be printed for her. I have reached out to [facility] to get additional information about medication to be added with best coverage and then we will let her know.

## 2018-11-04 NOTE — Chronic Care Management (AMB) (Signed)
     Clinical Social Work Follow Up Note  11/04/2018 Name: Charlotte Herrera MRN: [id number] DOB: 11-Mar-1946  Charlotte Herrera is a 73 y.o. year old female who is a primary care patient of Lada, Satira Anis, MD. The CCM team was consulted for assistance with Mental Health Counseling and Resources.   Review of patient status, including review of consultants reports, other relevant assessments, and collaboration with appropriate care team members and the patient's provider was performed as part of comprehensive patient evaluation and provision of chronic care management services.     Goals Addressed            This Visit's Progress   . I am very depressed over the death of my daughter (pt-stated)       Contact made to patient today by phone. 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 Patient discussed need resources for food banks and affordable housing. Patient further discussed the loss of her daughter in 2018 and the experience of significant grief and the difficulty in moving through the grieving process. Emotional support provided, emphasizing self care.  Current Barriers:  . Financial constraints . Mental Health Concerns  . Lacks knowledge of community resource: food banks and affordable housing  Clinical Social Work Clinical Goal(s):  [name] Kitchen Over the next 30 days, client will work with SW to address concerns related to depressed mood and community resources  Interventions: . Patient interviewed and appropriate assessments performed . Provided mental health counseling with regard to grief issues related to the loss of her daughter (mental health diagnosis or concern) . Provided patient with information about the stages of grief, resources related to grief counseling and additional community resources for assistance with food and affordable housing. . Discussed plans with patient for ongoing care management follow up  and provided patient with direct contact information for care management team   Patient Self Care Activities:  . Self administers medications as prescribed . Attends all scheduled provider appointments . Performs ADL's independently . Performs IADL's independently . Calls provider office for new concerns or questions  Initial goal documentation         Follow Up Plan: SW will follow up with patient by phone over the next week   [place], [place] Worker  [facility] Care Management [phone]

## 2018-11-07 ENCOUNTER — Other Ambulatory Visit: Payer: Self-pay

## 2018-11-07 ENCOUNTER — Ambulatory Visit: Payer: Self-pay

## 2018-11-07 DIAGNOSIS — I129 Hypertensive chronic kidney disease with stage 1 through stage 4 chronic kidney disease, or unspecified chronic kidney disease: Secondary | ICD-10-CM

## 2018-11-07 DIAGNOSIS — E1122 Type 2 diabetes mellitus with diabetic chronic kidney disease: Secondary | ICD-10-CM

## 2018-11-07 DIAGNOSIS — E1142 Type 2 diabetes mellitus with diabetic polyneuropathy: Secondary | ICD-10-CM

## 2018-11-07 DIAGNOSIS — IMO0002 Reserved for concepts with insufficient information to code with codable children: Secondary | ICD-10-CM

## 2018-11-07 NOTE — Patient Instructions (Addendum)
Thank you allowing the Chronic Care Management Team to be a part of your care! It was a pleasure speaking with you today!  1. Please continue to take all medications as prescribed. 2. I will discuss with Suezanne Cheshire your concerns for addition another costly diabetes medication. 3. Please continue to check your sugars twice daily as you are doing and record. 4. I am glad you admit to not adhering to a diabetic diet as evidenced by your increase in A1C 5. Please discuss your financial barriers with LCSW to see if you may have community resources available. 6. Read ALL the information I am providing to you today. It is very important for you to change your diet if you wish to not add another medication. 7. You also must remain hydrated with water and exercise as tolerated.   CCM (Chronic Care Management) Team   Trish Fountain RN, BSN Nurse Care Coordinator  [phone]  [name] PharmD  Clinical Pharmacist  [phone]   Elliot Gurney, LCSW Clinical Social Worker [phone]  Goals Addressed            This Visit's Progress   . COMPLETED: I know I am very depressed (pt-stated)       Current Barriers:  [name] Kitchen Knowledge deficit related to understanding symptoms of depression . Knowledge deficit related to understanding how and when to take depression medication . Worry and anxiety related to COVID-19 pandemic and lack of additional income . Financial insecurities  Nurse Case Manager Clinical Goal(s):   Over the next 30 days, patient will take Welbutrin as prescribed IN THE AM  Over the next 30 days, patient will continue to utilize sleep hygiene techniques discussed today  Over the next 30 days, patient will spend time daily outdoors as weather permits   Over the next 14 days, patient will engage with [place] to address social needs and depression symptoms  Interventions:  . Active listening, provided emotional support and reassurance . Discussed  current symptoms of depression patient is experiencing . Discussed importance of daily exercise as it relates to improving depression symptoms . Offered resource for grief counseling including introduction to CCM Social Work services- Patient consented to [facility] 10/25/18 . Reinforced sleep hygiene  Reinforced that it may take up to 4 weeks for patient to see improvement in her mood  Collaboration with [place] and referral placed  Patient Self Care Activities:  . Take welbutrin in the morning . Get plenty of sunshine . Remain engaged with family and friends . Try using lavendar essential oils for its calming effects . Incorporate exercise into daily schedule . Engages with [place]   Please see past updates in goals section for documentation of goal progression     . I would like to manage my diabetes without adding another medicine (pt-stated)       Current Barriers:  [name] Kitchen Knowledge Deficits related to basic Diabetes pathophysiology and self care/management . Financial strain  Armed forces operational officer Clinical Goal(s):  [name] Kitchen Over the next 14 days, patient will demonstrate improved adherence to prescribed treatment plan for diabetes self care/management as evidenced by checking blood glucose levels as prescribed, adhering to ADA/low carb diet, daily exercise, and medication adherence  Interventions:         Reviewed most recent MD note and labs regarding patient's increased A1C . Provided education to patient re: diabetes including A1C results and specifically ADA diet and nutrition . Reviewed medications with patient  and discussed importance of medication adherence . Discussed plans with patient for ongoing care management follow up and provided patient with direct contact information for care management team . Provided patient with written educational materials related to hypo and hyperglycemia and importance of correct treatment . Reviewed scheduled/upcoming provider  appointments including:  . Advised patient, providing education and rationale, to check cbg twice daily and record   Patient Self Care Activities:  . Self administers medications as prescribed . Attends all scheduled provider appointments . Checks blood sugars as prescribed and utilize hyper and hypoglycemia protocol as needed . Adhere to ADA diet as discussed  Plan:  . RNCM will provide written educational materials via mail   Initial goal documentation        Print copy of patient instructions provided.  via mail Telephone follow up appointment with CCM team member scheduled for: next week    Type 2 Diabetes Mellitus, Self Care, Adult When you have type 2 diabetes (type 2 diabetes mellitus), you must make sure your blood sugar (glucose) stays in a healthy range. You can do this with:  Nutrition.  Exercise.  Lifestyle changes.  Medicines or insulin, if needed.  Support from your doctors and others. How to stay aware of blood sugar   Check your blood sugar level every day, as often as told.  Have your A1c (hemoglobin A1c) level checked two or more times a year. Have it checked more often if your doctor tells you to. Your doctor will set personal treatment goals for you. Generally, you should have these blood sugar levels:  Before meals (preprandial): 80-130 mg/dL (4.4-7.2 mmol/L).  After meals (postprandial): below 180 mg/dL (10 mmol/L).  A1c level: less than 7%. How to manage high and low blood sugar Signs of high blood sugar High blood sugar is called hyperglycemia. Know the signs of high blood sugar. Signs may include:  Feeling: ? Thirsty. ? Hungry. ? Very tired.  Needing to pee (urinate) more than usual.  Blurry vision. Signs of low blood sugar Low blood sugar is called hypoglycemia. This is when blood sugar is at or below 70 mg/dL (3.9 mmol/L). Signs may include:  Feeling: ? Hungry. ? Worried or nervous (anxious). ? Sweaty and  clammy. ? Confused. ? Dizzy. ? Sleepy. ? Sick to your stomach (nauseous).  Having: ? A fast heartbeat. ? A headache. ? A change in your vision. ? Jerky movements that you cannot control (seizure). ? Tingling or no feeling (numbness) around your mouth, lips, or tongue.  Having trouble with: ? Moving (coordination). ? Sleeping. ? Passing out (fainting). ? Getting upset easily (irritability). Treating low blood sugar To treat low blood sugar, eat or drink something sugary right away. If you can think clearly and swallow safely, follow the 15:15 rule:  Take 15 grams of a fast-acting carb (carbohydrate). Talk with your doctor about how much you should take.  Some fast-acting carbs are: ? Sugar tablets (glucose pills). Take 3-4 pills. ? 6-8 pieces of hard candy. ? 4-6 oz (120-150 mL) of fruit juice. ? 4-6 oz (120-150 mL) of regular (not diet) soda. ? 1 Tbsp (15 mL) honey or sugar.  Check your blood sugar 15 minutes after you take the carb.  If your blood sugar is still at or below 70 mg/dL (3.9 mmol/L), take 15 grams of a carb again.  If your blood sugar does not go above 70 mg/dL (3.9 mmol/L) after 3 tries, get help right away.  After your blood sugar  goes back to normal, eat a meal or a snack within 1 hour. Treating very low blood sugar If your blood sugar is at or below 54 mg/dL (3 mmol/L), you have very low blood sugar (severe hypoglycemia). This is an emergency. Do not wait to see if the symptoms will go away. Get medical help right away. Call your local emergency services (911 in the [place] If you have very low blood sugar and you cannot eat or drink, you may need a glucagon shot (injection). A family member or friend should learn how to check your blood sugar and how to give you a glucagon shot. Ask your doctor if you need to have a glucagon shot kit at home. Follow these instructions at home: Medicine  Take insulin and diabetes medicines as told.  If your doctor says  you should take more or less insulin and medicines, do this exactly as told.  Do not run out of insulin or medicines. Having diabetes can raise your risk for other [place]-term conditions. These include heart disease and kidney disease. Your doctor may prescribe medicines to help you not have these problems. Food   Make healthy food choices. These include: ? Chicken, fish, egg whites, and beans. ? Oats, whole wheat, bulgur, brown rice, quinoa, and millet. ? Fresh fruits and vegetables. ? Low-fat dairy products. ? Nuts, avocado, olive oil, and canola oil.  Meet with a food specialist (dietitian). He or she can help you make an eating plan that is right for you.  Follow instructions from your doctor about what you cannot eat or drink.  Drink enough fluid to keep your pee (urine) pale yellow.  Keep track of carbs that you eat. Do this by reading food labels and learning food serving sizes.  Follow your sick day plan when you cannot eat or drink normally. Make this plan with your doctor so it is ready to use. Activity  Exercise 3 or more times a week.  Do not go more than 2 days without exercising.  Talk with your doctor before you start a new exercise. Your doctor may need to tell you to change: ? How much insulin or medicines you take. ? How much food you eat. Lifestyle  Do not use any tobacco products. These include cigarettes, chewing tobacco, and e-cigarettes. If you need help quitting, ask your doctor.  Ask your doctor how much alcohol is safe for you.  Learn to deal with stress. If you need help with this, ask your doctor. Body care   Stay up to date with your shots (immunizations).  Have your eyes and feet checked by a doctor as often as told.  Check your skin and feet every day. Check for cuts, bruises, redness, blisters, or sores.  Brush your teeth and gums two times a day. Floss one or more times a day.  Go to the dentist one or more times every 6 months.  Stay  at a healthy weight. General instructions  Take over-the-counter and prescription medicines only as told by your doctor.  Share your diabetes care plan with: ? Your work or school. ? People you live with.  Carry a card or wear jewelry that says you have diabetes.  Keep all follow-up visits as told by your doctor. This is important. Questions to ask your doctor  Do I need to meet with a diabetes educator?  Where can I find a support group for people with diabetes? Where to find more information To learn more about diabetes,  visit:  American Diabetes Association: www.diabetes.org  American Association of Diabetes Educators: www.diabeteseducator.org Summary  When you have type 2 diabetes, you must make sure your blood sugar (glucose) stays in a healthy range.  Check your blood sugar every day, as often as told.  Having diabetes can raise your risk for other conditions. Your doctor may prescribe medicines to help you not have these problems.  Keep all follow-up visits as told by your doctor. This is important. This information is not intended to replace advice given to you by your health care provider. Make sure you discuss any questions you have with your health care provider. Document Released: 10/07/2015 Document Revised: 12/06/2017 Document Reviewed: 07/19/2015 Elsevier Interactive Patient Education  2019 [employer].   Diabetes Mellitus and Nutrition, Adult When you have diabetes (diabetes mellitus), it is very important to have healthy eating habits because your blood sugar (glucose) levels are greatly affected by what you eat and drink. Eating healthy foods in the appropriate amounts, at about the same times every day, can help you:  Control your blood glucose.  Lower your risk of heart disease.  Improve your blood pressure.  Reach or maintain a healthy weight. Every person with diabetes is different, and each person has different needs for a meal plan. Your health  care provider may recommend that you work with a diet and nutrition specialist (dietitian) to make a meal plan that is best for you. Your meal plan may vary depending on factors such as:  The calories you need.  The medicines you take.  Your weight.  Your blood glucose, blood pressure, and cholesterol levels.  Your activity level.  Other health conditions you have, such as heart or kidney disease. How do carbohydrates affect me? Carbohydrates, also called carbs, affect your blood glucose level more than any other type of food. Eating carbs naturally raises the amount of glucose in your blood. Carb counting is a method for keeping track of how many carbs you eat. Counting carbs is important to keep your blood glucose at a healthy level, especially if you use insulin or take certain oral diabetes medicines. It is important to know how many carbs you can safely have in each meal. This is different for every person. Your dietitian can help you calculate how many carbs you should have at each meal and for each snack. Foods that contain carbs include:  Bread, cereal, rice, pasta, and crackers.  Potatoes and corn.  Peas, beans, and lentils.  Milk and yogurt.  Fruit and juice.  Desserts, such as cakes, cookies, ice cream, and candy. How does alcohol affect me? Alcohol can cause a sudden decrease in blood glucose (hypoglycemia), especially if you use insulin or take certain oral diabetes medicines. Hypoglycemia can be a life-threatening condition. Symptoms of hypoglycemia (sleepiness, dizziness, and confusion) are similar to symptoms of having too much alcohol. If your health care provider says that alcohol is safe for you, follow these guidelines:  Limit alcohol intake to no more than 1 drink per day for nonpregnant women and 2 drinks per day for men. One drink equals 12 oz of beer, 5 oz of wine, or 1 oz of hard liquor.  Do not drink on an empty stomach.  Keep yourself hydrated with  water, diet soda, or unsweetened iced tea.  Keep in mind that regular soda, juice, and other mixers may contain a lot of sugar and must be counted as carbs. What are tips for following this plan?  Reading food  labels  Start by checking the serving size on the "Nutrition Facts" label of packaged foods and drinks. The amount of calories, carbs, fats, and other nutrients listed on the label is based on one serving of the item. Many items contain more than one serving per package.  Check the total grams (g) of carbs in one serving. You can calculate the number of servings of carbs in one serving by dividing the total carbs by 15. For example, if a food has 30 g of total carbs, it would be equal to 2 servings of carbs.  Check the number of grams (g) of saturated and trans fats in one serving. Choose foods that have low or no amount of these fats.  Check the number of milligrams (mg) of salt (sodium) in one serving. Most people should limit total sodium intake to less than 2,300 mg per day.  Always check the nutrition information of foods labeled as "low-fat" or "nonfat". These foods may be higher in added sugar or refined carbs and should be avoided.  Talk to your dietitian to identify your daily goals for nutrients listed on the label. Shopping  Avoid buying canned, premade, or processed foods. These foods tend to be high in fat, sodium, and added sugar.  Shop around the outside edge of the grocery store. This includes fresh fruits and vegetables, bulk grains, fresh meats, and fresh dairy. Cooking  Use low-heat cooking methods, such as baking, instead of high-heat cooking methods like deep frying.  Cook using healthy oils, such as olive, canola, or sunflower oil.  Avoid cooking with butter, cream, or high-fat meats. Meal planning  Eat meals and snacks regularly, preferably at the same times every day. Avoid going [place] periods of time without eating.  Eat foods high in fiber, such as  fresh fruits, vegetables, beans, and whole grains. Talk to your dietitian about how many servings of carbs you can eat at each meal.  Eat 4-6 ounces (oz) of lean protein each day, such as lean meat, chicken, fish, eggs, or tofu. One oz of lean protein is equal to: ? 1 oz of meat, chicken, or fish. ? 1 egg. ?  cup of tofu.  Eat some foods each day that contain healthy fats, such as avocado, nuts, seeds, and fish. Lifestyle  Check your blood glucose regularly.  Exercise regularly as told by your health care provider. This may include: ? 150 minutes of moderate-intensity or vigorous-intensity exercise each week. This could be brisk walking, biking, or water aerobics. ? Stretching and doing strength exercises, such as yoga or weightlifting, at least 2 times a week.  Take medicines as told by your health care provider.  Do not use any products that contain nicotine or tobacco, such as cigarettes and e-cigarettes. If you need help quitting, ask your health care provider.  Work with a Social worker or diabetes educator to identify strategies to manage stress and any emotional and social challenges. Questions to ask a health care provider  Do I need to meet with a diabetes educator?  Do I need to meet with a dietitian?  What number can I call if I have questions?  When are the best times to check my blood glucose? Where to find more information:  American Diabetes Association: diabetes.org  Academy of Nutrition and Dietetics: www.eatright.[employer] of Diabetes and Digestive and Kidney Diseases (NIH): [employer] Summary  A healthy meal plan will help you control your blood glucose and maintain a healthy lifestyle.  Working  with a diet and nutrition specialist (dietitian) can help you make a meal plan that is best for you.  Keep in mind that carbohydrates (carbs) and alcohol have immediate effects on your blood glucose levels. It is important to count carbs and to  use alcohol carefully. This information is not intended to replace advice given to you by your health care provider. Make sure you discuss any questions you have with your health care provider. Document Released: 03/12/2005 Document Revised: 01/13/2017 Document Reviewed: 07/20/2016 Elsevier Interactive Patient Education  2019 [employer].

## 2018-11-07 NOTE — Chronic Care Management (AMB) (Signed)
Chronic Care Management   Follow Up Note   11/07/2018 Name: Charlotte Herrera MRN: [id number] DOB: 11-Oct-1945  Referred by: Charlotte Courser, MD Reason for referral : Chronic Care Management (follow up phone call elevated A1C)   Subjective: "I really don't want to add another medication for diabetes when I think I can do it with getting back on a diabetic diet" "I may not be able to afford an expensive medicine"   Objective:  Lab Results  Component Value Date   HGBA1C 8.3 10/31/2018   HGBA1C 6.3 (H) 04/26/2018   HGBA1C 6.4 (H) 11/12/2017   Lab Results  Component Value Date   MICROALBUR NEG 04/29/2016   LDLCALC 70 04/26/2018   CREATININE 1.0 11/01/2018    Assessment: Charlotte Herrera a 73year old femalewho seesDr.Melinda Ladafor primary care. Dr. Dirk Herrera the CCM team to consult the patient formedication affordability and chronic disease management. 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 Referral was placed2/10/2018. Patient's last office visit was2/10/2018. CCM Team met with Charlotte Herrera face to face to establish health goals2/13/2020and has been following Charlotte Herrera since. Today CCM RN CM followed up with Charlotte Herrera secondary to a new referral for DM education as patients A1C has increased from 6.3 to 8.3.  Review of patient status, including review of consultants reports, relevant laboratory and other test results, and collaboration with appropriate care team members and the patient's provider was performed as part of comprehensive patient evaluation and provision of chronic care management services.    Goals Addressed            This Visit's Progress   . COMPLETED: I know I am very depressed (pt-stated)       Charlotte Herrera is currently engaged with [place] for counseling. She continues to take her Welbutrin and today, CCM RN CM commented on how Charlotte Herrera sounded and communicated over the phone. Charlotte Herrera  admits her mood is improved but she relates that to not being under stress from work. She was furloughed secondary to covid-19. She continues to struggle financially but her daughter is assisting with groceries and utilities when she can.   Current Barriers:  [name] Kitchen Knowledge deficit related to understanding symptoms of depression . Knowledge deficit related to understanding how and when to take depression medication . Worry and anxiety related to COVID-19 pandemic and lack of additional income . Financial insecurities  Nurse Case Manager Clinical Goal(s):   Over the next 30 days, patient will take Welbutrin as prescribed IN THE AM  Over the next 30 days, patient will continue to utilize sleep hygiene techniques discussed today  Over the next 30 days, patient will spend time daily outdoors as weather permits   Over the next 14 days, patient will engage with [place] to address social needs and depression symptoms  Interventions:  . Active listening, provided emotional support and reassurance . Discussed current symptoms of depression patient is experiencing . Discussed importance of daily exercise as it relates to improving depression symptoms . Offered resource for grief counseling including introduction to CCM Social Work services- Patient consented to [facility] 10/25/18 . Reinforced sleep hygiene  Reinforced that it may take up to 4 weeks for patient to see improvement in her mood  Collaboration with [place] and referral placed  Patient Self Care Activities:  . Take welbutrin in the morning . Get plenty of sunshine . Remain engaged with  family and friends . Try using lavendar essential oils for its calming effects . Incorporate exercise into daily schedule . Engages with [place]   Please see past updates in goals section for documentation of goal progression     . I would like to manage my diabetes without adding another medicine (pt-stated)       Charlotte Herrera  states she is not surprised her A1C is elevated. She has not been watching her diet and eating whatever is available. This is somewhat related to finances but also her love of sweets. She has controlled her sugars in the past with diet and medications and wishes to be given the chance to improve her diet and exercise before adding another medication that she may not be able to afford. She states she is committed to changes and request additional written education materials to be mailed to her home for review.   Current Barriers:  [name] Kitchen Knowledge Deficits related to basic Diabetes pathophysiology and self care/management . Financial strain  Armed forces operational officer Clinical Goal(s):  [name] Kitchen Over the next 14 days, patient will demonstrate improved adherence to prescribed treatment plan for diabetes self care/management as evidenced by checking blood glucose levels as prescribed, adhering to ADA/low carb diet, daily exercise, and medication adherence  Interventions:         Reviewed most recent MD note and labs regarding patient's increased A1C . Provided education to patient re: diabetes including A1C results and specifically ADA diet and nutrition . Reviewed medications with patient and discussed importance of medication adherence . Discussed plans with patient for ongoing care management follow up and provided patient with direct contact information for care management team . Provided patient with written educational materials related to hypo and hyperglycemia and importance of correct treatment . Reviewed scheduled/upcoming provider appointments including:  . Advised patient, providing education and rationale, to check cbg twice daily and record   Patient Self Care Activities:  . Self administers medications as prescribed . Attends all scheduled provider appointments . Checks blood sugars as prescribed and utilize hyper and hypoglycemia protocol as needed . Adhere to ADA diet as discussed  Plan:  . RNCM  will provide written educational materials via mail   Initial goal documentation         Telephone follow up appointment with CCM team member scheduled for: next week     Adelai Achey E. Rollene Rotunda, RN, BSN Nurse Care Coordinator [facility] Care Management  [phone]

## 2018-11-08 ENCOUNTER — Ambulatory Visit (INDEPENDENT_AMBULATORY_CARE_PROVIDER_SITE_OTHER): Payer: PPO | Admitting: *Deleted

## 2018-11-08 DIAGNOSIS — E1142 Type 2 diabetes mellitus with diabetic polyneuropathy: Secondary | ICD-10-CM | POA: Diagnosis not present

## 2018-11-08 DIAGNOSIS — M170 Bilateral primary osteoarthritis of knee: Secondary | ICD-10-CM | POA: Diagnosis not present

## 2018-11-08 DIAGNOSIS — F331 Major depressive disorder, recurrent, moderate: Secondary | ICD-10-CM

## 2018-11-08 DIAGNOSIS — Z598 Other problems related to housing and economic circumstances: Secondary | ICD-10-CM

## 2018-11-08 DIAGNOSIS — I129 Hypertensive chronic kidney disease with stage 1 through stage 4 chronic kidney disease, or unspecified chronic kidney disease: Secondary | ICD-10-CM | POA: Diagnosis not present

## 2018-11-08 DIAGNOSIS — Z599 Problem related to housing and economic circumstances, unspecified: Secondary | ICD-10-CM

## 2018-11-08 DIAGNOSIS — E1122 Type 2 diabetes mellitus with diabetic chronic kidney disease: Secondary | ICD-10-CM | POA: Diagnosis not present

## 2018-11-08 DIAGNOSIS — E1165 Type 2 diabetes mellitus with hyperglycemia: Secondary | ICD-10-CM | POA: Diagnosis not present

## 2018-11-08 NOTE — Chronic Care Management (AMB) (Signed)
  Chronic Care Management   Social Work Note  11/08/2018 Name: Charlotte Herrera MRN: [id number] DOB: 07-Jul-1945  Charlotte Herrera is a 73 y.o. year old female who sees Lada, Satira Anis, MD for primary care. The CCM team was consulted for assistance with Mental Health Counseling and Resources.   This Education officer, museum mailed patient requested community resources related to affordable housing, food pantries and grief counseling.  Goals Addressed   None     Follow Up Plan: SW will follow up with patient by phone over the next 2 weeks  SIGNATURE

## 2018-11-08 NOTE — Telephone Encounter (Signed)
Left detailed VM.  

## 2018-11-10 ENCOUNTER — Telehealth: Payer: Self-pay | Admitting: *Deleted

## 2018-11-10 ENCOUNTER — Ambulatory Visit: Payer: Self-pay | Admitting: *Deleted

## 2018-11-10 DIAGNOSIS — Z598 Other problems related to housing and economic circumstances: Secondary | ICD-10-CM

## 2018-11-10 DIAGNOSIS — F331 Major depressive disorder, recurrent, moderate: Secondary | ICD-10-CM

## 2018-11-10 DIAGNOSIS — Z599 Problem related to housing and economic circumstances, unspecified: Secondary | ICD-10-CM

## 2018-11-10 NOTE — Chronic Care Management (AMB) (Signed)
   Chronic Care Management   Unsuccessful Call Note 11/10/2018 Name: Charlotte Herrera MRN: [id number] DOB: [birth year]  [name] is a  year old female who sees Dr. Sanda Klein for primary care. Dr. Sanda Klein asked the CCM team to consult the patient for Mental Health Counseling and Resources.     This social worker was unable to reach patient via telephone today for follow up appointment. I have left HIPAA compliant voicemail asking patient to return my call. (unsuccessful outreach #1).   Plan: Will follow-up within 7 business days via telephone.     Elliot Gurney, [place] [facility] [phone]

## 2018-11-11 ENCOUNTER — Telehealth: Payer: Self-pay | Admitting: Family Medicine

## 2018-11-11 NOTE — Telephone Encounter (Signed)
Copied from [place] [phone]. Topic: General - Inquiry >> Nov 11, 2018  9:16 AM Mathis Bud wrote: Reason for CRM: Patient had an appt 5/5 with Fredderick Severance, NP.  Patient was under the impression she would be getting a shot for her A1C, once a week.  She would like PCP, [place], and nurse to discuss this further.

## 2018-11-14 ENCOUNTER — Ambulatory Visit: Payer: Self-pay

## 2018-11-14 ENCOUNTER — Other Ambulatory Visit: Payer: Self-pay | Admitting: Nurse Practitioner

## 2018-11-14 ENCOUNTER — Other Ambulatory Visit: Payer: Self-pay

## 2018-11-14 ENCOUNTER — Ambulatory Visit: Payer: Self-pay | Admitting: *Deleted

## 2018-11-14 DIAGNOSIS — Z599 Problem related to housing and economic circumstances, unspecified: Secondary | ICD-10-CM

## 2018-11-14 DIAGNOSIS — E1165 Type 2 diabetes mellitus with hyperglycemia: Secondary | ICD-10-CM | POA: Diagnosis not present

## 2018-11-14 DIAGNOSIS — Z598 Other problems related to housing and economic circumstances: Secondary | ICD-10-CM

## 2018-11-14 DIAGNOSIS — M170 Bilateral primary osteoarthritis of knee: Secondary | ICD-10-CM

## 2018-11-14 DIAGNOSIS — E1122 Type 2 diabetes mellitus with diabetic chronic kidney disease: Secondary | ICD-10-CM

## 2018-11-14 DIAGNOSIS — F331 Major depressive disorder, recurrent, moderate: Secondary | ICD-10-CM

## 2018-11-14 DIAGNOSIS — I129 Hypertensive chronic kidney disease with stage 1 through stage 4 chronic kidney disease, or unspecified chronic kidney disease: Secondary | ICD-10-CM

## 2018-11-14 DIAGNOSIS — E1142 Type 2 diabetes mellitus with diabetic polyneuropathy: Secondary | ICD-10-CM | POA: Diagnosis not present

## 2018-11-14 DIAGNOSIS — IMO0002 Reserved for concepts with insufficient information to code with codable children: Secondary | ICD-10-CM

## 2018-11-14 DIAGNOSIS — M1990 Unspecified osteoarthritis, unspecified site: Secondary | ICD-10-CM

## 2018-11-14 MED ORDER — DICLOFENAC SODIUM 1 % TD GEL: 2.0000 g | Freq: Two times a day (BID) | TRANSDERMAL | 2 refills | Status: AC | PRN

## 2018-11-14 NOTE — Patient Instructions (Signed)
Thank you allowing the Chronic Care Management Team to be a part of your care! It was a pleasure speaking with you today!  1. Please continue to practice positive self care 2. Please sign and return the food stamp application and send it the Department of Social Services in [place], [place] [address]       CCM (Chronic Care Management) Team   Trish Fountain RN, BSN Nurse Care Coordinator  [phone]  [name] PharmD  Clinical Pharmacist  [phone]   Elliot Gurney, LCSW Clinical Social Worker [phone]  Goals Addressed            This Visit's Progress   . I am very depressed over the death of my daughter (pt-stated)       Current Barriers:  . Financial constraints . Mental Health Concerns  . Lacks knowledge of community resource: food banks and affordable housing  Clinical Social Work Clinical Goal(s):  [name] Kitchen Over the next 30 days, client will work with SW to address concerns related to depressed mood and community resources  Interventions: . Patient interviewed and appropriate assessments performed . Continued to provide mental health counseling with regard to grief issues related to the loss of her daughter (mental health diagnosis or concern) . Confirmed with patient that the grief, food and affordable housing resources, were mailed to her home for further review . Assisted patient with completing a food stamp application by phone  . Patient advised to mail signed application to the Department of Social Services . Discussed plans with patient for ongoing care management follow up and provided patient with direct contact information for care management team   Patient Self Care Activities:  . Self administers medications as prescribed . Attends all scheduled provider appointments . Performs ADL's independently . Performs IADL's independently . Calls provider office for new concerns or questions  Please see past  updates related to this goal by clicking on the "Past Updates" button in the selected goal          The patient verbalized understanding of instructions provided today and declined a print copy of patient instruction materials.   The CM team will reach out to the patient again over the next 14 days.

## 2018-11-14 NOTE — Telephone Encounter (Signed)
Pt calling wanting to know status of her visit she had with Dr Cecil Cranker. Pt is requesting a call back today.

## 2018-11-14 NOTE — Chronic Care Management (AMB) (Signed)
Chronic Care Management   Follow Up Note   11/14/2018 Name: Charlotte Herrera MRN: [id number] DOB: 11/18/1945  Referred by: Arnetha Courser, MD Reason for referral : Chronic Care Management   Subjective: "I am trying to do my best with eating and exercise by I hurt so bad in my knees and hips I can't walk to the mailbox"   Objective: Lab Results  Component Value Date   HGBA1C 8.3 10/31/2018   HGBA1C 6.3 (H) 04/26/2018   HGBA1C 6.4 (H) 11/12/2017   Lab Results  Component Value Date   MICROALBUR NEG 04/29/2016   LDLCALC 70 04/26/2018   CREATININE 1.0 11/01/2018   Assessment: Charlotte Herrera a 73year old femalewho seesDr.Melinda Ladafor primary care. Dr. Dirk Dress the CCM team to consult the patient formedication affordability and chronic disease management. 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 Referral was placed2/10/2018. Patient's last office visit was2/10/2018. CCM Team met with Charlotte Herrera face to face to establish health goals2/13/2020and has been following Charlotte Herrera since. Today CCM RN CM followedup with Charlotte Herrera to discuss progression towards DM goals  Review of patient status, including review of consultants reports, relevant laboratory and other test results, and collaboration with appropriate care team members and the patient's provider was performed as part of comprehensive patient evaluation and provision of chronic care management services.    Goals Addressed            This Visit's Progress   . I hurt so bad in my knees and hips I can't walk to the mailbox" (pt-stated)       Charlotte Herrera is trying her best to manager her increased A1C with lifestyle modifications. She is trying to eat healthier and attempting to exercise, but she is currently unable to walk to the mailbox because of hip and knee pain. She is utilizing CBD oil topically but seeing no benefits. She is utilizing ice and heat with  some benefit. She is willing to try anything topically as Herrera as it is not menthol based as menthol products cause a skin rash.  Current Barriers:  . Film/video editor.   . Family Support  Nurse Case Manager Clinical Goal(s):   Over the next 14 days, patient will report improved pain in knees and hips  Over the next 7 days, patient will report picking up Voltaren Gel from pharmacy  Over the next 30 days, patient will report being able to walk to the mailbox with greater ease  Interventions:  . Assessed for causative factors for hip and knee pain . Assessed for effects of pain on quality of life . Colloborated with FNP for appropriate treatment for arthritic pain  Patient Self Care Activities:  . Self administers medications as prescribed . Attends all scheduled provider appointments . Calls pharmacy for medication refills . Performs ADL's independently . Calls provider office for new concerns or questions  Initial goal documentation     . I would like to manage my diabetes without adding another medicine (pt-stated)       Charlotte Herrera is attempting to eat healthier although "healthy foods cost too much". She is currently working with [facility] LCSW who will assist her with food stamp application via telephone as patient is concerned with COVID-19 community transmission. She is encouraged to do the best she can, limit her carbs, and focus on lean protein and portion control. She reports fasting blood sugars in the 140s and  150s.  Current Barriers:  [name] Kitchen Knowledge Deficits related to basic Diabetes pathophysiology and self care/management . Financial strain . Arthritic pain in knees and hips  Nurse Case Manager Clinical Goal(s):  [name] Kitchen Over the next 30 days, patient will continue to demonstrate improved adherence to prescribed treatment plan for diabetes self care/management as evidenced by checking blood glucose levels as prescribed, adhering to ADA/low carb diet, daily exercise, and  medication adherence  Interventions:   Assessed barriers to exercise  Assessed compliance with ADA diet and medication adherence  Assessed for food insecurities  Provided emotional support and reassurance   Patient Self Care Activities:  . Self administers medications as prescribed . Attends all scheduled provider appointments . Checks blood sugars as prescribed and utilize hyper and hypoglycemia protocol as needed . Adhere to ADA diet as discussed  Plan:  . Patient will continue to adhere to DM plan of care . RN CM will see if application for food stamps can be accessed online   Please see past updates related to this goal by clicking on the "Past Updates" button in the selected goal          Telephone follow up appointment with CCM team member scheduled for: next week     Tanveer Brammer E. Rollene Rotunda, RN, BSN Nurse Care Coordinator [facility] Care Management  [phone]

## 2018-11-14 NOTE — Patient Instructions (Signed)
Thank you allowing the Chronic Care Management Team to be a part of your care! It was a pleasure speaking with you today!  1. Continue to take all your medications as prescribed. Do not skip doses. 2. Try to monitor your carbohydrate intake and limit to 45 grams per meal. Focus on lean protein and portion control 3. Please pick up your new medication gel for arthritis as soon as you can.  4. I spoke with Education officer, museum and she will assist you with applications for food stamps 5. Try to exercise as much as you can. 6. Drink plenty of water 7. Continue to check your blood sugars as discussed.  CCM (Chronic Care Management) Team   Trish Fountain RN, BSN Nurse Care Coordinator  [phone]  [name] PharmD  Clinical Pharmacist  [phone]   Elliot Gurney, LCSW Clinical Social Worker [phone]  Goals Addressed            This Visit's Progress   . I hurt so bad in my knees and hips I can't walk to the mailbox" (pt-stated)       Current Barriers:  . Film/video editor.   . Family Support  Nurse Case Manager Clinical Goal(s):   Over the next 14 days, patient will report improved pain in knees and hips  Over the next 7 days, patient will report picking up Voltaren Gel from pharmacy  Over the next 30 days, patient will report being able to walk to the mailbox with greater ease  Interventions:  . Assessed for causative factors for hip and knee pain . Assessed for effects of pain on quality of life . Colloborated with FNP for appropriate treatment for arthritic pain  Patient Self Care Activities:  . Self administers medications as prescribed . Attends all scheduled provider appointments . Calls pharmacy for medication refills . Performs ADL's independently . Calls provider office for new concerns or questions  Initial goal documentation     . I would like to manage my diabetes without adding another medicine (pt-stated)       Current Barriers:   [name] Kitchen Knowledge Deficits related to basic Diabetes pathophysiology and self care/management . Financial strain . Arthritic pain in knees and hips  Nurse Case Manager Clinical Goal(s):  [name] Kitchen Over the next 30 days, patient will continue to demonstrate improved adherence to prescribed treatment plan for diabetes self care/management as evidenced by checking blood glucose levels as prescribed, adhering to ADA/low carb diet, daily exercise, and medication adherence  Interventions:   Assessed barriers to exercise  Assessed compliance with ADA diet and medication adherence  Assessed for food insecurities  Provided emotional support and reassurance   Patient Self Care Activities:  . Self administers medications as prescribed . Attends all scheduled provider appointments . Checks blood sugars as prescribed and utilize hyper and hypoglycemia protocol as needed . Adhere to ADA diet as discussed  Plan:  . Patient will continue to adhere to DM plan of care . RN CM will see if application for food stamps can be accessed online   Please see past updates related to this goal by clicking on the "Past Updates" button in the selected goal         The patient verbalized understanding of instructions provided today and declined a print copy of patient instruction materials.   Telephone follow up appointment with CCM team member scheduled for: next week  SYMPTOMS OF A STROKE   You have any symptoms of stroke. "BE FAST"  is an easy way to remember the main warning signs: ? B - Balance. Signs are dizziness, sudden trouble walking, or loss of balance. ? E - Eyes. Signs are trouble seeing or a sudden change in how you see. ? F - Face. Signs are sudden weakness or loss of feeling of the face, or the face or eyelid drooping on one side. ? A - Arms. Signs are weakness or loss of feeling in an arm. This happens suddenly and usually on one side of the body. ? S - Speech. Signs are sudden trouble speaking,  slurred speech, or trouble understanding what people say. ? T - Time. Time to call emergency services. Write down what time symptoms started.  You have other signs of stroke, such as: ? A sudden, very bad headache with no known cause. ? Feeling sick to your stomach (nausea). ? Throwing up (vomiting). ? Jerky movements you cannot control (seizure).  SYMPTOMS OF A HEART ATTACK  What are the signs or symptoms? Symptoms of this condition include:  Chest pain. It may feel like: ? Crushing or squeezing. ? Tightness, pressure, fullness, or heaviness.  Pain in the arm, neck, jaw, back, or upper body.  Shortness of breath.  Heartburn.  Indigestion.  Nausea.  Cold sweats.  Feeling tired.  Sudden lightheadedness.

## 2018-11-14 NOTE — Chronic Care Management (AMB) (Signed)
  Chronic Care Management    Clinical Social Work Follow Up Note  11/14/2018 Name: Charlotte Herrera MRN: [id number] DOB: [birth year]  Charlotte Herrera is a 73 y.o. year old female who is a primary care patient of Lada, Satira Anis, MD. The CCM team was consulted for assistance with Mental Health Counseling and Resources.   Review of patient status, including review of consultants reports, other relevant assessments, and collaboration with appropriate care team members and the patient's provider was performed as part of comprehensive patient evaluation and provision of chronic care management services.     Goals Addressed            This Visit's Progress   . I am very depressed over the death of my daughter (pt-stated)       This social worker spoke to patient by phone today. Patient stated that she continues to struggle with her daughters death, however she makes efforts to stay busy. Per patient, her grand daughter came over today and they painted rocks which helped her take her mind off things. Patient further discussed relief that her upstairs neighbor has moved out and her rent has been decreased to an affordable $300.00. Per patient, she working on making payment arrangements in regards to her car payment and utilities. Patient confirmed that she is not behind on any household bills at this time.  Current Barriers:  . Financial constraints . Mental Health Concerns  . Lacks knowledge of community resource: food banks and affordable housing  Clinical Social Work Clinical Goal(s):  [name] Kitchen Over the next 30 days, client will work with SW to address concerns related to depressed mood and community resources  Interventions: . Patient interviewed and appropriate assessments performed . Continued to provide mental health counseling with regard to grief issues related to the loss of her daughter (mental health diagnosis or concern) . Confirmed with patient that the grief, food and affordable housing resources, were  mailed to her home for further review . Assisted patient with completing a food stamp application by phone  . Patient advised to mail signed application to the Department of Social Services . Discussed plans with patient for ongoing care management follow up and provided patient with direct contact information for care management team   Patient Self Care Activities:  . Self administers medications as prescribed . Attends all scheduled provider appointments . Performs ADL's independently . Performs IADL's independently . Calls provider office for new concerns or questions  Please see past updates related to this goal by clicking on the "Past Updates" button in the selected goal          Follow Up Plan: SW will follow up with patient by phone over the next 2 weeks   [place], [place] Worker  [facility] Care Management [phone]

## 2018-11-14 NOTE — Telephone Encounter (Signed)
Called by [place] and updated on plan of care

## 2018-11-17 ENCOUNTER — Ambulatory Visit: Payer: Self-pay | Admitting: *Deleted

## 2018-11-17 DIAGNOSIS — Z598 Other problems related to housing and economic circumstances: Secondary | ICD-10-CM

## 2018-11-17 DIAGNOSIS — F331 Major depressive disorder, recurrent, moderate: Secondary | ICD-10-CM

## 2018-11-17 DIAGNOSIS — Z599 Problem related to housing and economic circumstances, unspecified: Secondary | ICD-10-CM

## 2018-11-17 LAB — COMPREHENSIVE METABOLIC PANEL
ALT: 22 IU/L (ref 0–32)
AST: 28 IU/L (ref 0–40)
Albumin/Globulin Ratio: 1.4 (ref 1.2–2.2)
Albumin: 4.3 g/dL (ref 3.7–4.7)
Alkaline Phosphatase: 67 IU/L (ref 39–117)
BUN/Creatinine Ratio: 17 (ref 12–28)
BUN: 18 mg/dL (ref 8–27)
Bilirubin Total: <0.2 mg/dL (ref 0.0–1.2)
CO2: 20 mmol/L (ref 20–29)
Calcium: 10 mg/dL (ref 8.7–10.3)
Chloride: 102 mmol/L (ref 96–106)
Creatinine, Ser: 1.04 mg/dL — ABNORMAL HIGH (ref 0.57–1.00)
GFR calc Af Amer: 62 mL/min/{1.73_m2} (ref >59)
GFR calc non Af Amer: 53 mL/min/{1.73_m2} — ABNORMAL LOW (ref >59)
Globulin, Total: 3 g/dL (ref 1.5–4.5)
Glucose: 149 mg/dL — ABNORMAL HIGH (ref 65–99)
Potassium: 4.5 mmol/L (ref 3.5–5.2)
Sodium: 140 mmol/L (ref 134–144)
Total Protein: 7.3 g/dL (ref 6.0–8.5)

## 2018-11-17 LAB — SPECIMEN STATUS REPORT

## 2018-11-17 NOTE — Patient Instructions (Signed)
Thank you allowing the Chronic Care Management Team to be a part of your care! It was a pleasure speaking with you today!  1. Please sign food stamp application and return to the Department of Social Services at the following address [place], [place] [address] 2. Please feel free to call your CCM team members with any questions or concerns.  CCM (Chronic Care Management) Team   Trish Fountain RN, BSN Nurse Care Coordinator  [phone]  [name] PharmD  Clinical Pharmacist  [phone]   [place], LCSW Clinical Social Worker [phone]  Goals Addressed   None      The patient verbalized understanding of instructions provided today and declined a print copy of patient instruction materials.   The CM team will reach out to the patient again over the next 14 days.

## 2018-11-17 NOTE — Chronic Care Management (AMB) (Signed)
  Chronic Care Management   Social Work Note  11/17/2018 Name: Charlotte Herrera MRN: [id number] DOB: 1945/12/03  [name] is a 73 y.o. year old female who sees Lada, Satira Anis, MD for primary care. The CCM team was consulted for assistance with Mental Health Counseling and Resources.  However during the course of our discussion on 11/14/2018, it was identified that patient needed to apply for food stamps. This social worker completed the application with patient by phone on 11/14/2018 and mailed it to her today for signature and request to return  the completed application to Department of Social Services.  Goals Addressed   None     Follow Up Plan: SW will follow up with patient by phone over the next 2 weeks  [place], [place] Worker  [facility] Care Management [phone]

## 2018-11-25 ENCOUNTER — Other Ambulatory Visit: Payer: Self-pay

## 2018-11-25 ENCOUNTER — Ambulatory Visit: Payer: Self-pay

## 2018-11-25 DIAGNOSIS — F331 Major depressive disorder, recurrent, moderate: Secondary | ICD-10-CM | POA: Diagnosis not present

## 2018-11-25 DIAGNOSIS — E1142 Type 2 diabetes mellitus with diabetic polyneuropathy: Secondary | ICD-10-CM | POA: Diagnosis not present

## 2018-11-25 DIAGNOSIS — M170 Bilateral primary osteoarthritis of knee: Secondary | ICD-10-CM | POA: Diagnosis not present

## 2018-11-25 DIAGNOSIS — Z598 Other problems related to housing and economic circumstances: Secondary | ICD-10-CM

## 2018-11-25 DIAGNOSIS — Z599 Problem related to housing and economic circumstances, unspecified: Secondary | ICD-10-CM

## 2018-11-25 DIAGNOSIS — E1165 Type 2 diabetes mellitus with hyperglycemia: Secondary | ICD-10-CM | POA: Diagnosis not present

## 2018-11-25 DIAGNOSIS — IMO0002 Reserved for concepts with insufficient information to code with codable children: Secondary | ICD-10-CM

## 2018-11-25 DIAGNOSIS — E1122 Type 2 diabetes mellitus with diabetic chronic kidney disease: Secondary | ICD-10-CM | POA: Diagnosis not present

## 2018-11-25 DIAGNOSIS — I129 Hypertensive chronic kidney disease with stage 1 through stage 4 chronic kidney disease, or unspecified chronic kidney disease: Secondary | ICD-10-CM | POA: Diagnosis not present

## 2018-11-25 NOTE — Patient Instructions (Signed)
Thank you allowing the Chronic Care Management Team to be a part of your care! It was a pleasure speaking with you today!  1. Continue to take your medications as prescribed 2. I am so proud of how well you are managing your diabetes!! Keep up the good work. 3. Use the Voltaren topical cream, heat, ice, whatever reduces the pain in your hip and knee 4. Please discuss with Benjamine Mola your desire to see an orthopedist at your next visit. 5. Continue to check your blood sugars and record 6. Follow the ADA diet to the best of your ability with the limited funds you have for "healthy foods"  CCM (Chronic Care Management) Team   Trish Fountain RN, BSN Nurse Care Coordinator  [phone]  [name] PharmD  Clinical Pharmacist  [phone]   [place], [place] Social Worker [phone]  Goals Addressed            This Visit's Progress   . I hurt so bad in my knees and hips I can't walk to the mailbox" (pt-stated)       Current Barriers:  . Film/video editor.   . Family Support  Nurse Case Manager Clinical Goal(s):   Over the next 14 days, patient will report improved pain in knees and hips-goal met 11/25/2018  Over the next 7 days, patient will report picking up Voltaren Gel from pharmacy-goal met 11/25/2018  Over the next 30 days, patient will report being able to walk to the mailbox with greater ease  Over the next 14 days, patient will discuss Hip pain with provider at next routien visit 12/06/2018  Interventions:  . Assessed for causative factors for hip and knee pain . Assessed for effects of pain on quality of life . Assessed for utilization and effectiveness of Voltaren topical medication . Encouraged patient to discuss possible referral to ortho during next office visit 12/06/2018  Patient Self Care Activities:  . Self administers medications as prescribed . Attends all scheduled provider appointments . Calls pharmacy for medication  refills . Performs ADL's independently . Calls provider office for new concerns or questions  Please see past updates related to this goal by clicking on the "Past Updates" button in the selected goal      . I would like to manage my diabetes without adding another medicine (pt-stated)       Current Barriers:  [name] Kitchen Knowledge Deficits related to basic Diabetes pathophysiology and self care/management . Financial strain . Arthritic pain in knees and hips  Nurse Case Manager Clinical Goal(s):  [name] Kitchen Over the next 30 days, patient will continue to demonstrate improved adherence to prescribed treatment plan for diabetes self care/management as evidenced by checking blood glucose levels as prescribed, adhering to ADA/low carb diet, daily exercise, and medication adherence  Interventions:   Assessed barriers to exercise  Assessed compliance with ADA diet and medication adherence  Assessed for completion of food stamp application  Provided emotional support and reassurance   Reviewed recent blood sugar readings  Patient Self Care Activities:  . Self administers medications as prescribed . Attends all scheduled provider appointments . Checks blood sugars as prescribed and utilize hyper and hypoglycemia protocol as needed . Adhere to ADA diet as discussed  Plan:  . Patient will continue to adhere to DM plan of care    Please see past updates related to this goal by clicking on the "Past Updates" button in the selected goal  The patient verbalized understanding of instructions provided today and declined a print copy of patient instruction materials.   Telephone follow up appointment with care management team member scheduled for: 30 days  SYMPTOMS OF A STROKE   You have any symptoms of stroke. "BE FAST" is an easy way to remember the main warning signs: ? B - Balance. Signs are dizziness, sudden trouble walking, or loss of balance. ? E - Eyes. Signs are trouble seeing or a  sudden change in how you see. ? F - Face. Signs are sudden weakness or loss of feeling of the face, or the face or eyelid drooping on one side. ? A - Arms. Signs are weakness or loss of feeling in an arm. This happens suddenly and usually on one side of the body. ? S - Speech. Signs are sudden trouble speaking, slurred speech, or trouble understanding what people say. ? T - Time. Time to call emergency services. Write down what time symptoms started.  You have other signs of stroke, such as: ? A sudden, very bad headache with no known cause. ? Feeling sick to your stomach (nausea). ? Throwing up (vomiting). ? Jerky movements you cannot control (seizure).  SYMPTOMS OF A HEART ATTACK  What are the signs or symptoms? Symptoms of this condition include:  Chest pain. It may feel like: ? Crushing or squeezing. ? Tightness, pressure, fullness, or heaviness.  Pain in the arm, neck, jaw, back, or upper body.  Shortness of breath.  Heartburn.  Indigestion.  Nausea.  Cold sweats.  Feeling tired.  Sudden lightheadedness.

## 2018-11-25 NOTE — Chronic Care Management (AMB) (Signed)
Chronic Care Management   Follow Up Note   11/25/2018 Name: Charlotte Herrera MRN: [id number] DOB: [birth year]  Referred by: Arnetha Courser, MD Reason for referral : Chronic Care Management (follow up on hip pain)   Subjective: "My blood sugars are real good" "I was up all night with my hip hurting but that Voltaren helped some"   Objective: Lab Results  Component Value Date   HGBA1C 8.3 10/31/2018   HGBA1C 6.3 (H) 04/26/2018   HGBA1C 6.4 (H) 11/12/2017   Lab Results  Component Value Date   MICROALBUR NEG 04/29/2016   LDLCALC 70 04/26/2018   CREATININE 1.0 11/01/2018    Assessment: Charlotte Herrera a 73year old femalewho seesDr.Melinda Ladafor primary care. Dr. Dirk Dress the CCM team to consult the patient formedication affordability and chronic disease management. 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 Referral was placed2/10/2018. Patient's last office visit was2/10/2018. CCM Team met with Charlotte Herrera face to face to establish health goals2/13/2020and has been following Charlotte Herrera since. Today CCM RN CM followedup with Charlotte Herrera discuss progression towards DM goals  Review of patient status, including review of consultants reports, relevant laboratory and other test results, and collaboration with appropriate care team members and the patient's provider was performed as part of comprehensive patient evaluation and provision of chronic care management services.    Goals Addressed            This Visit's Progress    I hurt so bad in my knees and hips I can't walk to the mailbox" (pt-stated)       Charlotte Herrera has picked up her prescription of Voltaren since last telephone visit. She states she has used it several times and it helps with the pain. Last night, she did not sleep at all because the pain in her hip was so bad. She utilized Voltaren topical and got some relief. She states her sister has the  same pain and recently went to the orthopedic to receive an injection in her hip. Charlotte Herrera is questioning if this treatment may help her as her pain in her hip is severe enough that she is unable to walk to her mailbox and complete ADLs without discomfort.   Current Barriers:   Film/video editor.    Family Support  Nurse Case Manager Clinical Goal(s):   Over the next 14 days, patient will report improved pain in knees and hips-goal met 11/25/2018  Over the next 7 days, patient will report picking up Voltaren Gel from pharmacy-goal met 11/25/2018  Over the next 30 days, patient will report being able to walk to the mailbox with greater ease  Over the next 14 days, patient will discuss Hip pain with provider at next routien visit 12/06/2018  Interventions:   Assessed for causative factors for hip and knee pain  Assessed for effects of pain on quality of life  Assessed for utilization and effectiveness of Voltaren topical medication  Encouraged patient to discuss possible referral to ortho during next office visit 12/06/2018  Patient Self Care Activities:   Self administers medications as prescribed  Attends all scheduled provider appointments  Calls pharmacy for medication refills  Performs ADL's independently  Calls provider office for new concerns or questions  Please see past updates related to this goal by clicking on the "Past Updates" button in the selected goal       I would like to manage my diabetes without adding  another medicine (pt-stated)       Charlotte Herrera continues to do very well following her DM self care plan. She is limited on the healthy foods she can buy as she is no longer working her part-time job. She is being assisted by Clinic LCSW for conseling and financial needs. She has completed and mailed in her application for food stamps which will help her buy fresh foods. She is reading food labels and trying to limit her carbohydrates. She is checking her  sugars daily and reports numbers of 105, 114, 112. She does not eat past 6pm and appreciates an improvement in her "stomach bloat". If she could improve on one thing she reports, it would be the ability to exercise (limited by hip and knee pain)   Current Barriers:   Knowledge Deficits related to basic Diabetes pathophysiology and self care/management  Financial strain  Arthritic pain in knees and hips  Nurse Case Manager Clinical Goal(s):   Over the next 30 days, patient will continue to demonstrate improved adherence to prescribed treatment plan for diabetes self care/management as evidenced by checking blood glucose levels as prescribed, adhering to ADA/low carb diet, daily exercise, and medication adherence  Interventions:   Assessed barriers to exercise  Assessed compliance with ADA diet and medication adherence  Assessed for completion of food stamp application  Provided emotional support and reassurance   Reviewed recent blood sugar readings  Patient Self Care Activities:   Self administers medications as prescribed  Attends all scheduled provider appointments  Checks blood sugars as prescribed and utilize hyper and hypoglycemia protocol as needed  Adhere to ADA diet as discussed  Plan:   Patient will continue to adhere to DM plan of care    Please see past updates related to this goal by clicking on the "Past Updates" button in the selected goal          Telephone follow up appointment with RN CM in 30 days   Bedelia Pong E. Rollene Rotunda, RN, BSN Nurse Care Coordinator [facility] Care Management  [phone]

## 2018-11-28 ENCOUNTER — Ambulatory Visit (INDEPENDENT_AMBULATORY_CARE_PROVIDER_SITE_OTHER): Payer: PPO | Admitting: *Deleted

## 2018-11-28 DIAGNOSIS — F331 Major depressive disorder, recurrent, moderate: Secondary | ICD-10-CM

## 2018-11-28 DIAGNOSIS — Z599 Problem related to housing and economic circumstances, unspecified: Secondary | ICD-10-CM

## 2018-11-28 DIAGNOSIS — Z598 Other problems related to housing and economic circumstances: Secondary | ICD-10-CM

## 2018-11-29 NOTE — Chronic Care Management (AMB) (Addendum)
  Chronic Care Management    Clinical Social Work Follow Up Note  11/29/2018 Name: Charlotte Herrera MRN: [id number] DOB: 1945/11/22  Charlotte Herrera is a 73 y.o. year old female who is a primary care patient of Lada, Satira Anis, MD. The CCM team was consulted for assistance with Mental Health Counseling and Resources related to food insecurity.   Review of patient status, including review of consultants reports, other relevant assessments, and collaboration with appropriate care team members and the patient's provider was performed as part of comprehensive patient evaluation and provision of chronic care management services.     Goals Addressed            This Visit's Progress   . I am very depressed over the death of my daughter (pt-stated)       Current Barriers:  . Financial constraints . Mental Health Concerns  . Lacks knowledge of community resource: food banks and affordable housing  Clinical Social Work Clinical Goal(s):  [name] Kitchen Over the next 30 days, client will work with SW to address concerns related to depressed mood and community resources  Interventions: . Patient interviewed and appropriate assessments performed . Continued to provide mental health counseling with regard to grief issues related to the loss of her daughter (mental health diagnosis or concern) . Confirmed with patient that she has received the grief, food and affordable housing resources, were mailed to her home for further review . Patient confirmed plan to apply for unemployment . Patient confirmed that she has returned the food stamp application but has not heard anything back yet.  . Discussed plans with patient for ongoing care management follow up and provided patient with direct contact information for care management team   Patient Self Care Activities:  . Self administers medications as prescribed . Attends all scheduled provider appointments . Performs ADL's independently . Performs IADL's independently . Calls  provider office for new concerns or questions  Please see past updates related to this goal by clicking on the "Past Updates" button in the selected goal          Follow Up Plan: SW will follow up with patient by phone over the next 2 weeks   [place], [place] [facility] Worker  [facility] Care Management [phone]

## 2018-11-29 NOTE — Patient Instructions (Signed)
Thank you allowing the Chronic Care Management Team to be a part of your care! It was a pleasure speaking with you today!  1. Please follow up with the Department of Social Services regarding the status of your food stamp application. 2. Please continue to practice self care.     CCM (Chronic Care Management) Team   Trish Fountain RN, BSN Nurse Care Coordinator  [phone]  [name] PharmD  Clinical Pharmacist  [phone]   Elliot Gurney, LCSW Clinical Social Worker [phone]  Goals Addressed            This Visit's Progress   . I am very depressed over the death of my daughter (pt-stated)       Current Barriers:  . Financial constraints . Mental Health Concerns  . Lacks knowledge of community resource: food banks and affordable housing  Clinical Social Work Clinical Goal(s):  [name] Kitchen Over the next 30 days, client will work with SW to address concerns related to depressed mood and community resources  Interventions: . Patient interviewed and appropriate assessments performed . Continued to provide mental health counseling with regard to grief issues related to the loss of her daughter (mental health diagnosis or concern) . Confirmed with patient that she has received the grief, food and affordable housing resources, were mailed to her home for further review . Patient confirmed that she has returned the food stamp application but has not heard anything back yet.  . Discussed plans with patient for ongoing care management follow up and provided patient with direct contact information for care management team   Patient Self Care Activities:  . Self administers medications as prescribed . Attends all scheduled provider appointments . Performs ADL's independently . Performs IADL's independently . Calls provider office for new concerns or questions  Please see past updates related to this goal by clicking on the "Past Updates" button in the selected goal           The patient verbalized understanding of instructions provided today and declined a print copy of patient instruction materials.   The care management team will reach out to the patient again over the next 14 days days.

## 2018-12-06 ENCOUNTER — Other Ambulatory Visit: Payer: Self-pay

## 2018-12-06 ENCOUNTER — Encounter: Payer: Self-pay | Admitting: Nurse Practitioner

## 2018-12-06 ENCOUNTER — Ambulatory Visit (INDEPENDENT_AMBULATORY_CARE_PROVIDER_SITE_OTHER): Payer: PPO | Admitting: Nurse Practitioner

## 2018-12-06 VITALS — BP 102/58 | HR 79 | Temp 98.2°F | Resp 12 | Ht 64.0 in | Wt 164.6 lb

## 2018-12-06 DIAGNOSIS — Z23 Encounter for immunization: Secondary | ICD-10-CM | POA: Diagnosis not present

## 2018-12-06 DIAGNOSIS — I952 Hypotension due to drugs: Secondary | ICD-10-CM

## 2018-12-06 DIAGNOSIS — B351 Tinea unguium: Secondary | ICD-10-CM | POA: Diagnosis not present

## 2018-12-06 DIAGNOSIS — M25552 Pain in left hip: Secondary | ICD-10-CM

## 2018-12-06 DIAGNOSIS — E1142 Type 2 diabetes mellitus with diabetic polyneuropathy: Secondary | ICD-10-CM

## 2018-12-06 DIAGNOSIS — G8929 Other chronic pain: Secondary | ICD-10-CM

## 2018-12-06 DIAGNOSIS — IMO0002 Reserved for concepts with insufficient information to code with codable children: Secondary | ICD-10-CM

## 2018-12-06 DIAGNOSIS — E1165 Type 2 diabetes mellitus with hyperglycemia: Secondary | ICD-10-CM | POA: Diagnosis not present

## 2018-12-06 MED ORDER — LOSARTAN POTASSIUM 25 MG PO TABS: 25.0000 mg | ORAL_TABLET | Freq: Every day | ORAL | 0 refills | Status: DC

## 2018-12-06 NOTE — Progress Notes (Signed)
Name: Charlotte Herrera   MRN: [id number]    DOB: March 15, 1946   Date:12/06/2018       Progress Note  Subjective  Chief Complaint  Chief Complaint  Patient presents with  . Follow-up  . Referral    Podiatry- toe nails clipped    HPI  Patient presents diabetes follow-up She takes metformin 1074m BID and lantus 42 units daily She has been eating poorly during the pandemic to increase stress and eating more due to boredom. Last month patient stated she would really work on diet to increase control to prevent starting a new medication. She has been working on diet and has been snacking less, does not eat after 6pm and if she does its something healthy.  Fasting blood sugars have been 102-141 in the past 2 weeks- typically around 11-118.  Paitient does daily foot checks, right foot third toe nail is Dragon and thick.   Lab Results  Component Value Date   HGBA1C 8.3 10/31/2018    Patient endorses left hip pain, both hurt but worse on the left. Pain has been chronic- ongoing for 20 years but has progressively getting worse. Worse when weather is cold or rainy. She is using voltaren gel with relief when it gets too painful and it helps well.   PHQ2/9: [facility] 12/06/2018 11/03/2018 11/01/2018 07/07/2018 02/01/2018  Decreased Interest 0 0 0 0 0  Down, Depressed, Hopeless 0 0 2 1 3   PHQ - 2 Score 0 0 2 1 3   Altered sleeping 0 - 2 3 3   Tired, decreased energy 0 - 2 3 3   Change in appetite 0 - 0 1 3  Feeling bad or failure about yourself  0 - 0 0 0  Trouble concentrating 0 - 0 0 0  Moving slowly or fidgety/restless 0 - 0 0 0  Suicidal thoughts 0 - 0 0 0  PHQ-9 Score 0 - 6 8 12   Difficult doing work/chores Not difficult at all - Not difficult at all Somewhat difficult Somewhat difficult  Some recent data might be hidden     PHQ reviewed. Negative  Patient Active Problem List   Diagnosis Date Noted  . Abnormal weight loss   . Diarrhea   . Gastritis without bleeding   . Carotid  stenosis 01/04/2018  . Pain in limb 01/04/2018  . Microalbuminuria 12/14/2017  . 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 Need for hepatitis A and B vaccination 10/12/2017  . Anxiety, generalized 09/03/2017  . Depression 09/03/2017  . Calcification of abdominal aorta (HCC) 09/03/2017  . Cirrhosis ([place] 09/03/2017  . Hx of transient ischemic attack (TIA) 07/06/2017  . H/O carotid endarterectomy 05/28/2017  . Plantar fasciitis 11/27/2016  . Transaminitis 03/11/2016  . Environmental and seasonal allergies 12/05/2015  . Lipoma of abdominal wall 12/05/2015  . Dark urine 12/05/2015  . Abdominal mass, left lower quadrant 11/05/2015  . Heel pain 08/05/2015  . Osteoarthritis of knee 08/05/2015  . Chronic gout involving toe of right foot without tophus 05/28/2015  . Hypothyroidism 05/14/2015  . Pain and swelling of toe of left foot 04/04/2015  . Impingement syndrome of shoulder region 01/17/2015  . Lateral epicondylitis 01/17/2015  . Bilateral chronic knee pain 01/15/2015  . Vitamin B12 deficiency 12/25/2014  . Vitamin D deficiency 12/25/2014  . Uncontrolled type 2 diabetes mellitus with peripheral neuropathy ([facility] 12/25/2014  . [place] intertrigo 12/25/2014  . Hx of transient ischemic attack (TIA) 12/25/2014  .  Abnormal liver enzymes 12/25/2014    Past Medical History:  Diagnosis Date  . Allergy   . Arthritis    hands, shoulders, knees, feet  . Calcification of abdominal aorta (HCC) 09/03/2017   CT scan [facility] Feb 2019  . Cirrhosis ([place]) 09/03/2017   Chest CT [facility] Feb 2019  . Depression   . Diabetes mellitus, type 2 ([place])   . Enlarged liver   . Family history of adverse reaction to anesthesia    Mother - PONV  . Fibromyalgia   . Fibromyalgia affecting multiple sites    pinched nerve in back of neck  . GERD (gastroesophageal reflux disease)   . Headache   . Hyperlipidemia   . Hypertension   . Hypothyroidism   . Motion sickness    . PONV (postoperative nausea and vomiting)   . Stroke ([place])   . Thyroid disease   . TIA (transient ischemic attack)   . Vertigo    after cataract surgery    Past Surgical History:  Procedure Laterality Date  . ABDOMINAL HYSTERECTOMY    . CARPAL TUNNEL RELEASE Left   . COLONOSCOPY  2015  . COLONOSCOPY WITH PROPOFOL N/A 11/05/2017   Procedure: COLONOSCOPY WITH PROPOFOL;  Surgeon: Lucilla Lame, MD;  Location: [place];  Service: Endoscopy;  Laterality: N/A;  Diabetic  . COLONOSCOPY WITH PROPOFOL N/A 02/25/2018   Procedure: COLONOSCOPY WITH PROPOFOL;  Surgeon: Lucilla Lame, MD;  Location: [place];  Service: Endoscopy;  Laterality: N/A;  . ESOPHAGOGASTRODUODENOSCOPY (EGD) WITH PROPOFOL N/A 11/05/2017   Procedure: ESOPHAGOGASTRODUODENOSCOPY (EGD) WITH PROPOFOL;  Surgeon: Lucilla Lame, MD;  Location: [place];  Service: Endoscopy;  Laterality: N/A;  . ESOPHAGOGASTRODUODENOSCOPY (EGD) WITH PROPOFOL N/A 02/25/2018   Procedure: ESOPHAGOGASTRODUODENOSCOPY (EGD) WITH PROPOFOL;  Surgeon: Lucilla Lame, MD;  Location: [place];  Service: Endoscopy;  Laterality: N/A;  diabetic - insulin and oral meds  . SPINE SURGERY    . VARICOSE VEIN SURGERY Left 1975    Social History   Tobacco Use  . Smoking status: Never Smoker  . Smokeless tobacco: Never Used  . Tobacco comment: smoking cessation materials not required  Substance Use Topics  . Alcohol use: No     Current Outpatient Medications:  .  buPROPion (WELLBUTRIN XL) 150 MG 24 hr tablet, Take 150 mg by mouth daily., Disp: , Rfl:  .  cetirizine (ZYRTEC) 10 MG tablet, Take 10 mg by mouth daily as needed for allergies., Disp: , Rfl:  .  clopidogrel (PLAVIX) 75 MG tablet, Take 1 tablet (75 mg total) by mouth daily., Disp: 90 tablet, Rfl: 3 .  diclofenac sodium (VOLTAREN) 1 % GEL, Apply 2 g topically 2 (two) times daily as needed., Disp: 100 g, Rfl: 2 .  fluticasone (FLONASE) 50 MCG/ACT nasal spray, Place 2  sprays into both nostrils daily., Disp: 16 g, Rfl: 11 .  gabapentin (NEURONTIN) 300 MG capsule, TAKE 1 CAPSULE(300 MG) BY MOUTH AT BEDTIME, Disp: 90 capsule, Rfl: 1 .  Insulin Glargine (LANTUS SOLOSTAR) 100 UNIT/ML Solostar Pen, Inject 40 Units into the skin at bedtime. (Patient taking differently: Inject 42 Units into the skin at bedtime. ), Disp: 5 pen, Rfl: 11 .  ketoconazole (NIZORAL) 2 % cream, APPLY TOPICALLY TO THE AFFECTED AREA DAILY AS NEEDED FOR IRRITATION( WHERE NEEDED), Disp: 60 g, Rfl: 2 .  levothyroxine (SYNTHROID) 50 MCG tablet, Take 1 tablet (50 mcg total) by mouth daily before breakfast., Disp: 90 tablet, Rfl: 1 .  losartan (COZAAR) 50 MG  tablet, Take 1 tablet (50 mg total) by mouth daily., Disp: 90 tablet, Rfl: 3 .  metFORMIN (GLUCOPHAGE) 500 MG tablet, Take 1 tablet (500 mg total) by mouth 2 (two) times daily with a meal., Disp: 60 tablet, Rfl: 2 .  pantoprazole (PROTONIX) 20 MG tablet, TAKE 1 TABLET BY MOUTH DAILY FOR HEARTBURN, REFLUX. PROLONGED USE MAY INCREASE RISK OF PNEUMONIA, COLITIS, OSTEOPOROSIS, ANEMIA, Disp: 90 tablet, Rfl: 0 .  pravastatin (PRAVACHOL) 20 MG tablet, Take 1 tablet (20 mg total) by mouth at bedtime. For cholesterol, Disp: 90 tablet, Rfl: 3 .  traZODone (DESYREL) 50 MG tablet, Take 50 mg by mouth at bedtime., Disp: , Rfl:  .  Blood Glucose Monitoring Suppl (ONE TOUCH ULTRA MINI) w/Device KIT, 1 Device by Does not apply route 2 (two) times daily., Disp: 1 each, Rfl: 0 .  glucose blood (ONE TOUCH ULTRA TEST) test strip, TEST twice a day, Disp: 180 each, Rfl: 3 .  Insulin Pen Needle (BD PEN NEEDLE MICRO U/F) 32G X 6 MM MISC, USE AS DIRECTED once AT BEDTIME for insulin administration DX:E11.42 Lon:99 months, Disp: 100 each, Rfl: 3 .  OneTouch Delica Lancets 67T MISC, 33 g by Does not apply route 2 (two) times daily. Test twice a day as directed, Disp: 100 each, Rfl: 2  Allergies  Allergen Reactions  . Aspirin   . Duloxetine Hcl   . Penicillins   . Codeine  Nausea And Vomiting and Rash  . Lyrica [Pregabalin] Nausea And Vomiting  . Sulfa Antibiotics Swelling    ROS   No other specific complaints in a complete review of systems (except as listed in HPI above).  Objective  Vitals:   12/06/18 1310  BP: (!) 102/58  Pulse: 79  Resp: 12  Temp: 98.2 F (36.8 C)  TempSrc: Oral  SpO2: 98%  Weight: 164 lb 9.6 oz (74.7 kg)  Height: 5' 4"  (1.626 m)    Body mass index is 28.25 kg/m.  Nursing Note and Vital Signs reviewed.  Physical Exam Constitutional:      Appearance: Normal appearance. She is well-developed.  HENT:     Head: Normocephalic and atraumatic.     Right Ear: Hearing normal.     Left Ear: Hearing normal.  Eyes:     Conjunctiva/sclera: Conjunctivae normal.  Cardiovascular:     Rate and Rhythm: Normal rate and regular rhythm.     Pulses: Normal pulses.     Heart sounds: Normal heart sounds.  Pulmonary:     Effort: Pulmonary effort is normal.     Breath sounds: Normal breath sounds.  Abdominal:     General: Abdomen is flat. Bowel sounds are normal.     Tenderness: There is no abdominal tenderness.  Skin:    General: Skin is warm and dry.  Neurological:     Mental Status: She is alert and oriented to person, place, and time.  Psychiatric:        Speech: Speech normal.        Behavior: Behavior normal. Behavior is cooperative.        Thought Content: Thought content normal.        Judgment: Judgment normal.      Diabetic Foot Exam - Simple   Simple Foot Form Diabetic Foot exam was performed with the following findings:  Yes 12/06/2018  1:34 PM  Visual Inspection No deformities, no ulcerations, no other skin breakdown bilaterally:  Yes Sensation Testing See comments:  Yes Pulse Check Posterior Tibialis and Dorsalis  pulse intact bilaterally:  Yes Comments Right foot third digit thick, [place], hard nail. Patient has no microfilament feeling in bilateral legs except for middle of right leg.        No  results found for this or any previous visit (from the past 48 hour(s)).  Assessment & Plan  1. Uncontrolled type 2 diabetes mellitus with peripheral neuropathy (HCC) Discussed diet, goals of treatment, routine check ups.  - Ambulatory referral to Podiatry - losartan (COZAAR) 25 MG tablet; Take 1 tablet (25 mg total) by mouth daily.  Dispense: 90 tablet; Refill: 0  2. Need for pneumococcal vaccination - Pneumococcal polysaccharide vaccine 23-valent greater than or equal to 2yo subcutaneous/IM  3. Onychomycosis of toenail - Ambulatory referral to Podiatry  4. Chronic hip pain, left Will let us know if she wants ortho referral  5. Hypotension due to drugs Cut losartan in half; 86m losartan  Face-to-face time with patient was more than 25 minutes, >50% time spent counseling and coordination of care

## 2018-12-06 NOTE — Patient Instructions (Addendum)
-   Please start taking half tablet of losartan 50mg  for a total of 25mg  of losartan daily. Next refill should be the 25mg  losartan dose so you can taken one whole tablet.  - Please let us know if left hip pain is worsening and we can refer you to orthopedics.  - You should receive a phone call within the next month about your podiatry referral- if you do not, please let us know.  - You can check your fasting blood sugars 1-2 times a week instead of daily. Our goal for fasting blood sugars is 100-140; you can occasionally check sugar 1-2 hours after a meal. Our goal for 1-2 hours after a meal is under 170.   Foods and drinks to limit include  . fried foods and other foods high in saturated fat and trans fat  . foods high in salt, also called sodium  . sweets, such as baked goods, candy, and ice cream  . beverages with added sugars, such as juice, regular soda, and regular sports or energy drinks  Drink water instead of sweetened beverages. Consider using a sugar substitute in your coffee or tea.   Instead, eat carbohydrates from fruit, vegetables, whole grains, beans, and low-fat or nonfat milk. Choose healthy carbohydrates, such as fruit, vegetables, whole grains, beans, and low-fat milk, as part of your diabetes meal plan.

## 2018-12-11 ENCOUNTER — Other Ambulatory Visit: Payer: Self-pay | Admitting: Family Medicine

## 2018-12-11 DIAGNOSIS — IMO0002 Reserved for concepts with insufficient information to code with codable children: Secondary | ICD-10-CM

## 2018-12-11 DIAGNOSIS — E1142 Type 2 diabetes mellitus with diabetic polyneuropathy: Secondary | ICD-10-CM

## 2018-12-13 ENCOUNTER — Telehealth: Payer: Self-pay

## 2018-12-13 ENCOUNTER — Telehealth: Payer: Self-pay | Admitting: *Deleted

## 2018-12-13 ENCOUNTER — Ambulatory Visit: Payer: Self-pay | Admitting: *Deleted

## 2018-12-13 NOTE — Chronic Care Management (AMB) (Signed)
   Chronic Care Management   Unsuccessful Call Note 12/13/2018 Name: Charlotte Herrera MRN: [id number] DOB: February 07, 1946  Charlotte Herrera is a 73 year old female who sees Suezanne Cheshire for primary care. This social worker was asked  to consult the patient for mental health counseling and community resources.     This social worker was unable to reach patient via telephone today for follow up call. I have left HIPAA compliant voicemail asking patient to return my call. (unsuccessful outreach #1).   Plan: Will follow-up within 7 business days via telephone.     Elliot Gurney, [place] Administrator[facility] Care Management [phone]

## 2018-12-19 ENCOUNTER — Ambulatory Visit: Payer: Self-pay

## 2018-12-19 ENCOUNTER — Telehealth: Payer: Self-pay

## 2018-12-19 ENCOUNTER — Other Ambulatory Visit: Payer: Self-pay | Admitting: Family Medicine

## 2018-12-19 DIAGNOSIS — M1A9XX0 Chronic gout, unspecified, without tophus (tophi): Secondary | ICD-10-CM

## 2018-12-19 DIAGNOSIS — M1990 Unspecified osteoarthritis, unspecified site: Secondary | ICD-10-CM

## 2018-12-19 DIAGNOSIS — E1142 Type 2 diabetes mellitus with diabetic polyneuropathy: Secondary | ICD-10-CM

## 2018-12-19 DIAGNOSIS — E038 Other specified hypothyroidism: Secondary | ICD-10-CM

## 2018-12-19 DIAGNOSIS — IMO0002 Reserved for concepts with insufficient information to code with codable children: Secondary | ICD-10-CM

## 2018-12-19 NOTE — Chronic Care Management (AMB) (Signed)
   Chronic Care Management   Unsuccessful Call Note 12/19/2018 Name: Charlotte Herrera MRN: [id number] DOB: May 14, 1946  Charlotte Herrera a 73year old femalewho seesDr.Melinda Ladafor primary care. Dr. Dirk Dress the CCM team to consult the patient formedication affordability and chronic disease management. 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 Referral was placed2/10/2018. Patient's last office visit was2/10/2018. CCM Team met with Charlotte Herrera face to face to establish health goals2/13/2020and has been following Charlotte Herrera since.   Was unable to reach patient via telephone today for monthly follow up on DM and arthritic pain. I have left HIPAA compliant voicemail asking patient to return my call. (unsuccessful outreach #1).   Plan: Will follow-up within 7 business days via telephone.      Zillah Alexie E. Rollene Rotunda, RN, BSN Nurse Care Coordinator [facility] / [facility] Care Management  [phone]

## 2018-12-20 ENCOUNTER — Telehealth: Payer: Self-pay | Admitting: *Deleted

## 2018-12-21 ENCOUNTER — Other Ambulatory Visit: Payer: Self-pay

## 2018-12-21 ENCOUNTER — Ambulatory Visit: Payer: Self-pay

## 2018-12-21 ENCOUNTER — Telehealth: Payer: Self-pay

## 2018-12-21 DIAGNOSIS — E1142 Type 2 diabetes mellitus with diabetic polyneuropathy: Secondary | ICD-10-CM

## 2018-12-21 DIAGNOSIS — M1990 Unspecified osteoarthritis, unspecified site: Secondary | ICD-10-CM

## 2018-12-21 DIAGNOSIS — M25552 Pain in left hip: Secondary | ICD-10-CM

## 2018-12-21 DIAGNOSIS — G8929 Other chronic pain: Secondary | ICD-10-CM

## 2018-12-21 DIAGNOSIS — IMO0002 Reserved for concepts with insufficient information to code with codable children: Secondary | ICD-10-CM

## 2018-12-21 NOTE — Patient Instructions (Signed)
Thank you allowing the Chronic Care Management Team to be a part of your care! It was a pleasure speaking with you today!  1. Continue to use voltaren Gel as prescribed 2. You may find your hip pain is worse the more inactive you become. Try to move as much as possible 3. You may continue to use ice or heat for comfort 4. Continue to check your blood sugars, take all your medications as prescribed, and eat as you have been. I am so proud of how well you are doing to manage your sugars. 5. I will discuss orthopedic and podiary referral with Suezanne Cheshire as you requested and get back to you.  CCM (Chronic Care Management) Team   Trish Fountain RN, BSN Nurse Care Coordinator  [phone]  [name] PharmD  Clinical Pharmacist  [phone]   Elliot Gurney, LCSW Clinical Social Worker [phone]  Goals Addressed            This Visit's Progress   . I hurt so bad in my knees and hips I can't walk to the mailbox" (pt-stated)       Current Barriers:  . Film/video editor.   . Family Support  Nurse Case Manager Clinical Goal(s):   Over the next 14 days, patient will report improved pain in knees and hips-goal met 11/25/2018  Over the next 7 days, patient will report picking up Voltaren Gel from pharmacy-goal met 11/25/2018  Over the next 30 days, patient will report being able to walk to the mailbox with greater ease  Over the next 14 days, patient will discuss Hip pain with provider at next routien visit 12/06/2018- goal met 12/06/2018  Interventions:  . Assessed for ongoing hip and knee patient and response to Voltaren Gel . Collaborated with PCP for discussion re: Ortho referral as patient states she now wishes to see orthopedist as pain is getting worse . Discussed chronic patient effects on quality of life . Assessed for alternative therapy use including heat and cold application  Patient Self Care Activities:  . Self administers medications as  prescribed . Attends all scheduled provider appointments . Calls pharmacy for medication refills . Performs ADL's independently . Calls provider office for new concerns or questions  Please see past updates related to this goal by clicking on the "Past Updates" button in the selected goal      . I would like to manage my diabetes without adding another medicine (pt-stated)       Current Barriers:  [name] Kitchen Knowledge Deficits related to basic Diabetes pathophysiology and self care/management . Financial strain . Arthritic pain in knees and hips  Nurse Case Manager Clinical Goal(s):  [name] Kitchen Over the next 30 days, patient will continue to demonstrate improved adherence to prescribed treatment plan for diabetes self care/management as evidenced by checking blood glucose levels as prescribed, adhering to ADA/low carb diet, daily exercise, and medication adherence  Interventions:   Assessed barriers to exercise  Assessed compliance with ADA diet and medication adherence  Assessed for receipt of EBT card  Provided emotional support and reassurance   Reviewed recent blood sugar readings  Patient Self Care Activities:  . Self administers medications as prescribed . Attends all scheduled provider appointments . Checks blood sugars as prescribed and utilize hyper and hypoglycemia protocol as needed . Adhere to ADA diet as discussed  Plan:  . Patient will continue to adhere to DM plan of care    Please see past updates related to this  goal by clicking on the "Past Updates" button in the selected goal         The patient verbalized understanding of instructions provided today and declined a print copy of patient instruction materials.   Telephone follow up appointment with care management team member scheduled for: 2 weeks  SYMPTOMS OF A STROKE   You have any symptoms of stroke. "BE FAST" is an easy way to remember the main warning signs: ? B - Balance. Signs are dizziness, sudden  trouble walking, or loss of balance. ? E - Eyes. Signs are trouble seeing or a sudden change in how you see. ? F - Face. Signs are sudden weakness or loss of feeling of the face, or the face or eyelid drooping on one side. ? A - Arms. Signs are weakness or loss of feeling in an arm. This happens suddenly and usually on one side of the body. ? S - Speech. Signs are sudden trouble speaking, slurred speech, or trouble understanding what people say. ? T - Time. Time to call emergency services. Write down what time symptoms started.  You have other signs of stroke, such as: ? A sudden, very bad headache with no known cause. ? Feeling sick to your stomach (nausea). ? Throwing up (vomiting). ? Jerky movements you cannot control (seizure).  SYMPTOMS OF A HEART ATTACK  What are the signs or symptoms? Symptoms of this condition include:  Chest pain. It may feel like: ? Crushing or squeezing. ? Tightness, pressure, fullness, or heaviness.  Pain in the arm, neck, jaw, back, or upper body.  Shortness of breath.  Heartburn.  Indigestion.  Nausea.  Cold sweats.  Feeling tired.  Sudden lightheadedness.

## 2018-12-21 NOTE — Chronic Care Management (AMB) (Signed)
Chronic Care Management   Follow Up Note   12/21/2018 Name: Marijah Larranaga MRN: [id number] DOB: 10/15/1945  Referred by: Arnetha Courser, MD Reason for referral : Chronic Care Management (follow up)   Tandrea Kommer is a 73 y.o. year old female who is a primary care patient of Lada, Satira Anis, MD. The CCM team was consulted for assistance with chronic disease management and care coordination needs.    Review of patient status, including review of consultants reports, relevant laboratory and other test results, and collaboration with appropriate care team members and the patient's provider was performed as part of comprehensive patient evaluation and provision of chronic care management services.    Goals Addressed            This Visit's Progress   . I hurt so bad in my knees and hips I can't walk to the mailbox" (pt-stated)       Ms. Dettmann continue to struggle with hip pain. She saw her provider earlier this month and discussed as instructed. Ms. Cando states at the time of appointment, she was having a less painful day so she decided to defer the referral at this time. Her pain has progressively gotten worse and she would like for CCM RN CM to facilitate referral if possible.   Current Barriers:  . Film/video editor.   . Family Support  Nurse Case Manager Clinical Goal(s):   Over the next 14 days, patient will report improved pain in knees and hips-goal met 11/25/2018  Over the next 7 days, patient will report picking up Voltaren Gel from pharmacy-goal met 11/25/2018  Over the next 30 days, patient will report being able to walk to the mailbox with greater ease  Over the next 14 days, patient will discuss Hip pain with provider at next routien visit 12/06/2018- goal met 12/06/2018  Interventions:  . Assessed for ongoing hip and knee patient and response to Voltaren Gel . Collaborated with PCP for discussion re: Ortho referral as patient states she now wishes to see orthopedist as pain is  getting worse . Discussed chronic patient effects on quality of life . Assessed for alternative therapy use including heat and cold application  Patient Self Care Activities:  . Self administers medications as prescribed . Attends all scheduled provider appointments . Calls pharmacy for medication refills . Performs ADL's independently . Calls provider office for new concerns or questions  Please see past updates related to this goal by clicking on the "Past Updates" button in the selected goal      . I would like to manage my diabetes without adding another medicine (pt-stated)       Ms. Feliz is doing very well managing her diabetes through diet. She is unable to exercise at this time related to ongoing chronic hip and knee pain. She reports limiting her carbohydrates, not eating after 6 pm, and drinking plenty of water. She reports fasting blood sugars in the low 100s. Today she request a referral to orthopedics for possible diagnosis of hip pain and intervention. She understands pain control is the only way she is going to be able to be active. She has received her EBT card and her daughter took her shopping for groceries this week. She was able to purchase fresh/frozen vegetables and "healthier food".She states she will only receive 16.00 per month but when she activated her card it had >100.00 on it. I suspect it is an extra benefit related to COVID-19.   Current Barriers:  .  Knowledge Deficits related to basic Diabetes pathophysiology and self care/management . Financial strain . Arthritic pain in knees and hips  Nurse Case Manager Clinical Goal(s):  [name] Kitchen Over the next 30 days, patient will continue to demonstrate improved adherence to prescribed treatment plan for diabetes self care/management as evidenced by checking blood glucose levels as prescribed, adhering to ADA/low carb diet, daily exercise, and medication adherence  Interventions:   Assessed barriers to exercise  Assessed  compliance with ADA diet and medication adherence  Assessed for receipt of EBT card  Provided emotional support and reassurance   Reviewed recent blood sugar readings  Patient Self Care Activities:  . Self administers medications as prescribed . Attends all scheduled provider appointments . Checks blood sugars as prescribed and utilize hyper and hypoglycemia protocol as needed . Adhere to ADA diet as discussed  Plan:  . Patient will continue to adhere to DM plan of care    Please see past updates related to this goal by clicking on the "Past Updates" button in the selected goal          Telephone follow up appointment with care management team member scheduled for: 2 weeks     Tiea Manninen E. Rollene Rotunda, RN, BSN Nurse Care Coordinator [facility] Care Management  [phone]

## 2018-12-22 ENCOUNTER — Other Ambulatory Visit: Payer: Self-pay | Admitting: Nurse Practitioner

## 2018-12-22 ENCOUNTER — Encounter: Payer: Self-pay | Admitting: *Deleted

## 2018-12-22 DIAGNOSIS — G8929 Other chronic pain: Secondary | ICD-10-CM

## 2018-12-22 DIAGNOSIS — M25552 Pain in left hip: Secondary | ICD-10-CM

## 2018-12-23 ENCOUNTER — Ambulatory Visit: Payer: Self-pay | Admitting: *Deleted

## 2018-12-23 DIAGNOSIS — Z599 Problem related to housing and economic circumstances, unspecified: Secondary | ICD-10-CM

## 2018-12-23 DIAGNOSIS — F331 Major depressive disorder, recurrent, moderate: Secondary | ICD-10-CM

## 2018-12-23 DIAGNOSIS — Z598 Other problems related to housing and economic circumstances: Secondary | ICD-10-CM

## 2018-12-23 NOTE — Patient Instructions (Signed)
Thank you allowing the Chronic Care Management Team to be a part of your care! It was a pleasure speaking with you today!  1. Please call this social worker of there are any questions regarding your community resource needs. 2. Please continue to utilize self care activities   CCM (Chronic Care Management) Team   Trish Fountain RN, BSN Nurse Care Coordinator  [phone]  [name] PharmD  Clinical Pharmacist  [phone]   Elliot Gurney, LCSW Clinical Social Worker [phone]  Goals Addressed            This Visit's Progress   . I am very depressed over the death of my daughter (pt-stated)       Current Barriers:  . Financial constraints . Mental Health Concerns  . Lacks knowledge of community resource: food banks and affordable housing  Clinical Social Work Clinical Goal(s):  [name] Kitchen Over the next 30 days, client will work with SW to address concerns related to depressed mood and community resources  Interventions: . Patient interviewed and appropriate assessments performed . Explored patient's coping in regards to the loss of her daughter and self care activities utilized . Confirmed with patient that she has been approved for food stamps and unemployment benefits due to being laid off due to [place] . Discussed plans with patient for ongoing care management follow up and provided patient with direct contact information for care management team   Patient Self Care Activities:  . Self administers medications as prescribed . Attends all scheduled provider appointments . Performs ADL's independently . Performs IADL's independently . Calls provider office for new concerns or questions  Please see past updates related to this goal by clicking on the "Past Updates" button in the selected goal          The patient verbalized understanding of instructions provided today and declined a print copy of patient instruction materials.   The care management team will  reach out to the patient again over the next 30 days.

## 2018-12-23 NOTE — Chronic Care Management (AMB) (Signed)
  Chronic Care Management    Clinical Social Work Follow Up Note  12/23/2018 Name: Charlotte Herrera MRN: [id number] DOB: [birth year]  Charlotte Herrera is a 73 y.o. year old female who is a primary care patient of Lada, Satira Anis, MD. The CCM team was consulted for assistance with Mental Health Counseling and Resources.   Review of patient status, including review of consultants reports, other relevant assessments, and collaboration with appropriate care team members and the patient's provider was performed as part of comprehensive patient evaluation and provision of chronic care management services.     Goals Addressed            This Visit's Progress   . I am very depressed over the death of my daughter (pt-stated)       Current Barriers:  . Financial constraints . Mental Health Concerns  . Lacks knowledge of community resource: food banks and affordable housing  Clinical Social Work Clinical Goal(s):  [name] Kitchen Over the next 30 days, client will work with SW to address concerns related to depressed mood and community resources  Interventions: . Patient interviewed and appropriate assessments performed . Explored patient's coping in regards to the loss of her daughter and self care activities utilized . Confirmed with patient that she has been approved for food stamps and unemployment benefits due to being laid off due to [place] . Discussed plans with patient for ongoing care management follow up and provided patient with direct contact information for care management team   Patient Self Care Activities:  . Self administers medications as prescribed . Attends all scheduled provider appointments . Performs ADL's independently . Performs IADL's independently . Calls provider office for new concerns or questions  Please see past updates related to this goal by clicking on the "Past Updates" button in the selected goal          Follow Up Plan: SW will follow up with patient by phone over the next 30  days   [place], [place] Worker  [facility] Care Management [phone]

## 2018-12-23 NOTE — Chronic Care Management (AMB) (Deleted)
  Chronic Care Management    Clinical Social Work Follow Up Note  12/23/2018 Name: Charlotte Herrera MRN: [id number] DOB: 1946-01-23  Charlotte Herrera is a 73 y.o. year old female who is a primary care patient of Lada, Satira Anis, MD. The CCM team was consulted for assistance with {CCM SW CONSULT REASONS:22330}.   Review of patient status, including review of consultants reports, other relevant assessments, and collaboration with appropriate care team members and the patient's provider was performed as part of comprehensive patient evaluation and provision of chronic care management services.     Goals Addressed            This Visit's Progress   . I am very depressed over the death of my daughter (pt-stated)       Current Barriers:  . Financial constraints . Mental Health Concerns  . Lacks knowledge of community resource: food banks and affordable housing  Clinical Social Work Clinical Goal(s):  [name] Kitchen Over the next 30 days, client will work with SW to address concerns related to depressed mood and community resources  Interventions: . Patient interviewed and appropriate assessments performed . Explored patient's coping in regards to the loss of her daughter and self care activities utilized . Confirmed with patient that she has been approved for food stamps and unemployment benefits . Discussed plans with patient for ongoing care management follow up and provided patient with direct contact information for care management team   Patient Self Care Activities:  . Self administers medications as prescribed . Attends all scheduled provider appointments . Performs ADL's independently . Performs IADL's independently . Calls provider office for new concerns or questions  Please see past updates related to this goal by clicking on the "Past Updates" button in the selected goal          Follow Up Plan: {CCM SW FOLLOW UP RKVT:55217}   SIGNATURE

## 2018-12-23 NOTE — Progress Notes (Signed)
This encounter was created in error - please disregard.

## 2018-12-24 ENCOUNTER — Other Ambulatory Visit: Payer: Self-pay | Admitting: Family Medicine

## 2018-12-29 ENCOUNTER — Ambulatory Visit: Payer: PPO | Admitting: Podiatry

## 2018-12-29 ENCOUNTER — Encounter: Payer: Self-pay | Admitting: Podiatry

## 2018-12-29 ENCOUNTER — Other Ambulatory Visit: Payer: Self-pay

## 2018-12-29 DIAGNOSIS — E1140 Type 2 diabetes mellitus with diabetic neuropathy, unspecified: Secondary | ICD-10-CM | POA: Insufficient documentation

## 2018-12-29 DIAGNOSIS — M79675 Pain in left toe(s): Secondary | ICD-10-CM | POA: Diagnosis not present

## 2018-12-29 DIAGNOSIS — B351 Tinea unguium: Secondary | ICD-10-CM | POA: Diagnosis not present

## 2018-12-29 DIAGNOSIS — M79674 Pain in right toe(s): Secondary | ICD-10-CM

## 2018-12-29 DIAGNOSIS — E1142 Type 2 diabetes mellitus with diabetic polyneuropathy: Secondary | ICD-10-CM | POA: Diagnosis not present

## 2018-12-29 DIAGNOSIS — M2041 Other hammer toe(s) (acquired), right foot: Secondary | ICD-10-CM

## 2018-12-29 DIAGNOSIS — M129 Arthropathy, unspecified: Secondary | ICD-10-CM

## 2018-12-29 NOTE — Progress Notes (Signed)
Complaint:  Visit Type: Patient returns to my office for continued preventative foot care services. Complaint: Patient states" my nails third and fifth toenails right foot  have grown Salmi and thick and become painful to walk and wear shoes" Patient has been diagnosed with DM with neuropathy. The patient presents for preventative foot care services. No changes to ROS.  Patient also has hammer toe second right.  Podiatric Exam: Vascular: dorsalis pedis and posterior tibial pulses are palpable bilateral. Capillary return is immediate. Temperature gradient is WNL. Skin turgor WNL  Sensorium: Absent  Semmes Weinstein monofilament test. Absent  tactile sensation bilaterally. Nail Exam: Pt has thick disfigured discolored nails third and fifth toenail right foot. Ulcer Exam: There is no evidence of ulcer or pre-ulcerative changes or infection. Orthopedic Exam: Muscle tone and strength are WNL. No limitations in general ROM. No crepitus or effusions noted. Foot type and digits show no abnormalities. 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  Hammer toe second right foot. Skin: No Porokeratosis. No infection or ulcers  Diagnosis:  Onychomycosis, , Pain in right toe,   Treatment & Plan Procedures and Treatment: Consent by patient was obtained for treatment procedures.   Debridement of mycotic and hypertrophic toenails 3,5 right foot. Return Visit-Office Procedure: Patient instructed to return to the office for a follow up visit 3 months for continued evaluation and treatment. Told patient to make an appointment with the surgeons if she desires correction of hammer toe.    Gardiner Barefoot DPM

## 2019-01-12 ENCOUNTER — Ambulatory Visit: Payer: Self-pay | Admitting: *Deleted

## 2019-01-12 ENCOUNTER — Telehealth: Payer: Self-pay

## 2019-01-12 DIAGNOSIS — F331 Major depressive disorder, recurrent, moderate: Secondary | ICD-10-CM

## 2019-01-12 DIAGNOSIS — Z599 Problem related to housing and economic circumstances, unspecified: Secondary | ICD-10-CM

## 2019-01-12 DIAGNOSIS — Z598 Other problems related to housing and economic circumstances: Secondary | ICD-10-CM

## 2019-01-12 NOTE — Chronic Care Management (AMB) (Signed)
   Chronic Care Management   Unsuccessful Call Note 01/12/2019 Name: Charlotte Herrera MRN: [id number] DOB: 15-Jan-1946  Patient is a 73 year old female who sees Suezanne Cheshire, NP, for primary care. Suezanne Cheshire, NP asked the CCM team to consult the patient for Mental Health Counseling and Resources.      This social worker was unable to reach patient via telephone today for follow up call. I have left HIPAA compliant voicemail asking patient to return my call. (unsuccessful outreach #1).   Plan: Will follow-up within 7 business days via telephone.     Elliot Gurney, [place] Administrator, [facility] Care Management [phone]

## 2019-01-19 ENCOUNTER — Ambulatory Visit: Payer: Self-pay | Admitting: *Deleted

## 2019-01-19 ENCOUNTER — Telehealth: Payer: Self-pay | Admitting: *Deleted

## 2019-01-19 NOTE — Chronic Care Management (AMB) (Signed)
   Chronic Care Management   Unsuccessful Call Note 01/19/2019 Name: Charlotte Herrera MRN: [id number] DOB: 1945-12-22  Patient  is a 73 year old female who sees Suezanne Cheshire, NP for primary care. This  CCM social worker was asked to consult the patient for Mental Health Counseling and Resources.     This social worker was unable to reach patient via telephone today. I have left HIPAA compliant voicemail asking patient to return my call. (unsuccessful outreach #2).   Plan: Will follow-up within 7 business days via telephone.     Elliot Gurney, [place] Administrator, [facility] Care Management [phone]

## 2019-01-20 DIAGNOSIS — M65341 Trigger finger, right ring finger: Secondary | ICD-10-CM | POA: Diagnosis not present

## 2019-01-21 ENCOUNTER — Other Ambulatory Visit: Payer: Self-pay | Admitting: Nurse Practitioner

## 2019-01-26 ENCOUNTER — Telehealth: Payer: Self-pay | Admitting: *Deleted

## 2019-01-30 ENCOUNTER — Telehealth: Payer: Self-pay | Admitting: *Deleted

## 2019-01-30 ENCOUNTER — Ambulatory Visit: Payer: Self-pay | Admitting: *Deleted

## 2019-01-30 DIAGNOSIS — Z599 Problem related to housing and economic circumstances, unspecified: Secondary | ICD-10-CM

## 2019-01-30 DIAGNOSIS — F331 Major depressive disorder, recurrent, moderate: Secondary | ICD-10-CM

## 2019-01-30 DIAGNOSIS — Z598 Other problems related to housing and economic circumstances: Secondary | ICD-10-CM

## 2019-01-30 NOTE — Chronic Care Management (AMB) (Signed)
   Chronic Care Management   Unsuccessful Call Note 01/30/2019 Name: Charlotte Herrera MRN: [id number] DOB: 27-Nov-1945  Patient is a 73 year old female who sees Suezanne Cheshire, NP for primary care. Suezanne Cheshire, NP asked the CCM team to consult the patient for Mental Health Counseling and Resources.     Was unable to reach patient via telephone today for follow up appointment to assess for continued social work involvement. I have left HIPAA compliant voicemail asking patient to return my call. (unsuccessful outreach #3).   Plan: This Education officer, museum will cease all future attempts to reach patient at 3 attempts have been made to reach patient with no return call. This social worker will be happy to engage patient upon her return call.    Elliot Gurney, [place] Administrator, [facility] Care Management [phone]

## 2019-02-07 ENCOUNTER — Other Ambulatory Visit: Payer: Self-pay | Admitting: Family Medicine

## 2019-02-08 ENCOUNTER — Ambulatory Visit (INDEPENDENT_AMBULATORY_CARE_PROVIDER_SITE_OTHER): Payer: PPO | Admitting: Nurse Practitioner

## 2019-02-08 ENCOUNTER — Other Ambulatory Visit: Payer: Self-pay

## 2019-02-08 ENCOUNTER — Encounter: Payer: Self-pay | Admitting: Nurse Practitioner

## 2019-02-08 VITALS — BP 122/60 | HR 78 | Temp 96.9°F | Resp 14 | Ht 63.0 in | Wt 171.2 lb

## 2019-02-08 DIAGNOSIS — F411 Generalized anxiety disorder: Secondary | ICD-10-CM | POA: Diagnosis not present

## 2019-02-08 DIAGNOSIS — I129 Hypertensive chronic kidney disease with stage 1 through stage 4 chronic kidney disease, or unspecified chronic kidney disease: Secondary | ICD-10-CM

## 2019-02-08 DIAGNOSIS — K7469 Other cirrhosis of liver: Secondary | ICD-10-CM | POA: Diagnosis not present

## 2019-02-08 DIAGNOSIS — E1165 Type 2 diabetes mellitus with hyperglycemia: Secondary | ICD-10-CM | POA: Diagnosis not present

## 2019-02-08 DIAGNOSIS — E038 Other specified hypothyroidism: Secondary | ICD-10-CM | POA: Diagnosis not present

## 2019-02-08 DIAGNOSIS — F419 Anxiety disorder, unspecified: Secondary | ICD-10-CM | POA: Diagnosis not present

## 2019-02-08 DIAGNOSIS — F331 Major depressive disorder, recurrent, moderate: Secondary | ICD-10-CM | POA: Diagnosis not present

## 2019-02-08 DIAGNOSIS — I700 Atherosclerosis of aorta: Secondary | ICD-10-CM | POA: Diagnosis not present

## 2019-02-08 DIAGNOSIS — E1142 Type 2 diabetes mellitus with diabetic polyneuropathy: Secondary | ICD-10-CM

## 2019-02-08 DIAGNOSIS — E1122 Type 2 diabetes mellitus with diabetic chronic kidney disease: Secondary | ICD-10-CM | POA: Diagnosis not present

## 2019-02-08 DIAGNOSIS — IMO0002 Reserved for concepts with insufficient information to code with codable children: Secondary | ICD-10-CM

## 2019-02-08 MED ORDER — HYDROXYZINE PAMOATE 25 MG PO CAPS: 25.0000 mg | ORAL_CAPSULE | Freq: Every day | ORAL | 0 refills | Status: AC | PRN

## 2019-02-08 MED ORDER — XULTOPHY 100-3.6 UNIT-MG/ML ~~LOC~~ SOPN: 30.0000 [IU] | PEN_INJECTOR | Freq: Every day | SUBCUTANEOUS | 0 refills | Status: DC

## 2019-02-08 MED ORDER — DEXCOM G6 SENSOR MISC: 1.0000 | 3 refills | Status: AC

## 2019-02-08 MED ORDER — DEXCOM G5 RECEIVER KIT DEVI: 1.0000 | Freq: Once | 0 refills | Status: AC

## 2019-02-08 NOTE — Progress Notes (Signed)
Name: Charlotte Herrera   MRN: [id number]    DOB: 1945/12/17   Date:02/08/2019       Progress Note  Subjective  Chief Complaint  Chief Complaint  Patient presents with  . Follow-up  . Diabetes    wants to change lantus back to xultophy    HPI  Diabetes Mellitus Patient is rx metformin 525m BID; lantus 42 units at bedtime. Takes medications as prescribed with no missed doses a month. States she cut down her metformin to once daily due to having an episode of hypoglycemia last week. She got jittery and and sweaty and sugar was under 100- around 98.  Diet: Checks blood sugars occasionally- fasting sugars are between 112-145 Denies polyphagia, polydipsia, polyuria.  Patient has peripheral neuropathy- taking gabapentin 3041m She is on ARB and statin.  Lab Results  Component Value Date   HGBA1C 8.3 10/31/2018   Hypertension Patient is on losratan 2536maily.  Takes medications as prescribed with no missed doses a month.  She is relatively compliant with low-salt diet.  Denies chest pain, headaches, blurry vision. BP Readings from Last 3 Encounters:  02/08/19 122/60  12/06/18 (!) 102/58  07/07/18 128/68    Hyperlipidemia Patient rx pravastatin 11m71mily. Takes medications as prescribed with no missed doses a month.  Diet: increasing protein in her diet, has been cooking more during pandemic, trying to eat before 6pm.  Denies myalgias She has calcifications of abdominal aorta and carotid stenosis Lab Results  Component Value Date   CHOL 154 04/26/2018   HDL 42 04/26/2018   LDLCALC 70 04/26/2018   TRIG 211 (H) 04/26/2018   CHOLHDL 3.7 04/26/2018    Cirrhosis  Listed in history, had followed up with Dr. WohlAllen Norrisst seen over a year ago; last LFTs were normal.   Hypothyroidism Patient rx levothryoxine 50 mcg daily. Has been on this dose for a few years. Patient denies fatigue/palpitations, insomnia, constipation/diarrhea, hot/cold intolerances,   Lab Results  Component Value  Date   TSH 1.04 08/03/2017     PHQ2/9: Depression screen PHQ [facility] 02/08/2019 12/06/2018 11/03/2018 11/01/2018 07/07/2018  Decreased Interest 1 0 0 0 0  Down, Depressed, Hopeless 1 0 0 2 1  PHQ - 2 Score 2 0 0 2 1  Altered sleeping 3 0 - 2 3  Tired, decreased energy 3 0 - 2 3  Change in appetite 3 0 - 0 1  Feeling bad or failure about yourself  1 0 - 0 0  Trouble concentrating 1 0 - 0 0  Moving slowly or fidgety/restless 1 0 - 0 0  Suicidal thoughts 0 0 - 0 0  PHQ-9 Score 14 0 - 6 8  Difficult doing work/chores Somewhat difficult Not difficult at all - Not difficult at all Somewhat difficult  Some recent data might be hidden    PHQ reviewed. Positive; not taking wellbutrin, has tried SSRI and SNRI before, does not want any of these medications, would like xanax, states this time of year she has increase depression as her daughter who passes aways birthday is next month. She does not want counseling or psychiatry referral at this time.   Patient Active Problem List   Diagnosis Date Noted  . Diabetic neuropathy (HCC)[place]/07/2018  . Hammer toe of second toe of right foot 12/29/2018  . Chronic arthropathy 12/29/2018  . Gastritis without bleeding   . Carotid stenosis 01/04/2018  . 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  Anxiety, generalized 09/03/2017  . Depression 09/03/2017  . Calcification of abdominal aorta (HCC) 09/03/2017  . Cirrhosis ([place]) 09/03/2017  . Hx of transient ischemic attack (TIA) 07/06/2017  . H/O carotid endarterectomy 05/28/2017  . Plantar fasciitis 11/27/2016  . Transaminitis 03/11/2016  . Environmental and seasonal allergies 12/05/2015  . Lipoma of abdominal wall 12/05/2015  . Osteoarthritis of knee 08/05/2015  . Chronic gout involving toe of right foot without tophus 05/28/2015  . Hypothyroidism 05/14/2015  . Impingement syndrome of shoulder region 01/17/2015  . Vitamin B12 deficiency 12/25/2014  . Vitamin D  deficiency 12/25/2014  . Uncontrolled type 2 diabetes mellitus with peripheral neuropathy ([place]) 12/25/2014  . Hx of transient ischemic attack (TIA) 12/25/2014  . Abnormal liver enzymes 12/25/2014    Past Medical History:  Diagnosis Date  . Allergy   . Arthritis    hands, shoulders, knees, feet  . Calcification of abdominal aorta (HCC) 09/03/2017   CT scan [facility] Feb 2019  . Cirrhosis ([place]) 09/03/2017   Chest CT [facility] Feb 2019  . Depression   . Diabetes mellitus, type 2 ([place])   . Enlarged liver   . Family history of adverse reaction to anesthesia    Mother - PONV  . Fibromyalgia   . Fibromyalgia affecting multiple sites    pinched nerve in back of neck  . GERD (gastroesophageal reflux disease)   . Headache   . Hyperlipidemia   . Hypertension   . Hypothyroidism   . Motion sickness   . PONV (postoperative nausea and vomiting)   . Stroke ([place])   . Thyroid disease   . TIA (transient ischemic attack)   . Vertigo    after cataract surgery    Past Surgical History:  Procedure Laterality Date  . ABDOMINAL HYSTERECTOMY    . CARPAL TUNNEL RELEASE Left   . COLONOSCOPY  2015  . COLONOSCOPY WITH PROPOFOL N/A 11/05/2017   Procedure: COLONOSCOPY WITH PROPOFOL;  Surgeon: Lucilla Lame, MD;  Location: [place];  Service: Endoscopy;  Laterality: N/A;  Diabetic  . COLONOSCOPY WITH PROPOFOL N/A 02/25/2018   Procedure: COLONOSCOPY WITH PROPOFOL;  Surgeon: Lucilla Lame, MD;  Location: [place];  Service: Endoscopy;  Laterality: N/A;  . ESOPHAGOGASTRODUODENOSCOPY (EGD) WITH PROPOFOL N/A 11/05/2017   Procedure: ESOPHAGOGASTRODUODENOSCOPY (EGD) WITH PROPOFOL;  Surgeon: Lucilla Lame, MD;  Location: [place];  Service: Endoscopy;  Laterality: N/A;  . ESOPHAGOGASTRODUODENOSCOPY (EGD) WITH PROPOFOL N/A 02/25/2018   Procedure: ESOPHAGOGASTRODUODENOSCOPY (EGD) WITH PROPOFOL;  Surgeon: Lucilla Lame, MD;  Location: [place];  Service: Endoscopy;  Laterality: N/A;   diabetic - insulin and oral meds  . SPINE SURGERY    . VARICOSE VEIN SURGERY Left 1975    Social History   Tobacco Use  . Smoking status: Never Smoker  . Smokeless tobacco: Never Used  . Tobacco comment: smoking cessation materials not required  Substance Use Topics  . Alcohol use: No     Current Outpatient Medications:  .  buPROPion (WELLBUTRIN XL) 150 MG 24 hr tablet, Take 150 mg by mouth daily., Disp: , Rfl:  .  fluticasone (FLONASE) 50 MCG/ACT nasal spray, Place 2 sprays into both nostrils daily., Disp: 16 g, Rfl: 11 .  gabapentin (NEURONTIN) 300 MG capsule, TAKE 1 CAPSULE(300 MG) BY MOUTH AT BEDTIME, Disp: 90 capsule, Rfl: 1 .  Insulin Glargine (LANTUS SOLOSTAR) 100 UNIT/ML Solostar Pen, Inject 40 Units into the skin at bedtime. (Patient taking differently: Inject 42 Units into the skin at bedtime. ), Disp:  5 pen, Rfl: 11 .  ketoconazole (NIZORAL) 2 % cream, APPLY TOPICALLY TO THE AFFECTED AREA DAILY AS NEEDED FOR IRRITATION( WHERE NEEDED), Disp: 60 g, Rfl: 2 .  levothyroxine (SYNTHROID) 50 MCG tablet, Take 1 tablet (50 mcg total) by mouth daily before breakfast., Disp: 90 tablet, Rfl: 1 .  losartan (COZAAR) 25 MG tablet, Take 1 tablet (25 mg total) by mouth daily., Disp: 90 tablet, Rfl: 0 .  metFORMIN (GLUCOPHAGE) 500 MG tablet, TAKE 1 TABLET(500 MG) BY MOUTH TWICE DAILY WITH A MEAL, Disp: 60 tablet, Rfl: 2 .  pantoprazole (PROTONIX) 20 MG tablet, TAKE 1 TABLET BY MOUTH DAILY FOR HEARTBURN, REFLUX; PROLONGED USE MAY INCREASE RISK OF PNEUMONIA, COLITIS, OSTEOPOROSIS, ANEMIA, Disp: 90 tablet, Rfl: 0 .  pravastatin (PRAVACHOL) 20 MG tablet, Take 1 tablet (20 mg total) by mouth at bedtime. For cholesterol, Disp: 90 tablet, Rfl: 3 .  Blood Glucose Monitoring Suppl (ONE TOUCH ULTRA MINI) w/Device KIT, 1 Device by Does not apply route 2 (two) times daily., Disp: 1 each, Rfl: 0 .  clopidogrel (PLAVIX) 75 MG tablet, Take 1 tablet (75 mg total) by mouth daily. (Patient not taking: Reported on  02/08/2019), Disp: 90 tablet, Rfl: 3 .  diclofenac sodium (VOLTAREN) 1 % GEL, Apply 2 g topically 2 (two) times daily as needed. (Patient not taking: Reported on 02/08/2019), Disp: 100 g, Rfl: 2 .  glucose blood (ONE TOUCH ULTRA TEST) test strip, TEST twice a day, Disp: 180 each, Rfl: 3 .  Insulin Pen Needle (BD PEN NEEDLE MICRO U/F) 32G X 6 MM MISC, USE AS DIRECTED once AT BEDTIME for insulin administration DX:E11.42 Lon:99 months, Disp: 100 each, Rfl: 3 .  OneTouch Delica Lancets 40J MISC, 33 g by Does not apply route 2 (two) times daily. Test twice a day as directed, Disp: 100 each, Rfl: 2 .  traZODone (DESYREL) 50 MG tablet, TAKE 1/2 TO 1 TABLET(25 TO 50 MG) BY MOUTH AT BEDTIME AS NEEDED FOR SLEEP (Patient not taking: Reported on 02/08/2019), Disp: 30 tablet, Rfl: 0  Allergies  Allergen Reactions  . Aspirin   . Duloxetine Hcl   . Penicillins   . Codeine Nausea And Vomiting and Rash  . Lyrica [Pregabalin] Nausea And Vomiting  . Sulfa Antibiotics Swelling    ROS    No other specific complaints in a complete review of systems (except as listed in HPI above).  Objective  Vitals:   02/08/19 1313  BP: 122/60  Pulse: 78  Resp: 14  Temp: (!) 96.9 F (36.1 C)  SpO2: 96%  Weight: 171 lb 3.2 oz (77.7 kg)  Height: 5' 3"  (1.6 m)    Body mass index is 30.33 kg/m.  Nursing Note and Vital Signs reviewed.  Physical Exam HENT:     Head: Normocephalic and atraumatic.     Right Ear: Tympanic membrane, ear canal and external ear normal.     Left Ear: Tympanic membrane, ear canal and external ear normal.   Constitutional: Patient appears well-developed and well-nourished. Obese No distress.  Cardiovascular: Normal rate, regular rhythm and normal heart sounds.  No murmur heard. No BLE edema. Pulmonary/Chest: Effort normal and breath sounds normal. No respiratory distress. Abdominal: Soft.  There is no tenderness. Psychiatric: Patient has a normal mood and affect. behavior is normal.  Judgment and thought content normal. Tearful when discussing daughter.     No results found for this or any previous visit (from the past 48 hour(s)).  Assessment & Plan  1. Calcification of  abdominal aorta (HCC) LDL goal under 70.  - Lipid Profile  2. 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 Other cirrhosis of liver (HCC) - Comprehensive Metabolic Panel (CMET)  4. Uncontrolled type 2 diabetes mellitus with peripheral neuropathy ([place]) Will try to see if her insurance will cover xultophy and stop the lantus and start at 30 units and titrate up as needed, will call if she gets xultophy. Discussed hypoglycemia prevention.  - Insulin Degludec-Liraglutide (XULTOPHY) 100-3.6 UNIT-MG/ML SOPN; Inject 30 Units into the skin daily.  Dispense: 3 pen; Refill: 0 - HgB A1c - Comprehensive Metabolic Panel (CMET) - Continuous Blood Gluc Receiver (DEXCOM G5 RECEIVER KIT) DEVI; 1 Device by Does not apply route once for 1 dose.  Dispense: 1 Device; Refill: 0 - Continuous Blood Gluc Sensor (DEXCOM G6 SENSOR) MISC; 1 Device by Does not apply route every 14 (fourteen) days.  Dispense: 6 each; Refill: 3  5. Other specified hypothyroidism - TSH  6. Diabetic polyneuropathy associated with type 2 diabetes mellitus (HCC) Continue gabapentin  7. Anxiety - hydrOXYzine (VISTARIL) 25 MG capsule; Take 1 capsule (25 mg total) by mouth daily as needed for anxiety.  Dispense: 30 capsule; Refill: 0  8. Moderate episode of recurrent major depressive disorder (HCC) Declines medications, counseling, or referral at this time.

## 2019-02-08 NOTE — Patient Instructions (Addendum)
- Please increase your metformin to 564m twice a day and decrease lantus to 40 units a day. - If you have another episode of low blood sugars please decrease your lantus by 2 units and continue at the lower dose.  _______ - We will work on switching you to xultophy and set a follow-up with you in one week. Please call [place] you start xultophy; Do NOT take xultophy AND lantus.   ______  Call to reschedule appointment with Dr. DLucky Cowboy vascular, Phone: [phone]   Hypoglycemia Hypoglycemia occurs when the level of sugar (glucose) in the blood is too low. Hypoglycemia can happen in people who do or do not have diabetes. It can develop quickly, and it can be a medical emergency. For most people with diabetes, a blood glucose level below 70 mg/dL (3.9 mmol/L) is considered hypoglycemia. Glucose is a type of sugar that provides the body's main source of energy. Certain hormones (insulin and glucagon) control the level of glucose in the blood. Insulin lowers blood glucose, and glucagon raises blood glucose. Hypoglycemia can result from having too much insulin in the bloodstream, or from not eating enough food that contains glucose. You may also have reactive hypoglycemia, which happens within 4 hours after eating a meal. What are the causes? Hypoglycemia occurs most often in people who have diabetes and may be caused by:  Diabetes medicine.  Not eating enough, or not eating often enough.  Increased physical activity.  Drinking alcohol on an empty stomach. If you do not have diabetes, hypoglycemia may be caused by:  A tumor in the pancreas.  Not eating enough, or not eating for [place] periods at a time (fasting).  A severe infection or illness.  Certain medicines. What increases the risk? Hypoglycemia is more likely to develop in:  People who have diabetes and take medicines to lower blood glucose.  People who abuse alcohol.  People who have a severe illness. What are the signs  or symptoms? Mild symptoms Mild hypoglycemia may not cause any symptoms. If you do have symptoms, they may include:  Hunger.  Anxiety.  Sweating and feeling clammy.  Dizziness or feeling light-headed.  Sleepiness.  Nausea.  Increased heart rate.  Headache.  Blurry vision.  Irritability.  Tingling or numbness around the mouth, lips, or tongue.  A change in coordination.  Restless sleep. Moderate symptoms Moderate hypoglycemia can cause:  Mental confusion and poor judgment.  Behavior changes.  Weakness.  Irregular heartbeat. Severe symptoms Severe hypoglycemia is a medical emergency. It can cause:  Fainting.  Seizures.  Loss of consciousness (coma).  Death. How is this diagnosed? Hypoglycemia is diagnosed with a blood test to measure your blood glucose level. This blood test is done while you are having symptoms. Your health care provider may also do a physical exam and review your medical history. How is this treated? This condition can often be treated by immediately eating or drinking something that contains sugar, such as:  Fruit juice, 4-6 oz (120-150 mL).  Regular soda (not diet soda), 4-6 oz (120-150 mL).  Low-fat milk, 4 oz (120 mL).  Several pieces of hard candy.  Sugar or honey, 1 Tbsp (15 mL). Treating hypoglycemia if you have diabetes If you are alert and able to swallow safely, follow the 15:15 rule:  Take 15 grams of a rapid-acting carbohydrate. Talk with your health care provider about how much you should take.  Rapid-acting options include: ? Glucose pills (take 15 grams). ? 6-8 pieces of  hard candy. ? 4-6 oz (120-150 mL) of fruit juice. ? 4-6 oz (120-150 mL) of regular (not diet) soda. ? 1 Tbsp (15 mL) honey or sugar.  Check your blood glucose 15 minutes after you take the carbohydrate.  If the repeat blood glucose level is still at or below 70 mg/dL (3.9 mmol/L), take 15 grams of a carbohydrate again.  If your blood  glucose level does not increase above 70 mg/dL (3.9 mmol/L) after 3 tries, seek emergency medical care.  After your blood glucose level returns to normal, eat a meal or a snack within 1 hour.  Treating severe hypoglycemia Severe hypoglycemia is when your blood glucose level is at or below 54 mg/dL (3 mmol/L). Severe hypoglycemia is a medical emergency. Get medical help right away. If you have severe hypoglycemia and you cannot eat or drink, you may need an injection of glucagon. A family member or close friend should learn how to check your blood glucose and how to give you a glucagon injection. Ask your health care provider if you need to have an emergency glucagon injection kit available. Severe hypoglycemia may need to be treated in a hospital. The treatment may include getting glucose through an IV. You may also need treatment for the cause of your hypoglycemia. Follow these instructions at home:  General instructions  Take over-the-counter and prescription medicines only as told by your health care provider.  Monitor your blood glucose as told by your health care provider.  Limit alcohol intake to no more than 1 drink a day for nonpregnant women and 2 drinks a day for men. One drink equals 12 oz of beer (355 mL), 5 oz of wine (148 mL), or 1 oz of hard liquor (44 mL).  Keep all follow-up visits as told by your health care provider. This is important. If you have diabetes:  Always have a rapid-acting carbohydrate snack with you to treat low blood glucose.  Follow your diabetes management plan as directed. Make sure you: ? Know the symptoms of hypoglycemia. It is important to treat it right away to prevent it from becoming severe. ? Take your medicines as directed. ? Follow your exercise plan. ? Follow your meal plan. Eat on time, and do not skip meals. ? Check your blood glucose as often as directed. Always check before and after exercise. ? Follow your sick day plan whenever you  cannot eat or drink normally. Make this plan in advance with your health care provider.  Share your diabetes management plan with people in your workplace, school, and household.  Check your urine for ketones when you are ill and as told by your health care provider.  Carry a medical alert card or wear medical alert jewelry. Contact a health care provider if:  You have problems keeping your blood glucose in your target range.  You have frequent episodes of hypoglycemia. Get help right away if:  You continue to have hypoglycemia symptoms after eating or drinking something containing glucose.  Your blood glucose is at or below 54 mg/dL (3 mmol/L).  You have a seizure.  You faint. These symptoms may represent a serious problem that is an emergency. Do not wait to see if the symptoms will go away. Get medical help right away. Call your local emergency services (911 in the [place] Summary  Hypoglycemia occurs when the level of sugar (glucose) in the blood is too low.  Hypoglycemia can happen in people who do or do not have diabetes. It can  develop quickly, and it can be a medical emergency.  Make sure you know the symptoms of hypoglycemia and how to treat it.  Always have a rapid-acting carbohydrate snack with you to treat low blood sugar. This information is not intended to replace advice given to you by your health care provider. Make sure you discuss any questions you have with your health care provider. Document Released: 06/15/2005 Document Revised: 12/06/2017 Document Reviewed: 07/19/2015 Elsevier Patient Education  2020 [employer].

## 2019-02-09 ENCOUNTER — Telehealth: Payer: Self-pay | Admitting: Family Medicine

## 2019-02-09 DIAGNOSIS — I700 Atherosclerosis of aorta: Secondary | ICD-10-CM | POA: Diagnosis not present

## 2019-02-09 DIAGNOSIS — E038 Other specified hypothyroidism: Secondary | ICD-10-CM | POA: Diagnosis not present

## 2019-02-09 DIAGNOSIS — K7469 Other cirrhosis of liver: Secondary | ICD-10-CM | POA: Diagnosis not present

## 2019-02-09 DIAGNOSIS — E1142 Type 2 diabetes mellitus with diabetic polyneuropathy: Secondary | ICD-10-CM | POA: Diagnosis not present

## 2019-02-09 DIAGNOSIS — E1165 Type 2 diabetes mellitus with hyperglycemia: Secondary | ICD-10-CM | POA: Diagnosis not present

## 2019-02-09 NOTE — Telephone Encounter (Signed)
Patient called with concerns she thinks you told her to take 1/2 of the hydroxyzine and it's a capsule.  Also she thought she was told to take 40 units of xultophy but rx states 30?  Please verify?

## 2019-02-09 NOTE — Telephone Encounter (Signed)
Copied from [place] [id number]. Topic: General - Other >> Feb 09, 2019 10:57 AM [name] wrote: Reason for CRM: Patient would like a call from the nurse regarding the dosage change on her medications.  Patient was not aware of the change.  CB# [phone]

## 2019-02-10 ENCOUNTER — Telehealth (INDEPENDENT_AMBULATORY_CARE_PROVIDER_SITE_OTHER): Payer: Self-pay | Admitting: Vascular Surgery

## 2019-02-10 LAB — COMPREHENSIVE METABOLIC PANEL
ALT: 24 IU/L (ref 0–32)
AST: 29 IU/L (ref 0–40)
Albumin/Globulin Ratio: 1.6 (ref 1.2–2.2)
Albumin: 4.5 g/dL (ref 3.7–4.7)
Alkaline Phosphatase: 66 IU/L (ref 39–117)
BUN/Creatinine Ratio: 17 (ref 12–28)
BUN: 18 mg/dL (ref 8–27)
Bilirubin Total: 0.4 mg/dL (ref 0.0–1.2)
CO2: 21 mmol/L (ref 20–29)
Calcium: 9.6 mg/dL (ref 8.7–10.3)
Chloride: 99 mmol/L (ref 96–106)
Creatinine, Ser: 1.04 mg/dL — ABNORMAL HIGH (ref 0.57–1.00)
GFR calc Af Amer: 62 mL/min/{1.73_m2} (ref >59)
GFR calc non Af Amer: 53 mL/min/{1.73_m2} — ABNORMAL LOW (ref >59)
Globulin, Total: 2.9 g/dL (ref 1.5–4.5)
Glucose: 220 mg/dL — ABNORMAL HIGH (ref 65–99)
Potassium: 3.9 mmol/L (ref 3.5–5.2)
Sodium: 138 mmol/L (ref 134–144)
Total Protein: 7.4 g/dL (ref 6.0–8.5)

## 2019-02-10 LAB — LIPID PANEL
Chol/HDL Ratio: 2.7 ratio (ref 0.0–4.4)
Cholesterol, Total: 173 mg/dL (ref 100–199)
HDL: 64 mg/dL (ref >39)
LDL Calculated: 60 mg/dL (ref 0–99)
Triglycerides: 246 mg/dL — ABNORMAL HIGH (ref 0–149)
VLDL Cholesterol Cal: 49 mg/dL — ABNORMAL HIGH (ref 5–40)

## 2019-02-10 LAB — HEMOGLOBIN A1C
Est. average glucose Bld gHb Est-mCnc: 180 mg/dL
Hgb A1c MFr Bld: 7.9 % — ABNORMAL HIGH (ref 4.8–5.6)

## 2019-02-10 LAB — TSH: TSH: 1.8 u[IU]/mL (ref 0.450–4.500)

## 2019-02-10 NOTE — Telephone Encounter (Signed)
Pt notified, also states has had 2 episodes of hand and facial numbness.  I told her to definitely get that checked out at ER

## 2019-02-10 NOTE — Telephone Encounter (Signed)
Patient has a appointment on 02-15-19 t see Eulogio Ditch

## 2019-02-10 NOTE — Telephone Encounter (Signed)
We can bring her in with a carotid duplex.  With numbness of the face and arm that comes and goes, this may not be a vascular issue.  I would highly suggest that she also touch base with her PCP about this as well.  If we are unable to determine a vascular cause for her numbness we will defer back to her PCP.She can see me or Dew

## 2019-02-10 NOTE — Telephone Encounter (Signed)
If she was unable to get xultophy I wanted her to go down to 40 units of lantus. I do want her to start at 30 units of xultophy since it is a combination medication and we will titrate it up as needed based off her blood sugar readings at her one month follow-up. If she has fasting readings over 160 let us know beforehand.   For the hydroxyzine she can take one whole capsule, being mindful it can make her drowsy so to be sure she is at home and can rest afterwards.

## 2019-02-12 IMAGING — MG MM DIGITAL SCREENING BILAT W/ TOMO W/ CAD
6 of 10 series · 6 of 30 positions shown · non-contrast
Comparison: Previous exam(s).

CLINICAL DATA: Screening.

EXAM:
DIGITAL SCREENING BILATERAL MAMMOGRAM WITH TOMO AND CAD

[R CC synth-2D]
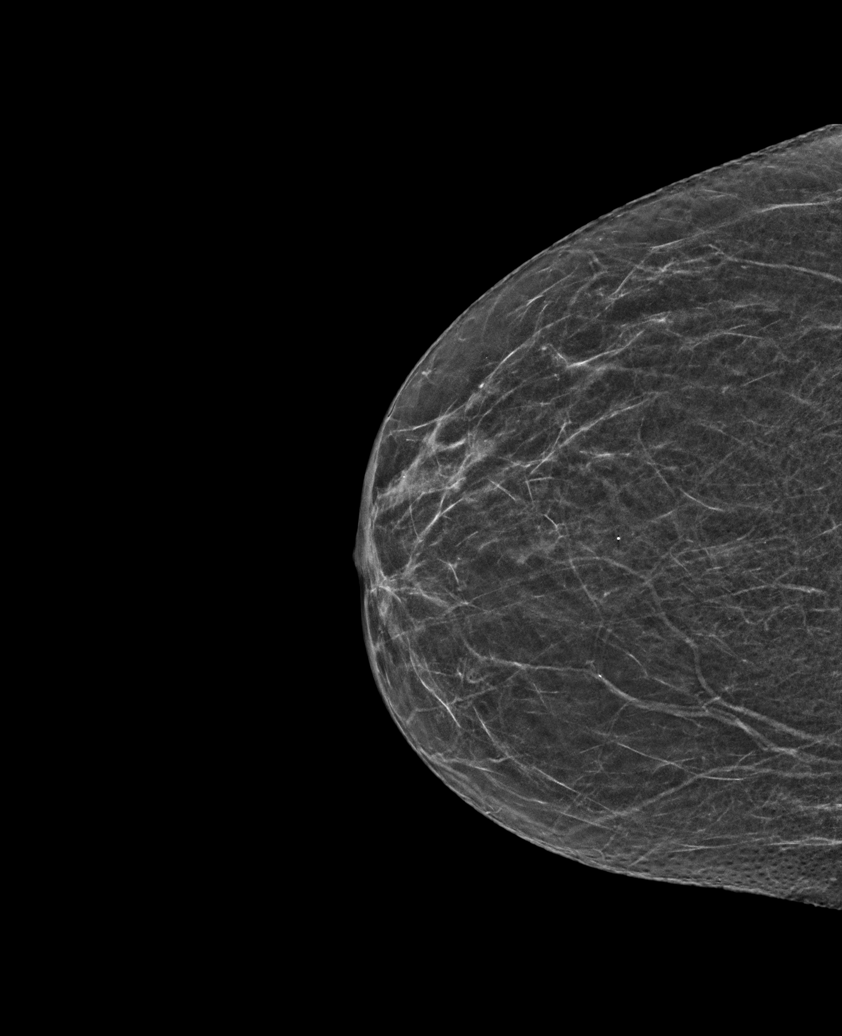

[L MLO synth-2D (1 of 2)]
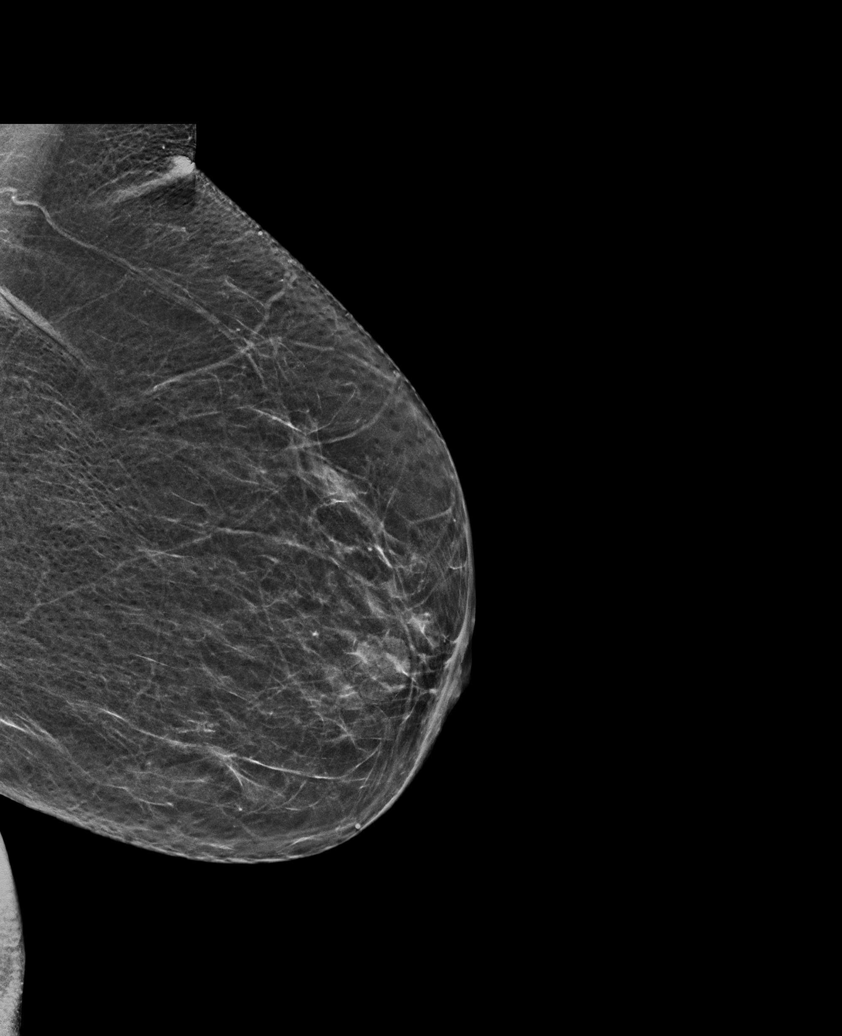

[L MLO synth-2D (2 of 2)]
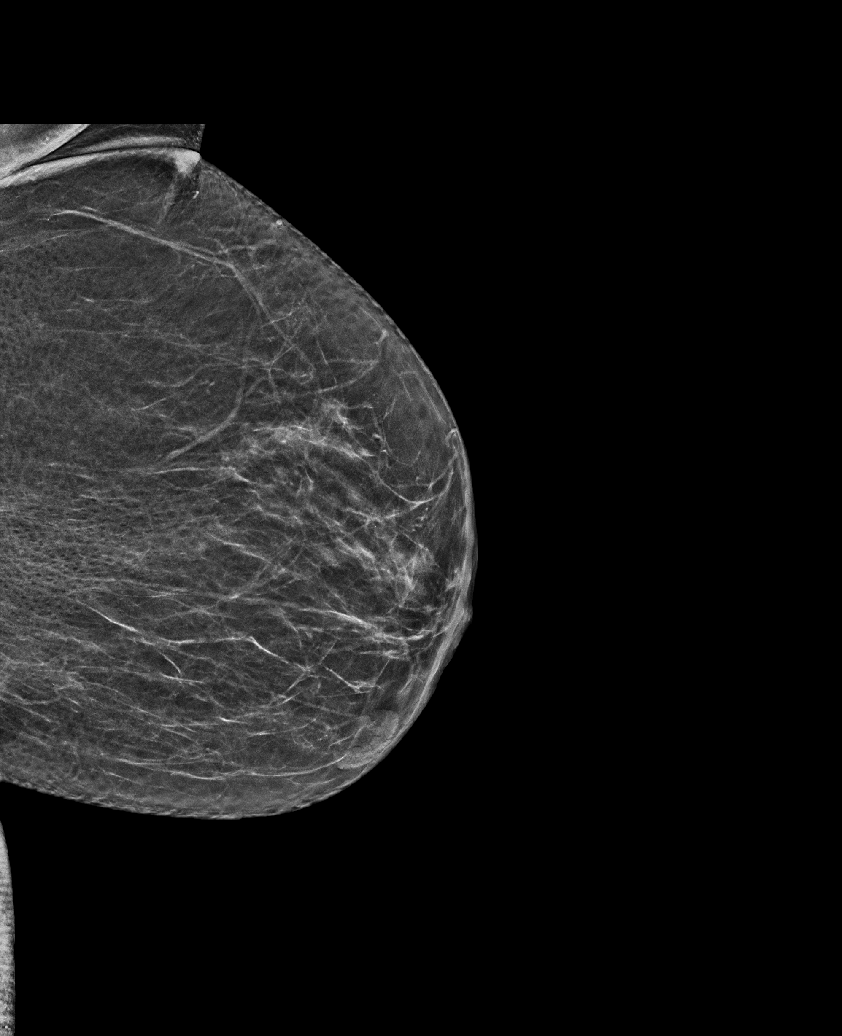

[R MLO synth-2D]
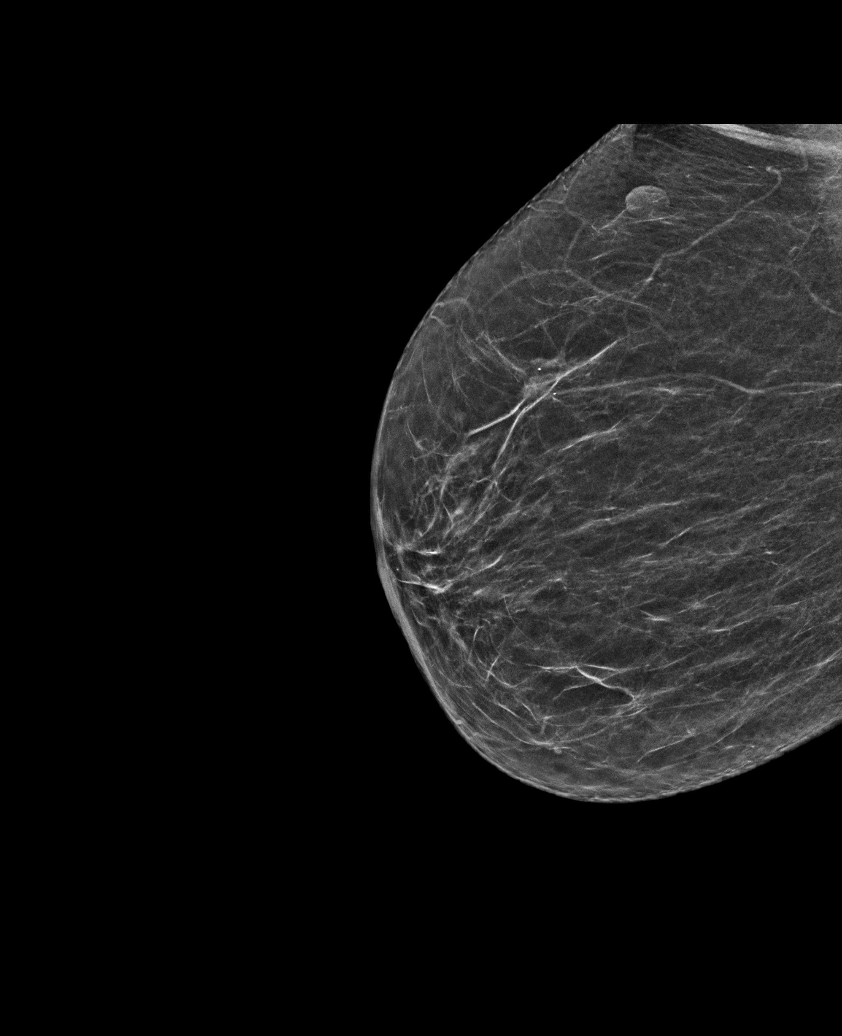

[L CC synth-2D]
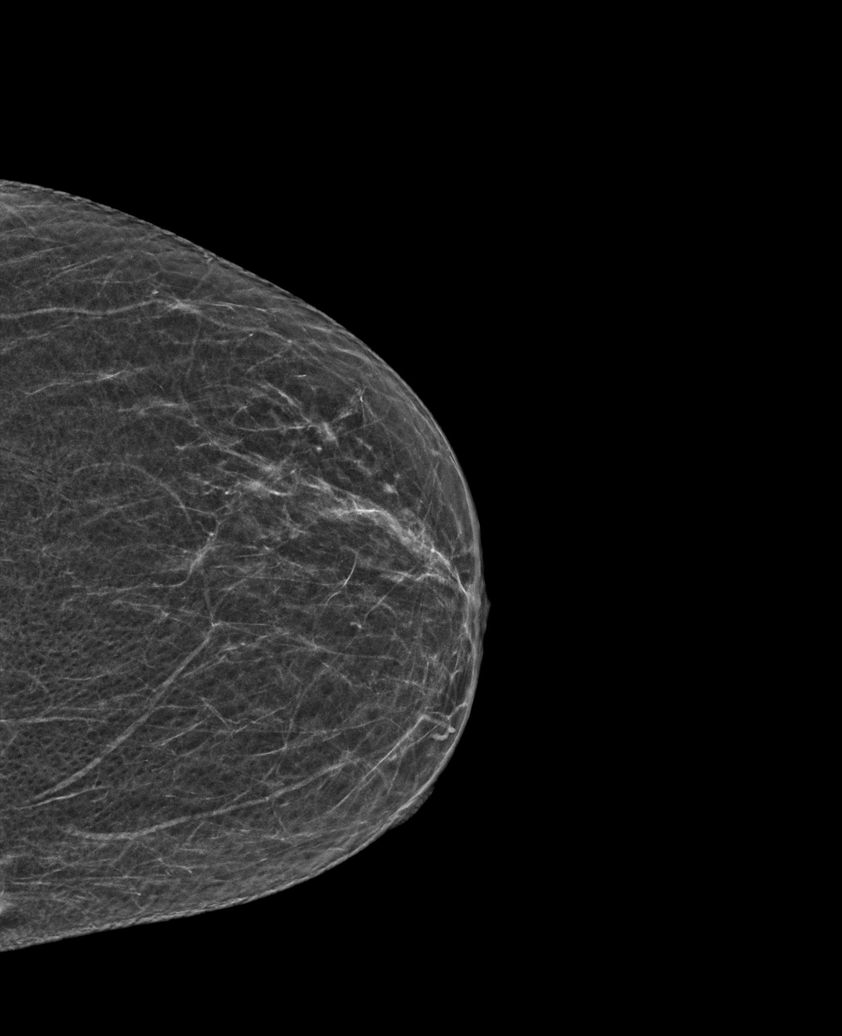

[L MLO tomo · tomo slice 29/56.0]
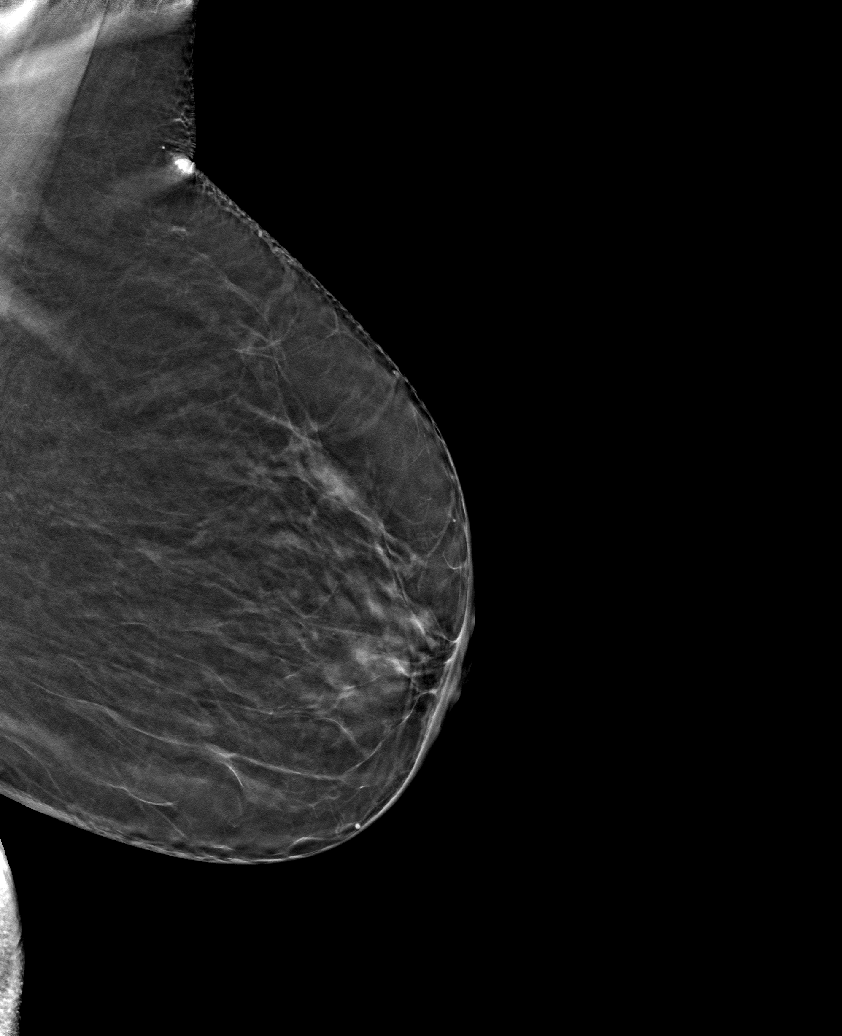

[6 of 30 positions shown; findings below may reference images not displayed]

ACR Breast Density Category b: There are scattered areas of
fibroglandular density.
FINDINGS: There are no findings suspicious for malignancy. Images were
processed with CAD.
IMPRESSION: No mammographic evidence of malignancy. A result letter of this
screening mammogram will be mailed directly to the patient.

RECOMMENDATION:
Screening mammogram in one year. (Code:[id number])

BI-RADS CATEGORY  1: Negative.

## 2019-02-15 ENCOUNTER — Ambulatory Visit (INDEPENDENT_AMBULATORY_CARE_PROVIDER_SITE_OTHER): Payer: PPO | Admitting: Nurse Practitioner

## 2019-02-15 ENCOUNTER — Other Ambulatory Visit (INDEPENDENT_AMBULATORY_CARE_PROVIDER_SITE_OTHER): Payer: Self-pay | Admitting: Vascular Surgery

## 2019-02-15 ENCOUNTER — Encounter (INDEPENDENT_AMBULATORY_CARE_PROVIDER_SITE_OTHER): Payer: Self-pay | Admitting: Nurse Practitioner

## 2019-02-15 ENCOUNTER — Other Ambulatory Visit (INDEPENDENT_AMBULATORY_CARE_PROVIDER_SITE_OTHER): Payer: Self-pay | Admitting: Nurse Practitioner

## 2019-02-15 ENCOUNTER — Other Ambulatory Visit: Payer: Self-pay

## 2019-02-15 ENCOUNTER — Ambulatory Visit (INDEPENDENT_AMBULATORY_CARE_PROVIDER_SITE_OTHER): Payer: PPO

## 2019-02-15 VITALS — BP 134/74 | HR 82 | Resp 16 | Ht 64.0 in | Wt 169.0 lb

## 2019-02-15 DIAGNOSIS — E1122 Type 2 diabetes mellitus with diabetic chronic kidney disease: Secondary | ICD-10-CM | POA: Diagnosis not present

## 2019-02-15 DIAGNOSIS — I129 Hypertensive chronic kidney disease with stage 1 through stage 4 chronic kidney disease, or unspecified chronic kidney disease: Secondary | ICD-10-CM

## 2019-02-15 DIAGNOSIS — R200 Anesthesia of skin: Secondary | ICD-10-CM

## 2019-02-15 DIAGNOSIS — M170 Bilateral primary osteoarthritis of knee: Secondary | ICD-10-CM | POA: Diagnosis not present

## 2019-02-15 DIAGNOSIS — I6529 Occlusion and stenosis of unspecified carotid artery: Secondary | ICD-10-CM | POA: Diagnosis not present

## 2019-02-21 ENCOUNTER — Encounter (INDEPENDENT_AMBULATORY_CARE_PROVIDER_SITE_OTHER): Payer: Self-pay | Admitting: Nurse Practitioner

## 2019-02-21 NOTE — Progress Notes (Signed)
SUBJECTIVE:  Patient ID: Charlotte Herrera, female    DOB: Nov 12, 1945, 73 y.o.   MRN: [id number] Chief Complaint  Patient presents with  . Follow-up    ultrasound    HPI  Charlotte Herrera is a 73 y.o. female that presents to the office with complaints of numbness on the right side of her face and arm on and off for about 2 months.  There is no inciting factors.  The patient states this happens at random moments.  She states that the pain is much less of a numbness but more of a tingling sensation.  She was concerned that there could be possibly an issue with her carotid arteries.  She denies any fever, chills, nausea, vomiting or diarrhea.  She denies any chest pain or shortness of breath.  Today she underwent noninvasive studies.   Duplex ultrasound shows 1-39% stenosis bilaterally.  Patent right carotid endarterectomy. Past Medical History:  Diagnosis Date  . Allergy   . Arthritis    hands, shoulders, knees, feet  . Calcification of abdominal aorta (HCC) 09/03/2017   CT scan [facility] Feb 2019  . Cirrhosis ([place]) 09/03/2017   Chest CT [facility] Feb 2019  . Depression   . Diabetes mellitus, type 2 ([place])   . Enlarged liver   . Family history of adverse reaction to anesthesia    Mother - PONV  . Fibromyalgia   . Fibromyalgia affecting multiple sites    pinched nerve in back of neck  . GERD (gastroesophageal reflux disease)   . Headache   . Hyperlipidemia   . Hypertension   . Hypothyroidism   . Motion sickness   . PONV (postoperative nausea and vomiting)   . Stroke ([place])   . Thyroid disease   . TIA (transient ischemic attack)   . Vertigo    after cataract surgery    Past Surgical History:  Procedure Laterality Date  . ABDOMINAL HYSTERECTOMY    . CARPAL TUNNEL RELEASE Left   . COLONOSCOPY  2015  . COLONOSCOPY WITH PROPOFOL N/A 11/05/2017   Procedure: COLONOSCOPY WITH PROPOFOL;  Surgeon: Lucilla Lame, MD;  Location: [place];  Service: Endoscopy;  Laterality: N/A;  Diabetic  .  COLONOSCOPY WITH PROPOFOL N/A 02/25/2018   Procedure: COLONOSCOPY WITH PROPOFOL;  Surgeon: Lucilla Lame, MD;  Location: [place];  Service: Endoscopy;  Laterality: N/A;  . ESOPHAGOGASTRODUODENOSCOPY (EGD) WITH PROPOFOL N/A 11/05/2017   Procedure: ESOPHAGOGASTRODUODENOSCOPY (EGD) WITH PROPOFOL;  Surgeon: Lucilla Lame, MD;  Location: [place];  Service: Endoscopy;  Laterality: N/A;  . ESOPHAGOGASTRODUODENOSCOPY (EGD) WITH PROPOFOL N/A 02/25/2018   Procedure: ESOPHAGOGASTRODUODENOSCOPY (EGD) WITH PROPOFOL;  Surgeon: Lucilla Lame, MD;  Location: [place];  Service: Endoscopy;  Laterality: N/A;  diabetic - insulin and oral meds  . SPINE SURGERY    . VARICOSE VEIN SURGERY Left 1975    Social History   Socioeconomic History  . Marital status: Divorced    Spouse name: Not on file  . Number of children: 3  . Years of education: some college  . Highest education level: GED or equivalent  Occupational History  . Occupation: Surveyor, mining:  [employer]  Social Needs  . Financial resource strain: Somewhat hard  . Food insecurity    Worry: Sometimes true    Inability: Never true  . Transportation needs    Medical: No    Non-medical: No  Tobacco Use  . Smoking status: Never Smoker  . Smokeless tobacco: Never  Used  . Tobacco comment: smoking cessation materials not required  Substance and Sexual Activity  . Alcohol use: No  . Drug use: No  . Sexual activity: Not Currently  Lifestyle  . Physical activity    Days per week: 3 days    Minutes per session: 10 min  . Stress: Very much  Relationships  . Social connections    Talks on phone: More than three times a week    Gets together: Once a week    Attends religious service: Never    Active member of club or organization: No    Attends meetings of clubs or organizations: Never    Relationship status: Divorced  . Intimate partner violence    Fear of current or ex partner: No    Emotionally  abused: No    Physically abused: No    Forced sexual activity: No  Other Topics Concern  . Not on file  Social History Narrative  . Not on file    Family History  Problem Relation Age of Onset  . Cancer Mother   . Ovarian cancer Mother   . Diabetes Father   . Cancer Father   . Prostate cancer Father   . Heart disease Father   . Diabetes Brother   . Cancer Brother   . Breast cancer Maternal Aunt        40's  . Heart disease Daughter   . Healthy Daughter   . Healthy Daughter   . Ovarian cancer Maternal Aunt   . Liver cancer Maternal Grandmother   . Heart failure Maternal Grandfather     Allergies  Allergen Reactions  . Aspirin   . Duloxetine Hcl   . Penicillins   . Codeine Nausea And Vomiting and Rash  . Lyrica [Pregabalin] Nausea And Vomiting  . Sulfa Antibiotics Swelling     Review of Systems   Review of Systems: Negative Unless Checked Constitutional: [] Weight loss  [] Fever  [] Chills Cardiac: [] Chest pain   []  Atrial Fibrillation  [] Palpitations   [] Shortness of breath when laying flat   [] Shortness of breath with exertion. [] Shortness of breath at rest Vascular:  [] Pain in legs with walking   [] Pain in legs with standing [] Pain in legs when laying flat   [] Claudication    [] Pain in feet when laying flat    [] History of DVT   [] Phlebitis   [] Swelling in legs   [] Varicose veins   [] Non-healing ulcers Pulmonary:   [] Uses home oxygen   [] Productive cough   [] Hemoptysis   [] Wheeze  [] COPD   [] Asthma Neurologic:  [] Dizziness   [] Seizures  [] Blackouts [] History of stroke   [] History of TIA  [] Aphasia   [] Temporary Blindness   [] Weakness or numbness in arm   [] Weakness or numbness in leg Musculoskeletal:   [] Joint swelling   [] Joint pain   [] Low back pain  []  History of Knee Replacement [x] Arthritis [] back Surgeries  []  Spinal Stenosis    Hematologic:  [] Easy bruising  [] Easy bleeding   [] Hypercoagulable state   [] Anemic Gastrointestinal:  [] Diarrhea   [] Vomiting   [x] Gastroesophageal reflux/heartburn   [] Difficulty swallowing. [] Abdominal pain Genitourinary:  [] Chronic kidney disease   [] Difficult urination  [] Anuric   [] Blood in urine [] Frequent urination  [] Burning with urination   [] Hematuria Skin:  [] Rashes   [] Ulcers [] Wounds Psychological:  [] History of anxiety   []  History of major depression  []  Memory Difficulties      OBJECTIVE:   Physical Exam  BP 134/74 (BP  Location: Left Arm)   Pulse 82   Resp 16   Ht 5' 4"  (1.626 m)   Wt 169 lb (76.7 kg)   BMI 29.01 kg/m   Gen: WD/WN, NAD Head: [place]/AT, No temporalis wasting.  Ear/Nose/Throat: Hearing grossly intact, nares w/o erythema or drainage Eyes: PER, EOMI, sclera nonicteric.  Neck: Supple, no masses.  No JVD.  Pulmonary:  Good air movement, no use of accessory muscles.  Cardiac: RRR Vascular:  Vessel Right Left  Radial Palpable Palpable   Gastrointestinal: soft, non-distended. No guarding/no peritoneal signs.  Musculoskeletal: M/S 5/5 throughout.  No deformity or atrophy.  Neurologic: Pain and light touch intact in extremities.  Symmetrical.  Speech is fluent. Motor exam as listed above. Psychiatric: Judgment intact, Mood & affect appropriate for pt's clinical situation. Dermatologic: No Venous rashes. No Ulcers Noted.  No changes consistent with cellulitis. Lymph : No Cervical lymphadenopathy, no lichenification or skin changes of chronic lymphedema.       ASSESSMENT AND PLAN:  1. Stenosis of carotid artery, unspecified laterality Recommend:  Given the patient's asymptomatic subcritical stenosis no further invasive testing or surgery at this time.  Duplex ultrasound shows 1-39% stenosis bilaterally.  Continue antiplatelet therapy as prescribed Continue management of CAD, HTN and Hyperlipidemia Healthy heart diet,  encouraged exercise at least 4 times per week Follow up in 12 months with duplex ultrasound and physical exam  - VAS US CAROTID; Future  2. Osteoarthritis of  both knees, unspecified osteoarthritis type Continue NSAID medications as already ordered, these medications have been reviewed and there are no changes at this time.  Continued activity and therapy was stressed.   3. 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    Current Outpatient Medications on File Prior to Visit  Medication Sig Dispense Refill  . Blood Glucose Monitoring Suppl (ONE TOUCH ULTRA MINI) w/Device KIT 1 Device by Does not apply route 2 (two) times daily. 1 each 0  . clopidogrel (PLAVIX) 75 MG tablet Take 1 tablet (75 mg total) by mouth daily. 90 tablet 3  . Continuous Blood Gluc Sensor (DEXCOM G6 SENSOR) MISC 1 Device by Does not apply route every 14 (fourteen) days. 6 each 3  . diclofenac sodium (VOLTAREN) 1 % GEL Apply 2 g topically 2 (two) times daily as needed. 100 g 2  . fluticasone (FLONASE) 50 MCG/ACT nasal spray Place 2 sprays into both nostrils daily. 16 g 11  . gabapentin (NEURONTIN) 300 MG capsule TAKE 1 CAPSULE(300 MG) BY MOUTH AT BEDTIME 90 capsule 1  . glucose blood (ONE TOUCH ULTRA TEST) test strip TEST twice a day 180 each 3  . Insulin Degludec-Liraglutide (XULTOPHY) 100-3.6 UNIT-MG/ML SOPN Inject 30 Units into the skin daily. 3 pen 0  . Insulin Pen Needle (BD PEN NEEDLE MICRO U/F) 32G X 6 MM MISC USE AS DIRECTED once AT BEDTIME for insulin administration DX:E11.42 Lon:99 months 100 each 3  . ketoconazole (NIZORAL) 2 % cream APPLY TOPICALLY TO THE AFFECTED AREA DAILY AS NEEDED FOR IRRITATION( WHERE NEEDED) 60 g 2  . levothyroxine (SYNTHROID) 50 MCG tablet Take 1 tablet (50 mcg total) by mouth daily before breakfast. 90 tablet 1  . losartan (COZAAR) 25 MG tablet Take 1 tablet (25 mg total) by mouth daily. 90 tablet 0  . metFORMIN (GLUCOPHAGE) 500 MG tablet TAKE 1 TABLET(500 MG) BY MOUTH TWICE  DAILY WITH A MEAL 60 tablet 2  . OneTouch Delica Lancets 93Q MISC 33 g by Does not apply route 2 (two) times daily. Test twice a day as directed 100 each 2  . pantoprazole (PROTONIX) 20 MG tablet TAKE 1 TABLET BY MOUTH DAILY FOR HEARTBURN, REFLUX; PROLONGED USE MAY INCREASE RISK OF PNEUMONIA, COLITIS, OSTEOPOROSIS, ANEMIA 90 tablet 0  . pravastatin (PRAVACHOL) 20 MG tablet Take 1 tablet (20 mg total) by mouth at bedtime. For cholesterol 90 tablet 3  . buPROPion (WELLBUTRIN XL) 150 MG 24 hr tablet Take 150 mg by mouth daily.    . hydrOXYzine (VISTARIL) 25 MG capsule Take 1 capsule (25 mg total) by mouth daily as needed for anxiety. (Patient not taking: Reported on 02/15/2019) 30 capsule 0  . Insulin Glargine (LANTUS SOLOSTAR) 100 UNIT/ML Solostar Pen Inject 40 Units into the skin at bedtime. (Patient not taking: Reported on 02/15/2019) 5 pen 11  . traZODone (DESYREL) 50 MG tablet TAKE 1/2 TO 1 TABLET(25 TO 50 MG) BY MOUTH AT BEDTIME AS NEEDED FOR SLEEP (Patient not taking: Reported on 02/08/2019) 30 tablet 0   No current facility-administered medications on file prior to visit.     There are no Patient Instructions on file for this visit. No follow-ups on file.   Kris Hartmann, NP  This note was completed with Sales executive.  Any errors are purely unintentional.

## 2019-02-27 ENCOUNTER — Other Ambulatory Visit: Payer: Self-pay | Admitting: Family Medicine

## 2019-02-27 DIAGNOSIS — E1142 Type 2 diabetes mellitus with diabetic polyneuropathy: Secondary | ICD-10-CM

## 2019-02-27 DIAGNOSIS — IMO0002 Reserved for concepts with insufficient information to code with codable children: Secondary | ICD-10-CM

## 2019-02-27 MED ORDER — XULTOPHY 100-3.6 UNIT-MG/ML ~~LOC~~ SOPN: 38.0000 [IU] | PEN_INJECTOR | Freq: Every day | SUBCUTANEOUS | 0 refills | Status: DC

## 2019-02-27 NOTE — Telephone Encounter (Signed)
Medication: Claris Che 100-3.6 UNIT-MG/ML SOPN [id number]  DISCONTINUED  Has the patient contacted their pharmacy? Yes  (Agent: If no, request that the patient contact the pharmacy for the refill.) (Agent: If yes, when and what did the pharmacy advise?)  Preferred Pharmacy (with phone number or street name): [facility] [id number], [place] - [address] [phone] (Phone) [phone] (Fax)    Agent: Please be advised that RX refills may take up to 3 business days. We ask that you follow-up with your pharmacy.

## 2019-02-27 NOTE — Telephone Encounter (Signed)
Pt needs refills she is currently taking 38 units

## 2019-03-10 ENCOUNTER — Other Ambulatory Visit: Payer: Self-pay

## 2019-03-10 ENCOUNTER — Ambulatory Visit: Payer: PPO | Admitting: Family Medicine

## 2019-03-16 ENCOUNTER — Telehealth: Payer: Self-pay | Admitting: Family Medicine

## 2019-03-20 ENCOUNTER — Other Ambulatory Visit: Payer: Self-pay | Admitting: Family Medicine

## 2019-03-20 ENCOUNTER — Encounter: Payer: Self-pay | Admitting: Family Medicine

## 2019-03-20 ENCOUNTER — Ambulatory Visit (INDEPENDENT_AMBULATORY_CARE_PROVIDER_SITE_OTHER): Payer: PPO | Admitting: Family Medicine

## 2019-03-20 DIAGNOSIS — IMO0002 Reserved for concepts with insufficient information to code with codable children: Secondary | ICD-10-CM

## 2019-03-20 DIAGNOSIS — E1165 Type 2 diabetes mellitus with hyperglycemia: Secondary | ICD-10-CM | POA: Diagnosis not present

## 2019-03-20 DIAGNOSIS — Z1239 Encounter for other screening for malignant neoplasm of breast: Secondary | ICD-10-CM

## 2019-03-20 DIAGNOSIS — E1142 Type 2 diabetes mellitus with diabetic polyneuropathy: Secondary | ICD-10-CM

## 2019-03-20 MED ORDER — XULTOPHY 100-3.6 UNIT-MG/ML ~~LOC~~ SOPN: 40.0000 [IU] | PEN_INJECTOR | Freq: Every day | SUBCUTANEOUS | 2 refills | Status: AC

## 2019-03-20 NOTE — Telephone Encounter (Signed)
Requested Prescriptions  Pending Prescriptions Disp Refills  . metFORMIN (GLUCOPHAGE) 500 MG tablet [Pharmacy Med Name: METFORMIN 500MG TABLETS] 270 tablet 0    Sig: TAKE 1 TO 2 TABLETS BY MOUTH TWICE DAILY WITH MEALS. MAXIMUM DAILY DOSE IS 3 TABLETS     Endocrinology:  Diabetes - Biguanides Failed - 03/20/2019  5:39 PM      Failed - Cr in normal range and within 360 days    Creat  Date Value Ref Range Status  04/06/2017 0.97 (H) 0.60 - 0.93 mg/dL Final    Comment:    For patients >54 years of age, the reference limit for Creatinine is approximately 13% higher for people identified as African-American. .    Creatinine, Ser  Date Value Ref Range Status  02/09/2019 1.04 (H) 0.57 - 1.00 mg/dL Final         Passed - HBA1C is between 0 and 7.9 and within 180 days    Hemoglobin A1C  Date Value Ref Range Status  10/31/2018 8.3  Final   Hgb A1c MFr Bld  Date Value Ref Range Status  02/09/2019 7.9 (H) 4.8 - 5.6 % Final    Comment:             Prediabetes: 5.7 - 6.4          Diabetes: >6.4          Glycemic control for adults with diabetes: <7.0          Passed - eGFR in normal range and within 360 days    GFR, Est African American  Date Value Ref Range Status  04/06/2017 68 > OR = 60 mL/min/1.68m Final   GFR calc Af Amer  Date Value Ref Range Status  02/09/2019 62 >59 mL/min/1.73 Final   GFR, Est Non African American  Date Value Ref Range Status  04/06/2017 59 (L) > OR = 60 mL/min/1.76mFinal   GFR calc non Af Amer  Date Value Ref Range Status  02/09/2019 53 (L) >59 mL/min/1.73 Final         Passed - Valid encounter within last 6 months    Recent Outpatient Visits          Today [facility]  [facility]   1 month ago [facility] ([facility]  CHStill[place]NP   3 months ago Uncontrolled type 2 diabetes mellitus  with peripheral neuropathy ([facility]  CHCol[place]NP   4 months ago [facility] abdominal [facility] ([facility]  CHWeberNP   9 months ago Cough   [facility]incolnNP      Future Appointments            In 3 months TaDelsa GranaPA-C [facility] [place] In 3 months  [facility]

## 2019-03-20 NOTE — Telephone Encounter (Signed)
Requested medication (s) are due for refill today: yes  Requested medication (s) are on the active medication list: yes  Last refill:  12/16/2018  Future visit scheduled: yes  Notes to clinic:  Review for refill   Requested Prescriptions  Pending Prescriptions Disp Refills   metFORMIN (GLUCOPHAGE) 500 MG tablet [Pharmacy Med Name: METFORMIN 500MG TABLETS] 60 tablet 2    Sig: TAKE 1 TABLET(500 MG) BY MOUTH TWICE DAILY WITH MEALS     Endocrinology:  Diabetes - Biguanides Failed - 03/20/2019 10:21 AM      Failed - Cr in normal range and within 360 days    Creat  Date Value Ref Range Status  04/06/2017 0.97 (H) 0.60 - 0.93 mg/dL Final    Comment:    For patients >25 years of age, the reference limit for Creatinine is approximately 13% higher for people identified as African-American. .    Creatinine, Ser  Date Value Ref Range Status  02/09/2019 1.04 (H) 0.57 - 1.00 mg/dL Final         Passed - HBA1C is between 0 and 7.9 and within 180 days    Hemoglobin A1C  Date Value Ref Range Status  10/31/2018 8.3  Final   Hgb A1c MFr Bld  Date Value Ref Range Status  02/09/2019 7.9 (H) 4.8 - 5.6 % Final    Comment:             Prediabetes: 5.7 - 6.4          Diabetes: >6.4          Glycemic control for adults with diabetes: <7.0          Passed - eGFR in normal range and within 360 days    GFR, Est African American  Date Value Ref Range Status  04/06/2017 68 > OR = 60 mL/min/1.53m Final   GFR calc Af Amer  Date Value Ref Range Status  02/09/2019 62 >59 mL/min/1.73 Final   GFR, Est Non African American  Date Value Ref Range Status  04/06/2017 59 (L) > OR = 60 mL/min/1.751mFinal   GFR calc non Af Amer  Date Value Ref Range Status  02/09/2019 53 (L) >59 mL/min/1.73 Final         Passed - Valid encounter within last 6 months    Recent Outpatient Visits          1 month ago [facility]  CHBenedict NP   3 months ago Uncontrolled type 2 diabetes mellitus with peripheral neuropathy ([facility]  CHDoolingNP   4 months ago [facility] abdominal [facility] ([facility]  CHMcNabbNP   9 months ago Cough   CHNorth YorkNP   11 months ago Flu vaccine need   [facility], MeSatira AnisMD      Future Appointments            Today TaDelsa GranaPA-C [facility]Brad[facility] In 1 month TaDelsa GranaPA-C [facility]ERiver[facility] In 3 months  [facility]

## 2019-03-20 NOTE — Progress Notes (Signed)
Name: Charlotte Herrera   MRN: [id number]    DOB: 04/19/1946   Date:03/20/2019       Progress Note  Subjective:    Chief Complaint  Chief Complaint  Patient presents with  . Follow-up    1 month f/u. patient stated that she has been working on her diabetes. she has been taking DM meds as directed and she checks her blood sugar "about every other day"   . Medication Refill    xultophy    I connected with  [name] on 03/20/19 at  1:00 PM EDT by telephone and verified that I am speaking with the correct person using two identifiers.   I discussed the limitations, risks, security and privacy concerns of performing an evaluation and management service by telephone and the availability of in person appointments. Staff also discussed with the patient that there may be a patient responsible charge related to this service. Patient Location: home Provider [facility]  Additional Individuals present: none  HPI  DM follow up: Changed from lantus 42 units to Xultophy started at 30units and to titrate up.   She is continuing to take metformin For Xultophy, currently at 40 units at night.  She is compliant with meds, feels like shes running out of pens fast, only got 3 at a time.  She has some decreased appetite, and no other SE or concerns. Blood sugars in the morning are 100-120's.  No low CBG.  Glucose a little higher if she eats after 6 pm.  She likes the meds, but it is costly, last 3 pens cost $98   She also believes she is due for mammogram - delayed due to [place] pandemic.  Order put back in, she has contact # to schedule imaging.    Patient Active Problem List   Diagnosis Date Noted  . Diabetic neuropathy ([place]) 12/29/2018  . Hammer toe of second toe of right foot 12/29/2018  . Chronic arthropathy 12/29/2018  . Gastritis without bleeding   . Carotid stenosis 01/04/2018  . 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  Anxiety, generalized 09/03/2017  . MDD (major depressive disorder) 09/03/2017  . Calcification of abdominal aorta (HCC) 09/03/2017  . Cirrhosis ([place]) 09/03/2017  . Hx of transient ischemic attack (TIA) 07/06/2017  . H/O carotid endarterectomy 05/28/2017  . Plantar fasciitis 11/27/2016  . Transaminitis 03/11/2016  . Environmental and seasonal allergies 12/05/2015  . Lipoma of abdominal wall 12/05/2015  . Osteoarthritis of knee 08/05/2015  . Chronic gout involving toe of right foot without tophus 05/28/2015  . Hypothyroidism 05/14/2015  . Impingement syndrome of shoulder region 01/17/2015  . Vitamin B12 deficiency 12/25/2014  . Vitamin D deficiency 12/25/2014  . Uncontrolled type 2 diabetes mellitus with peripheral neuropathy ([place]) 12/25/2014  . Hx of transient ischemic attack (TIA) 12/25/2014  . Abnormal liver enzymes 12/25/2014    Social History   Tobacco Use  . Smoking status: Never Smoker  . Smokeless tobacco: Never Used  . Tobacco comment: smoking cessation materials not required  Substance Use Topics  . Alcohol use: No     Current Outpatient Medications:  .  Blood Glucose Monitoring Suppl (ONE TOUCH ULTRA MINI) w/Device KIT, 1 Device by Does not apply route 2 (two) times daily., Disp: 1 each, Rfl: 0 .  Cholecalciferol (VITAMIN D-3) 25 MCG (1000 UT) CAPS, Take by mouth., Disp: , Rfl:  .  clopidogrel (PLAVIX) 75 MG tablet, Take  1 tablet (75 mg total) by mouth daily., Disp: 90 tablet, Rfl: 3 .  Continuous Blood Gluc Sensor (DEXCOM G6 SENSOR) MISC, 1 Device by Does not apply route every 14 (fourteen) days., Disp: 6 each, Rfl: 3 .  Cyanocobalamin (VITAMIN B 12 PO), Take by mouth., Disp: , Rfl:  .  diclofenac sodium (VOLTAREN) 1 % GEL, Apply 2 g topically 2 (two) times daily as needed., Disp: 100 g, Rfl: 2 .  fluticasone (FLONASE) 50 MCG/ACT nasal spray, Place 2 sprays into both nostrils daily., Disp: 16 g, Rfl: 11 .  gabapentin (NEURONTIN) 300 MG capsule, TAKE 1 CAPSULE(300 MG)  BY MOUTH AT BEDTIME, Disp: 90 capsule, Rfl: 1 .  glucose blood (ONE TOUCH ULTRA TEST) test strip, TEST twice a day, Disp: 180 each, Rfl: 3 .  hydrOXYzine (VISTARIL) 25 MG capsule, Take 1 capsule (25 mg total) by mouth daily as needed for anxiety., Disp: 30 capsule, Rfl: 0 .  Insulin Degludec-Liraglutide (XULTOPHY) 100-3.6 UNIT-MG/ML SOPN, Inject 38 Units into the skin daily., Disp: 3 pen, Rfl: 0 .  Insulin Pen Needle (BD PEN NEEDLE MICRO U/F) 32G X 6 MM MISC, USE AS DIRECTED once AT BEDTIME for insulin administration DX:E11.42 Lon:99 months, Disp: 100 each, Rfl: 3 .  ketoconazole (NIZORAL) 2 % cream, APPLY TOPICALLY TO THE AFFECTED AREA DAILY AS NEEDED FOR IRRITATION( WHERE NEEDED), Disp: 60 g, Rfl: 2 .  levothyroxine (SYNTHROID) 50 MCG tablet, Take 1 tablet (50 mcg total) by mouth daily before breakfast., Disp: 90 tablet, Rfl: 1 .  losartan (COZAAR) 25 MG tablet, Take 1 tablet (25 mg total) by mouth daily., Disp: 90 tablet, Rfl: 0 .  metFORMIN (GLUCOPHAGE) 500 MG tablet, TAKE 1 TABLET(500 MG) BY MOUTH TWICE DAILY WITH A MEAL, Disp: 60 tablet, Rfl: 2 .  OneTouch Delica Lancets 40J MISC, 33 g by Does not apply route 2 (two) times daily. Test twice a day as directed, Disp: 100 each, Rfl: 2 .  pantoprazole (PROTONIX) 20 MG tablet, TAKE 1 TABLET BY MOUTH DAILY FOR HEARTBURN, REFLUX; PROLONGED USE MAY INCREASE RISK OF PNEUMONIA, COLITIS, OSTEOPOROSIS, ANEMIA, Disp: 90 tablet, Rfl: 0 .  pravastatin (PRAVACHOL) 20 MG tablet, Take 1 tablet (20 mg total) by mouth at bedtime. For cholesterol, Disp: 90 tablet, Rfl: 3 .  buPROPion (WELLBUTRIN XL) 150 MG 24 hr tablet, Take 150 mg by mouth daily., Disp: , Rfl:  .  Insulin Glargine (LANTUS SOLOSTAR) 100 UNIT/ML Solostar Pen, Inject 40 Units into the skin at bedtime. (Patient not taking: Reported on 02/15/2019), Disp: 5 pen, Rfl: 11 .  traZODone (DESYREL) 50 MG tablet, TAKE 1/2 TO 1 TABLET(25 TO 50 MG) BY MOUTH AT BEDTIME AS NEEDED FOR SLEEP (Patient not taking:  Reported on 02/08/2019), Disp: 30 tablet, Rfl: 0  Allergies  Allergen Reactions  . Aspirin   . Duloxetine Hcl   . Penicillins   . Codeine Nausea And Vomiting and Rash  . Lyrica [Pregabalin] Nausea And Vomiting  . Sulfa Antibiotics Swelling    I personally reviewed active problem list, medication list, allergies, family history, social history, health maintenance, notes from last encounter, lab results with the patient/caregiver today.  Review of Systems  Constitutional: Negative.  Negative for chills, fever and malaise/fatigue.  HENT: Negative.   Eyes: Negative.   Respiratory: Negative.   Cardiovascular: Negative.   Gastrointestinal: Negative.   Genitourinary: Negative.  Negative for dysuria, flank pain, frequency, hematuria and urgency.  Musculoskeletal: Negative.   Skin: Negative.  Negative for itching and rash.  Neurological: Negative.  Negative for dizziness, tremors, weakness and headaches.  Endo/Heme/Allergies: Negative.  Negative for environmental allergies and polydipsia. Does not bruise/bleed easily.  Psychiatric/Behavioral: Negative.   All other systems reviewed and are negative.    Objective:    Virtual encounter, vitals limited, only able to obtain the following Today's Vitals   03/20/19 1147  PainSc: 1    There is no height or weight on file to calculate BMI. Nursing Note and Vital Signs reviewed.  Physical Exam Vitals signs and nursing note reviewed.  Pulmonary:     Effort: No respiratory distress.     Comments: No audible wheeze or stridor Neurological:     Mental Status: She is alert.  Psychiatric:        Mood and Affect: Mood normal.        Behavior: Behavior normal.     PE limited by telephone encounter  No results found for this or any previous visit (from the past 72 hour(s)).  Assessment and Plan:     ICD-10-CM   1. Uncontrolled type 2 diabetes mellitus with peripheral neuropathy (HCC)  E11.42 Insulin Degludec-Liraglutide (XULTOPHY)  100-3.6 UNIT-MG/ML SOPN   E11.65    blood sugars improving with meds, decreased appetite, some weight loss, no concerning SE, price an issue, refills given, f/up 3 months  2. Encounter for screening for malignant neoplasm of breast  Z12.39 MM 3D SCREEN BREAST BILATERAL      -Red flags and when to present for emergency care or RTC including fever >101.78F, chest pain, shortness of breath, new/worsening/un-resolving symptoms -  reviewed with patient at time of visit. Follow up and care instructions discussed and provided in AVS. - I discussed the assessment and treatment plan with the patient. The patient was provided an opportunity to ask questions and all were answered. The patient agreed with the plan and demonstrated an understanding of the instructions.  - The patient was advised to call back or seek an in-person evaluation if the symptoms worsen or if the condition fails to improve as anticipated.  I provided 20 minutes of non-face-to-face time during this encounter.  Delsa Grana, PA-C 03/20/19 1:29 PM

## 2019-03-21 ENCOUNTER — Other Ambulatory Visit: Payer: Self-pay

## 2019-03-21 DIAGNOSIS — IMO0002 Reserved for concepts with insufficient information to code with codable children: Secondary | ICD-10-CM

## 2019-03-21 DIAGNOSIS — E1142 Type 2 diabetes mellitus with diabetic polyneuropathy: Secondary | ICD-10-CM

## 2019-03-21 MED ORDER — LOSARTAN POTASSIUM 25 MG PO TABS: 25.0000 mg | ORAL_TABLET | Freq: Every day | ORAL | 0 refills | Status: DC

## 2019-03-30 ENCOUNTER — Ambulatory Visit: Payer: PPO | Admitting: Podiatry

## 2019-04-18 ENCOUNTER — Other Ambulatory Visit: Payer: Self-pay

## 2019-04-19 MED ORDER — PANTOPRAZOLE SODIUM 20 MG PO TBEC: 20.0000 mg | DELAYED_RELEASE_TABLET | Freq: Every day | ORAL | 1 refills | Status: AC | PRN

## 2019-04-25 DIAGNOSIS — E118 Type 2 diabetes mellitus with unspecified complications: Secondary | ICD-10-CM | POA: Diagnosis not present

## 2019-04-25 DIAGNOSIS — Z23 Encounter for immunization: Secondary | ICD-10-CM | POA: Diagnosis not present

## 2019-04-25 DIAGNOSIS — I2510 Atherosclerotic heart disease of native coronary artery without angina pectoris: Secondary | ICD-10-CM | POA: Diagnosis not present

## 2019-04-25 DIAGNOSIS — M109 Gout, unspecified: Secondary | ICD-10-CM | POA: Diagnosis not present

## 2019-04-25 DIAGNOSIS — Z8249 Family history of ischemic heart disease and other diseases of the circulatory system: Secondary | ICD-10-CM | POA: Diagnosis not present

## 2019-04-25 DIAGNOSIS — E119 Type 2 diabetes mellitus without complications: Secondary | ICD-10-CM | POA: Diagnosis not present

## 2019-04-25 DIAGNOSIS — R5383 Other fatigue: Secondary | ICD-10-CM | POA: Diagnosis not present

## 2019-04-25 DIAGNOSIS — R7989 Other specified abnormal findings of blood chemistry: Secondary | ICD-10-CM | POA: Diagnosis not present

## 2019-04-25 DIAGNOSIS — E7800 Pure hypercholesterolemia, unspecified: Secondary | ICD-10-CM | POA: Diagnosis not present

## 2019-04-25 DIAGNOSIS — D519 Vitamin B12 deficiency anemia, unspecified: Secondary | ICD-10-CM | POA: Diagnosis not present

## 2019-04-25 DIAGNOSIS — E039 Hypothyroidism, unspecified: Secondary | ICD-10-CM | POA: Diagnosis not present

## 2019-04-25 DIAGNOSIS — K219 Gastro-esophageal reflux disease without esophagitis: Secondary | ICD-10-CM | POA: Diagnosis not present

## 2019-04-25 DIAGNOSIS — E559 Vitamin D deficiency, unspecified: Secondary | ICD-10-CM | POA: Diagnosis not present

## 2019-04-25 DIAGNOSIS — D649 Anemia, unspecified: Secondary | ICD-10-CM | POA: Diagnosis not present

## 2019-04-25 DIAGNOSIS — D509 Iron deficiency anemia, unspecified: Secondary | ICD-10-CM | POA: Diagnosis not present

## 2019-04-25 DIAGNOSIS — M898X9 Other specified disorders of bone, unspecified site: Secondary | ICD-10-CM | POA: Diagnosis not present

## 2019-04-25 DIAGNOSIS — I10 Essential (primary) hypertension: Secondary | ICD-10-CM | POA: Diagnosis not present

## 2019-04-25 DIAGNOSIS — Z0000 Encounter for general adult medical examination without abnormal findings: Secondary | ICD-10-CM | POA: Diagnosis not present

## 2019-04-25 DIAGNOSIS — E785 Hyperlipidemia, unspecified: Secondary | ICD-10-CM | POA: Diagnosis not present

## 2019-04-25 DIAGNOSIS — Z79899 Other long term (current) drug therapy: Secondary | ICD-10-CM | POA: Diagnosis not present

## 2019-04-25 DIAGNOSIS — E1140 Type 2 diabetes mellitus with diabetic neuropathy, unspecified: Secondary | ICD-10-CM | POA: Diagnosis not present

## 2019-05-01 ENCOUNTER — Other Ambulatory Visit: Payer: Self-pay

## 2019-05-02 MED ORDER — GABAPENTIN 300 MG PO CAPS: ORAL_CAPSULE | ORAL | 1 refills | Status: AC

## 2019-05-08 ENCOUNTER — Other Ambulatory Visit: Payer: Self-pay

## 2019-05-08 DIAGNOSIS — E038 Other specified hypothyroidism: Secondary | ICD-10-CM

## 2019-05-09 MED ORDER — LEVOTHYROXINE SODIUM 50 MCG PO TABS: 50.0000 ug | ORAL_TABLET | Freq: Every day | ORAL | 1 refills | Status: AC

## 2019-05-12 ENCOUNTER — Ambulatory Visit: Payer: PPO | Admitting: Family Medicine

## 2019-05-16 DIAGNOSIS — I6529 Occlusion and stenosis of unspecified carotid artery: Secondary | ICD-10-CM | POA: Diagnosis not present

## 2019-05-16 DIAGNOSIS — I361 Nonrheumatic tricuspid (valve) insufficiency: Secondary | ICD-10-CM | POA: Diagnosis not present

## 2019-05-16 DIAGNOSIS — E1140 Type 2 diabetes mellitus with diabetic neuropathy, unspecified: Secondary | ICD-10-CM | POA: Diagnosis not present

## 2019-05-16 DIAGNOSIS — I10 Essential (primary) hypertension: Secondary | ICD-10-CM | POA: Diagnosis not present

## 2019-05-16 DIAGNOSIS — Z8249 Family history of ischemic heart disease and other diseases of the circulatory system: Secondary | ICD-10-CM | POA: Diagnosis not present

## 2019-05-16 DIAGNOSIS — G459 Transient cerebral ischemic attack, unspecified: Secondary | ICD-10-CM | POA: Diagnosis not present

## 2019-05-17 DIAGNOSIS — M65341 Trigger finger, right ring finger: Secondary | ICD-10-CM | POA: Diagnosis not present

## 2019-06-01 DIAGNOSIS — L400 Psoriasis vulgaris: Secondary | ICD-10-CM | POA: Diagnosis not present

## 2019-06-01 DIAGNOSIS — L820 Inflamed seborrheic keratosis: Secondary | ICD-10-CM | POA: Diagnosis not present

## 2019-06-01 DIAGNOSIS — L304 Erythema intertrigo: Secondary | ICD-10-CM | POA: Diagnosis not present

## 2019-06-11 DIAGNOSIS — Z880 Allergy status to penicillin: Secondary | ICD-10-CM | POA: Diagnosis not present

## 2019-06-11 DIAGNOSIS — F329 Major depressive disorder, single episode, unspecified: Secondary | ICD-10-CM | POA: Diagnosis not present

## 2019-06-11 DIAGNOSIS — Z885 Allergy status to narcotic agent status: Secondary | ICD-10-CM | POA: Diagnosis not present

## 2019-06-11 DIAGNOSIS — Z7984 Long term (current) use of oral hypoglycemic drugs: Secondary | ICD-10-CM | POA: Diagnosis not present

## 2019-06-11 DIAGNOSIS — Z882 Allergy status to sulfonamides status: Secondary | ICD-10-CM | POA: Diagnosis not present

## 2019-06-11 DIAGNOSIS — R300 Dysuria: Secondary | ICD-10-CM | POA: Diagnosis not present

## 2019-06-11 DIAGNOSIS — Z87891 Personal history of nicotine dependence: Secondary | ICD-10-CM | POA: Diagnosis not present

## 2019-06-11 DIAGNOSIS — L409 Psoriasis, unspecified: Secondary | ICD-10-CM | POA: Diagnosis not present

## 2019-06-11 DIAGNOSIS — E119 Type 2 diabetes mellitus without complications: Secondary | ICD-10-CM | POA: Diagnosis not present

## 2019-06-11 DIAGNOSIS — Z7902 Long term (current) use of antithrombotics/antiplatelets: Secondary | ICD-10-CM | POA: Diagnosis not present

## 2019-06-11 DIAGNOSIS — K219 Gastro-esophageal reflux disease without esophagitis: Secondary | ICD-10-CM | POA: Diagnosis not present

## 2019-06-11 DIAGNOSIS — Z79899 Other long term (current) drug therapy: Secondary | ICD-10-CM | POA: Diagnosis not present

## 2019-06-11 DIAGNOSIS — Z886 Allergy status to analgesic agent status: Secondary | ICD-10-CM | POA: Diagnosis not present

## 2019-06-11 DIAGNOSIS — I10 Essential (primary) hypertension: Secondary | ICD-10-CM | POA: Diagnosis not present

## 2019-06-11 DIAGNOSIS — B372 Candidiasis of skin and nail: Secondary | ICD-10-CM | POA: Diagnosis not present

## 2019-06-11 DIAGNOSIS — Z888 Allergy status to other drugs, medicaments and biological substances status: Secondary | ICD-10-CM | POA: Diagnosis not present

## 2019-06-18 ENCOUNTER — Other Ambulatory Visit: Payer: Self-pay | Admitting: Family Medicine

## 2019-06-18 DIAGNOSIS — IMO0002 Reserved for concepts with insufficient information to code with codable children: Secondary | ICD-10-CM

## 2019-06-18 DIAGNOSIS — E1142 Type 2 diabetes mellitus with diabetic polyneuropathy: Secondary | ICD-10-CM

## 2019-06-26 ENCOUNTER — Ambulatory Visit: Payer: PPO | Admitting: Family Medicine

## 2019-07-03 ENCOUNTER — Other Ambulatory Visit: Payer: Self-pay

## 2019-07-03 NOTE — Telephone Encounter (Signed)
Refill request for general medication. BD ultra fine needles  Last office visit 03/20/2019  No follow-ups on file.

## 2019-07-03 NOTE — Telephone Encounter (Signed)
Spoke with pt and she said she is no longer a pt here due to non of the providers here being in her network and not she has a balance.

## 2019-07-11 ENCOUNTER — Ambulatory Visit: Payer: Self-pay

## 2019-07-17 ENCOUNTER — Other Ambulatory Visit: Payer: Self-pay | Admitting: Family Medicine

## 2019-07-17 DIAGNOSIS — J3089 Other allergic rhinitis: Secondary | ICD-10-CM

## 2019-07-21 ENCOUNTER — Ambulatory Visit
Admission: RE | Admit: 2019-07-21 | Discharge: 2019-07-21 | Disposition: A | Discharge status: Home / Self Care | Payer: PPO | Source: Ambulatory Visit | Attending: Family Medicine | Admitting: Family Medicine

## 2019-07-21 DIAGNOSIS — Z1239 Encounter for other screening for malignant neoplasm of breast: Secondary | ICD-10-CM | POA: Diagnosis present

## 2019-07-21 DIAGNOSIS — Z1231 Encounter for screening mammogram for malignant neoplasm of breast: Secondary | ICD-10-CM | POA: Diagnosis not present

## 2019-08-02 DIAGNOSIS — E559 Vitamin D deficiency, unspecified: Secondary | ICD-10-CM | POA: Diagnosis not present

## 2019-08-02 DIAGNOSIS — E1140 Type 2 diabetes mellitus with diabetic neuropathy, unspecified: Secondary | ICD-10-CM | POA: Diagnosis not present

## 2019-08-02 DIAGNOSIS — E118 Type 2 diabetes mellitus with unspecified complications: Secondary | ICD-10-CM | POA: Diagnosis not present

## 2019-08-02 DIAGNOSIS — K219 Gastro-esophageal reflux disease without esophagitis: Secondary | ICD-10-CM | POA: Diagnosis not present

## 2019-08-02 DIAGNOSIS — M109 Gout, unspecified: Secondary | ICD-10-CM | POA: Diagnosis not present

## 2019-08-02 DIAGNOSIS — R7989 Other specified abnormal findings of blood chemistry: Secondary | ICD-10-CM | POA: Diagnosis not present

## 2019-08-02 DIAGNOSIS — E7800 Pure hypercholesterolemia, unspecified: Secondary | ICD-10-CM | POA: Diagnosis not present

## 2019-08-02 DIAGNOSIS — Z79899 Other long term (current) drug therapy: Secondary | ICD-10-CM | POA: Diagnosis not present

## 2019-08-02 DIAGNOSIS — E119 Type 2 diabetes mellitus without complications: Secondary | ICD-10-CM | POA: Diagnosis not present

## 2019-08-02 DIAGNOSIS — Z8249 Family history of ischemic heart disease and other diseases of the circulatory system: Secondary | ICD-10-CM | POA: Diagnosis not present

## 2019-08-02 DIAGNOSIS — I2510 Atherosclerotic heart disease of native coronary artery without angina pectoris: Secondary | ICD-10-CM | POA: Diagnosis not present

## 2019-08-02 DIAGNOSIS — F419 Anxiety disorder, unspecified: Secondary | ICD-10-CM | POA: Diagnosis not present

## 2019-08-02 DIAGNOSIS — E785 Hyperlipidemia, unspecified: Secondary | ICD-10-CM | POA: Diagnosis not present

## 2019-08-02 DIAGNOSIS — R5383 Other fatigue: Secondary | ICD-10-CM | POA: Diagnosis not present

## 2019-08-02 DIAGNOSIS — I10 Essential (primary) hypertension: Secondary | ICD-10-CM | POA: Diagnosis not present

## 2019-08-02 DIAGNOSIS — R5381 Other malaise: Secondary | ICD-10-CM | POA: Diagnosis not present

## 2019-08-07 DIAGNOSIS — R1110 Vomiting, unspecified: Secondary | ICD-10-CM | POA: Diagnosis not present

## 2019-08-07 DIAGNOSIS — R109 Unspecified abdominal pain: Secondary | ICD-10-CM | POA: Diagnosis not present

## 2019-08-07 DIAGNOSIS — R519 Headache, unspecified: Secondary | ICD-10-CM | POA: Diagnosis not present

## 2019-08-07 DIAGNOSIS — R197 Diarrhea, unspecified: Secondary | ICD-10-CM | POA: Diagnosis not present

## 2019-09-19 ENCOUNTER — Other Ambulatory Visit: Payer: Self-pay | Admitting: Family Medicine

## 2019-09-19 DIAGNOSIS — IMO0002 Reserved for concepts with insufficient information to code with codable children: Secondary | ICD-10-CM

## 2019-09-19 DIAGNOSIS — E1142 Type 2 diabetes mellitus with diabetic polyneuropathy: Secondary | ICD-10-CM

## 2019-09-19 NOTE — Telephone Encounter (Signed)
Requested medication (s) are due for refill today:   Yes  Requested medication (s) are on the active medication list:   Yes  Future visit scheduled:   No   She has a new dr.   Last ordered: 06/18/2019  #90 0 refills  Clinic note:   I called her to make an appt for her refills however she informed me she has a new doctor.   She will have them call and request her records from your office so I did not renew the losartan.   Requested Prescriptions  Pending Prescriptions Disp Refills   losartan (COZAAR) 25 MG tablet [Pharmacy Med Name: LOSARTAN 25MG  TABLETS] 90 tablet 0    Sig: TAKE 1 TABLET(25 MG) BY MOUTH DAILY      Cardiovascular:  Angiotensin Receptor Blockers Failed - 09/19/2019 11:10 AM      Failed - Cr in normal range and within 180 days    Creat  Date Value Ref Range Status  04/06/2017 0.97 (H) 0.60 - 0.93 mg/dL Final    Comment:    For patients >80 years of age, the reference limit for Creatinine is approximately 13% higher for people identified as African-American. .    Creatinine, Ser  Date Value Ref Range Status  02/09/2019 1.04 (H) 0.57 - 1.00 mg/dL Final   Creatinine, POC  Date Value Ref Range Status  04/29/2016 NEG mg/dL Final          Failed - K in normal range and within 180 days    Potassium  Date Value Ref Range Status  02/09/2019 3.9 3.5 - 5.2 mmol/L Final  08/01/2013 4.1 3.5 - 5.1 mmol/L Final          Failed - Valid encounter within last 6 months    Recent Outpatient Visits           6 months ago Uncontrolled type 2 diabetes mellitus with peripheral neuropathy [facility])   [facility] Delsa Grana, PA-C   7 months ago Calcification of abdominal aorta [facility])   [place], NP   9 months ago Uncontrolled type 2 diabetes mellitus with peripheral neuropathy [facility])   [place], NP   10 months ago Calcification of abdominal aorta [facility])   [place], NP   1 year ago [facility]   [place], NP              Passed - Patient is not pregnant      Passed - Last BP in normal range    BP Readings from Last 1 Encounters:  02/15/19 134/74

## 2019-10-24 ENCOUNTER — Other Ambulatory Visit: Payer: Self-pay

## 2019-10-24 ENCOUNTER — Encounter: Payer: Self-pay | Admitting: *Deleted

## 2019-10-24 DIAGNOSIS — Z8673 Personal history of transient ischemic attack (TIA), and cerebral infarction without residual deficits: Secondary | ICD-10-CM | POA: Diagnosis not present

## 2019-10-24 DIAGNOSIS — E039 Hypothyroidism, unspecified: Secondary | ICD-10-CM | POA: Insufficient documentation

## 2019-10-24 DIAGNOSIS — Z794 Long term (current) use of insulin: Secondary | ICD-10-CM | POA: Diagnosis not present

## 2019-10-24 DIAGNOSIS — Z79899 Other long term (current) drug therapy: Secondary | ICD-10-CM | POA: Insufficient documentation

## 2019-10-24 DIAGNOSIS — I10 Essential (primary) hypertension: Secondary | ICD-10-CM | POA: Diagnosis not present

## 2019-10-24 DIAGNOSIS — E119 Type 2 diabetes mellitus without complications: Secondary | ICD-10-CM | POA: Insufficient documentation

## 2019-10-24 DIAGNOSIS — R109 Unspecified abdominal pain: Secondary | ICD-10-CM | POA: Diagnosis present

## 2019-10-24 DIAGNOSIS — A084 Viral intestinal infection, unspecified: Secondary | ICD-10-CM | POA: Insufficient documentation

## 2019-10-24 LAB — CBC
HCT: 42.2 % (ref 36.0–46.0)
Hemoglobin: 14.3 g/dL (ref 12.0–15.0)
MCH: 30.3 pg (ref 26.0–34.0)
MCHC: 33.9 g/dL (ref 30.0–36.0)
MCV: 89.4 fL (ref 80.0–100.0)
Platelets: 218 10*3/uL (ref 150–400)
RBC: 4.72 MIL/uL (ref 3.87–5.11)
RDW: 12.3 % (ref 11.5–15.5)
WBC: 13.1 10*3/uL — ABNORMAL HIGH (ref 4.0–10.5)
nRBC: 0 % (ref 0.0–0.2)

## 2019-10-24 LAB — COMPREHENSIVE METABOLIC PANEL
ALT: 27 U/L (ref 0–44)
AST: 36 U/L (ref 15–41)
Albumin: 4.6 g/dL (ref 3.5–5.0)
Alkaline Phosphatase: 51 U/L (ref 38–126)
Anion gap: 12 (ref 5–15)
BUN: 25 mg/dL — ABNORMAL HIGH (ref 8–23)
CO2: 24 mmol/L (ref 22–32)
Calcium: 11.1 mg/dL — ABNORMAL HIGH (ref 8.9–10.3)
Chloride: 105 mmol/L (ref 98–111)
Creatinine, Ser: 1.51 mg/dL — ABNORMAL HIGH (ref 0.44–1.00)
GFR calc Af Amer: 39 mL/min — ABNORMAL LOW (ref >60)
GFR calc non Af Amer: 34 mL/min — ABNORMAL LOW (ref >60)
Glucose, Bld: 221 mg/dL — ABNORMAL HIGH (ref 70–99)
Potassium: 5.1 mmol/L (ref 3.5–5.1)
Sodium: 141 mmol/L (ref 135–145)
Total Bilirubin: 0.9 mg/dL (ref 0.3–1.2)
Total Protein: 8.9 g/dL — ABNORMAL HIGH (ref 6.5–8.1)

## 2019-10-24 LAB — TROPONIN I (HIGH SENSITIVITY): Troponin I (High Sensitivity): 7 ng/L (ref <18)

## 2019-10-24 LAB — LIPASE, BLOOD: Lipase: 27 U/L (ref 11–51)

## 2019-10-24 MED ORDER — SODIUM CHLORIDE 0.9% FLUSH
3.0000 mL | Freq: Once | INTRAVENOUS | Status: AC
  Administered 2019-10-25: 3 mL via INTRAVENOUS

## 2019-10-25 ENCOUNTER — Emergency Department
Admission: EM | Admit: 2019-10-25 | Discharge: 2019-10-25 | Disposition: A | Discharge status: Home / Self Care | Payer: Medicare HMO | Attending: Emergency Medicine | Admitting: Emergency Medicine

## 2019-10-25 ENCOUNTER — Emergency Department: Payer: Medicare HMO

## 2019-10-25 DIAGNOSIS — A084 Viral intestinal infection, unspecified: Secondary | ICD-10-CM

## 2019-10-25 LAB — URINALYSIS, COMPLETE (UACMP) WITH MICROSCOPIC
Bacteria, UA: NONE SEEN
Bilirubin Urine: NEGATIVE
Glucose, UA: NEGATIVE mg/dL
Hgb urine dipstick: NEGATIVE
Ketones, ur: NEGATIVE mg/dL
Leukocytes,Ua: NEGATIVE
Nitrite: NEGATIVE
Protein, ur: NEGATIVE mg/dL
Specific Gravity, Urine: 1.02 (ref 1.005–1.030)
pH: 5 (ref 5.0–8.0)

## 2019-10-25 LAB — TROPONIN I (HIGH SENSITIVITY): Troponin I (High Sensitivity): 9 ng/L (ref <18)

## 2019-10-25 MED ORDER — FAMOTIDINE IN NACL 20-0.9 MG/50ML-% IV SOLN
20.0000 mg | Freq: Once | INTRAVENOUS | Status: AC
  Administered 2019-10-25: 20 mg via INTRAVENOUS
  Filled 2019-10-25: qty 50

## 2019-10-25 MED ORDER — ONDANSETRON HCL 4 MG/2ML IJ SOLN
4.0000 mg | Freq: Once | INTRAMUSCULAR | Status: AC
  Administered 2019-10-25: 4 mg via INTRAVENOUS
  Filled 2019-10-25: qty 2

## 2019-10-25 MED ORDER — ONDANSETRON 4 MG PO TBDP: 4.0000 mg | ORAL_TABLET | Freq: Three times a day (TID) | ORAL | 0 refills | Status: AC | PRN

## 2019-10-25 MED ORDER — LACTATED RINGERS IV BOLUS
1000.0000 mL | Freq: Once | INTRAVENOUS | Status: AC
  Administered 2019-10-25: 1000 mL via INTRAVENOUS

## 2019-10-25 NOTE — ED Notes (Signed)
Pt to toilet to provide urine sample

## 2019-10-25 NOTE — ED Notes (Signed)
States she last urinated approx 15 minutes ago and is unable to urinate at this time

## 2019-10-25 NOTE — ED Notes (Signed)
Pt states she still does not believe she has a need to urinate at this time. Will continue to monitor.

## 2019-10-25 NOTE — ED Provider Notes (Signed)
[facility] Emergency Department Provider Note  ____________________________________________  Time seen: Approximately 4:16 AM  I have reviewed the triage vital signs and the nursing notes.   HISTORY  Chief Complaint Emesis, Abdominal Pain, and Diarrhea   HPI Charlotte Herrera is a 74 y.o. female with a history of Nash cirrhosis, diabetes, hypertension, hyperlipidemia, hypothyroidism, stroke, fibromyalgia, arthritis who presents for evaluation of nausea, vomiting, diarrhea.  Patient reports that symptoms started yesterday morning.  She reports 6-8 episodes of nonbloody nonbilious emesis and 10-12 episodes of watery diarrhea.  No hematemesis, melena, coffee-ground emesis.  Initially she had abdominal pain while vomiting however that has resolved.  She describes the pain as epigastric and pressure in nature.  She denies any history of heart disease.  She is on Plavix for a TIA/stroke.  Currently she denies any abdominal pain.  No chest pain, no cough, no shortness of breath, no fever, no sore throat, no loss of taste or smell.  Patient has been vaccinated for Covid.  She reports that she lives alone and has had no exposures.  Past Medical History:  Diagnosis Date  . Allergy   . Arthritis    hands, shoulders, knees, feet  . Calcification of abdominal aorta (HCC) 09/03/2017   CT scan [facility] Feb 2019  . Cirrhosis ([place]) 09/03/2017   Chest CT [facility] Feb 2019  . Depression   . Diabetes mellitus, type 2 ([place])   . Enlarged liver   . Family history of adverse reaction to anesthesia    Mother - PONV  . Fibromyalgia   . Fibromyalgia affecting multiple sites    pinched nerve in back of neck  . GERD (gastroesophageal reflux disease)   . Headache   . Hyperlipidemia   . Hypertension   . Hypothyroidism   . Motion sickness   . PONV (postoperative nausea and vomiting)   . Stroke ([place])   . Thyroid disease   . TIA (transient ischemic attack)   . Vertigo    after cataract surgery     Patient Active Problem List   Diagnosis Date Noted  . Diabetic neuropathy ([place]) 12/29/2018  . Hammer toe of second toe of right foot 12/29/2018  . Chronic arthropathy 12/29/2018  . Gastritis without bleeding   . Carotid stenosis 01/04/2018  . 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 Anxiety, generalized 09/03/2017  . MDD (major depressive disorder) 09/03/2017  . Calcification of abdominal aorta (HCC) 09/03/2017  . Cirrhosis ([place]) 09/03/2017  . Hx of transient ischemic attack (TIA) 07/06/2017  . H/O carotid endarterectomy 05/28/2017  . Plantar fasciitis 11/27/2016  . Transaminitis 03/11/2016  . Environmental and seasonal allergies 12/05/2015  . Lipoma of abdominal wall 12/05/2015  . Osteoarthritis of knee 08/05/2015  . Chronic gout involving toe of right foot without tophus 05/28/2015  . Hypothyroidism 05/14/2015  . Impingement syndrome of shoulder region 01/17/2015  . Vitamin B12 deficiency 12/25/2014  . Vitamin D deficiency 12/25/2014  . Uncontrolled type 2 diabetes mellitus with peripheral neuropathy ([place]) 12/25/2014  . Hx of transient ischemic attack (TIA) 12/25/2014  . Abnormal liver enzymes 12/25/2014    Past Surgical History:  Procedure Laterality Date  . ABDOMINAL HYSTERECTOMY    . CARPAL TUNNEL RELEASE Left   . COLONOSCOPY  2015  . COLONOSCOPY WITH PROPOFOL N/A 11/05/2017   Procedure: COLONOSCOPY WITH PROPOFOL;  Surgeon: Lucilla Lame, MD;  Location: [place];  Service: Endoscopy;  Laterality: N/A;  Diabetic  .  COLONOSCOPY WITH PROPOFOL N/A 02/25/2018   Procedure: COLONOSCOPY WITH PROPOFOL;  Surgeon: Lucilla Lame, MD;  Location: [place];  Service: Endoscopy;  Laterality: N/A;  . ESOPHAGOGASTRODUODENOSCOPY (EGD) WITH PROPOFOL N/A 11/05/2017   Procedure: ESOPHAGOGASTRODUODENOSCOPY (EGD) WITH PROPOFOL;  Surgeon: Lucilla Lame, MD;  Location: [place];  Service: Endoscopy;  Laterality: N/A;  .  ESOPHAGOGASTRODUODENOSCOPY (EGD) WITH PROPOFOL N/A 02/25/2018   Procedure: ESOPHAGOGASTRODUODENOSCOPY (EGD) WITH PROPOFOL;  Surgeon: Lucilla Lame, MD;  Location: [place];  Service: Endoscopy;  Laterality: N/A;  diabetic - insulin and oral meds  . SPINE SURGERY    . VARICOSE VEIN SURGERY Left 1975    Prior to Admission medications   Medication Sig Start Date End Date Taking? Authorizing Provider  fluticasone (FLONASE) 50 MCG/ACT nasal spray SHAKE LIQUID AND USE 2 SPRAYS IN EACH NOSTRIL DAILY 07/17/19   Delsa Grana, PA-C  Blood Glucose Monitoring Suppl (ONE TOUCH ULTRA MINI) w/Device KIT 1 Device by Does not apply route 2 (two) times daily. 09/08/18   Poulose, Bethel Born, NP  buPROPion (WELLBUTRIN XL) 150 MG 24 hr tablet Take 150 mg by mouth daily.    Arnetha Courser, MD  Cholecalciferol (VITAMIN D-3) 25 MCG (1000 UT) CAPS Take by mouth.    [provider]  clopidogrel (PLAVIX) 75 MG tablet Take 1 tablet (75 mg total) by mouth daily. 07/27/18   Lada, Satira Anis, MD  Continuous Blood Gluc Sensor (DEXCOM G6 SENSOR) MISC 1 Device by Does not apply route every 14 (fourteen) days. 02/08/19   Poulose, Bethel Born, NP  Cyanocobalamin (VITAMIN B 12 PO) Take by mouth.    [provider]  diclofenac sodium (VOLTAREN) 1 % GEL Apply 2 g topically 2 (two) times daily as needed. 11/14/18   Poulose, Bethel Born, NP  gabapentin (NEURONTIN) 300 MG capsule TAKE 1 CAPSULE(300 MG) BY MOUTH AT BEDTIME 05/02/19   Hubbard Hartshorn, FNP  glucose blood (ONE TOUCH ULTRA TEST) test strip TEST twice a day 09/08/18   Fredderick Severance, NP  hydrOXYzine (VISTARIL) 25 MG capsule Take 1 capsule (25 mg total) by mouth daily as needed for anxiety. 02/08/19   Poulose, Bethel Born, NP  Insulin Degludec-Liraglutide (XULTOPHY) 100-3.6 UNIT-MG/ML SOPN Inject 40 Units into the skin daily. 03/20/19   Delsa Grana, PA-C  Insulin Glargine (LANTUS SOLOSTAR) 100 UNIT/ML Solostar Pen Inject 40 Units into the skin at  bedtime. Patient not taking: Reported on 02/15/2019 08/04/18   Arnetha Courser, MD  Insulin Pen Needle (BD PEN NEEDLE MICRO U/F) 32G X 6 MM MISC USE AS DIRECTED once AT BEDTIME for insulin administration DX:E11.42 Lon:99 months 07/07/18   Arnetha Courser, MD  ketoconazole (NIZORAL) 2 % cream APPLY TOPICALLY TO THE AFFECTED AREA DAILY AS NEEDED FOR IRRITATION( WHERE NEEDED) 09/26/18   Fredderick Severance, NP  levothyroxine (SYNTHROID) 50 MCG tablet Take 1 tablet (50 mcg total) by mouth daily before breakfast. 05/09/19   Delsa Grana, PA-C  losartan (COZAAR) 25 MG tablet TAKE 1 TABLET(25 MG) BY MOUTH DAILY 10/06/19   Delsa Grana, PA-C  metFORMIN (GLUCOPHAGE) 500 MG tablet TAKE 1 TO 2 TABLETS BY MOUTH TWICE DAILY WITH MEALS. MAXIMUM DAILY DOSE IS 3 TABLETS 03/20/19   Ancil Boozer, Drue Stager, MD  ondansetron (ZOFRAN ODT) 4 MG disintegrating tablet Take 1 tablet (4 mg total) by mouth every 8 (eight) hours as needed. 10/25/19   Rudene Re, MD  OneTouch Delica Lancets 79X MISC 33 g by Does not apply route 2 (two) times  daily. Test twice a day as directed 09/08/18   Fredderick Severance, NP  pantoprazole (PROTONIX) 20 MG tablet Take 1 tablet (20 mg total) by mouth daily as needed for heartburn or indigestion. 04/19/19   Delsa Grana, PA-C  pravastatin (PRAVACHOL) 20 MG tablet Take 1 tablet (20 mg total) by mouth at bedtime. For cholesterol 11/01/18   Poulose, Bethel Born, NP  traZODone (DESYREL) 50 MG tablet TAKE 1/2 TO 1 TABLET(25 TO 50 MG) BY MOUTH AT BEDTIME AS NEEDED FOR SLEEP Patient not taking: Reported on 02/08/2019 02/07/19   Fredderick Severance, NP  losartan (COZAAR) 25 MG tablet TAKE 1 TABLET(25 MG) BY MOUTH DAILY 06/18/19   Steele Sizer, MD    Allergies Aspirin, Duloxetine hcl, Penicillins, Codeine, Lyrica [pregabalin], and Sulfa antibiotics  Family History  Problem Relation Age of Onset  . Cancer Mother   . Ovarian cancer Mother   . Diabetes Father   . Cancer Father   . Prostate cancer Father    . Heart disease Father   . Diabetes Brother   . Cancer Brother   . Breast cancer Maternal Aunt        40's  . Heart disease Daughter   . Healthy Daughter   . Healthy Daughter   . Ovarian cancer Maternal Aunt   . Liver cancer Maternal Grandmother   . Heart failure Maternal Grandfather     Social History Social History   Tobacco Use  . Smoking status: Never Smoker  . Smokeless tobacco: Never Used  . Tobacco comment: smoking cessation materials not required  Substance Use Topics  . Alcohol use: No  . Drug use: No    Review of Systems  Constitutional: Negative for fever. Eyes: Negative for visual changes. ENT: Negative for sore throat. Neck: No neck pain  Cardiovascular: Negative for chest pain. Respiratory: Negative for shortness of breath. Gastrointestinal: + abdominal pain, vomiting and diarrhea. Genitourinary: Negative for dysuria. Musculoskeletal: Negative for back pain. Skin: Negative for rash. Neurological: Negative for headaches, weakness or numbness. Psych: No SI or HI  ____________________________________________   PHYSICAL EXAM:  VITAL SIGNS: ED Triage Vitals  Enc Vitals Group     BP 10/24/19 2254 123/84     Pulse Rate 10/24/19 2254 (!) 110     Resp 10/24/19 2254 20     Temp 10/24/19 2254 99.3 F (37.4 C)     Temp Source 10/24/19 2254 Oral     SpO2 10/24/19 2254 95 %     Weight 10/24/19 2255 162 lb (73.5 kg)     Height 10/24/19 2255 5' 3"  (1.6 m)     Head Circumference --      Peak Flow --      Pain Score 10/24/19 2255 5     Pain Loc --      Pain Edu? --      Excl. in [place]? --     Constitutional: Alert and oriented. Well appearing and in no apparent distress. HEENT:      Head: Normocephalic and atraumatic.         Eyes: Conjunctivae are normal. Sclera is non-icteric.       Mouth/Throat: Mucous membranes are dry.       Neck: Supple with no signs of meningismus. Cardiovascular: Tachycardic with regular rhythm. Respiratory: Normal respiratory  effort. Lungs are clear to auscultation bilaterally. No wheezes, crackles, or rhonchi. Gastrointestinal: Soft, non tender, and non distended with positive bowel sounds. No rebound or guarding. Musculoskeletal: No edema, cyanosis, or  erythema of extremities. Neurologic: Normal speech and language. Face is symmetric. Moving all extremities. No gross focal neurologic deficits are appreciated. Skin: Skin is warm, dry and intact. No rash noted. Psychiatric: Mood and affect are normal. Speech and behavior are normal.  ____________________________________________   LABS (all labs ordered are listed, but only abnormal results are displayed)  Labs Reviewed  COMPREHENSIVE METABOLIC PANEL - Abnormal; Notable for the following components:      Result Value   Glucose, Bld 221 (*)    BUN 25 (*)    Creatinine, Ser 1.51 (*)    Calcium 11.1 (*)    Total Protein 8.9 (*)    GFR calc non Af Amer 34 (*)    GFR calc Af Amer 39 (*)    All other components within normal limits  CBC - Abnormal; Notable for the following components:   WBC 13.1 (*)    All other components within normal limits  URINALYSIS, COMPLETE (UACMP) WITH MICROSCOPIC - Abnormal; Notable for the following components:   Color, Urine YELLOW (*)    APPearance HAZY (*)    All other components within normal limits  LIPASE, BLOOD  TROPONIN I (HIGH SENSITIVITY)  TROPONIN I (HIGH SENSITIVITY)   ____________________________________________  EKG  ED ECG REPORT I, Rudene Re, the attending physician, personally viewed and interpreted this ECG.  Sinus tachycardia with a rate of 108, normal intervals, normal axis, anterior and inferior Q waves and no ST elevations or depressions.  Unchanged from prior. ____________________________________________  RADIOLOGY  I have personally reviewed the images performed during this visit and I agree with the Radiologist's read.   Interpretation by Radiologist:  DG Abdomen Acute W/Chest   Result Date: 10/25/2019 CLINICAL DATA:  Nausea and vomiting with epigastric pain EXAM: DG ABDOMEN ACUTE W/ 1V CHEST COMPARISON:  None. FINDINGS: There is no evidence of dilated bowel loops or free intraperitoneal air. No radiopaque calculi or other significant radiographic abnormality is seen. Heart size and mediastinal contours are within normal limits. Both lungs are clear. IMPRESSION: Negative abdominal radiographs.  No acute cardiopulmonary disease. Electronically Signed   By: Monte Fantasia M.D.   On: 10/25/2019 04:05     ____________________________________________   PROCEDURES  Procedure(s) performed:yes .1-3 Lead EKG Interpretation Performed by: Rudene Re, MD Authorized by: Rudene Re, MD     Interpretation: normal     ECG rate assessment: tachycardic     Rhythm: sinus tachycardia     Ectopy: none     Conduction: normal     Critical Care performed:  None ____________________________________________   INITIAL IMPRESSION / ASSESSMENT AND PLAN / ED COURSE   74 y.o. female with a history of [place] cirrhosis, diabetes, hypertension, hyperlipidemia, hypothyroidism, stroke, fibromyalgia, arthritis who presents for evaluation of nausea, vomiting, diarrhea.   Patient is well-appearing but looks dry on exam, she is tachycardic with a pulse of 110, afebrile and normotensive.  Normal work of breathing and sats, lungs are clear to auscultation, abdomen is soft with no tenderness and positive bowel sounds.  Differential diagnosis including viral gastroenteritis versus colitis versus enteritis versus C. Difficile.  Low suspicion for Covid none fully vaccinated patient with no fever or respiratory symptoms.  Old medical records have been reviewed.  Patient placed on telemetry for cardiac monitoring.  Patient initially complaining of epigastric pain therefore an EKG was done which shows no evidence of ischemia.  Troponin x2 -.  Labs showing acute on chronic kidney injury  with a creatinine of 1.51 (baseline is  1), hyperglycemia no evidence of DKA with sugar of 221, hypercalcemia with calcium of 11.1 consistent with significant dehydration.  Sodium and potassium are normal.  Lipase and LFTs are normal.  CBC showed leukocytosis with white count 13.1 most likely consistent with an infectious etiology.  We will give IV fluids, Zofran and reassess.  _________________________ 6:44 AM on 10/25/2019 -----------------------------------------  Patient reassessment: Feeling markedly improved with full resolution of her symptoms, tolerating p.o. with no further episodes of nausea or vomiting.  No diarrhea in the emergency room.  Presentation concerning most likely for viral gastroenteritis.  Recommended bland diet, increase oral hydration, Zofran for nausea.  Discussed close follow-up with PCP in a couple of days for repeat blood work.  Discussed my standard return precautions.     _____________________________________________ Please note:  Patient was evaluated in Emergency Department today for the symptoms described in the history of present illness. Patient was evaluated in the context of the global COVID-19 pandemic, which necessitated consideration that the patient might be at risk for infection with the SARS-CoV-2 virus that causes COVID-19. Institutional protocols and algorithms that pertain to the evaluation of patients at risk for COVID-19 are in a state of rapid change based on information released by regulatory bodies including the CDC and federal and state organizations. These policies and algorithms were followed during the patient's care in the ED.  Some ED evaluations and interventions may be delayed as a result of limited staffing during the pandemic.   [place] Controlled Substance Database was reviewed by me. ____________________________________________   FINAL CLINICAL IMPRESSION(S) / ED DIAGNOSES   Final diagnoses:  Viral gastroenteritis      NEW  MEDICATIONS STARTED DURING THIS VISIT:  ED Discharge Orders         Ordered    ondansetron (ZOFRAN ODT) 4 MG disintegrating tablet  Every 8 hours PRN     10/25/19 0618           Note:  This document was prepared using Dragon voice recognition software and may include unintentional dictation errors.    Alfred Levins, [place], MD 10/25/19 [phone]

## 2019-10-25 NOTE — ED Notes (Addendum)
Pt provided warm blanket, states she cannot urinate at this time

## 2019-10-25 NOTE — ED Notes (Signed)
Pt to xray

## 2019-10-25 NOTE — ED Notes (Signed)
Urine sent to lab at this time.

## 2019-10-25 NOTE — ED Notes (Signed)
Pt states she woke up at approx 11:30AM yesterday and began to experience vomiting episodes and diarrhea. Pt states the last time she vomited was 8:30PM and she experienced a total of 4 episodes. States she has not had diarrhea since same and experienced approx 10-12 episodes. States she did choke herself once with vomiting and since has had chest pressure. Reports not taking her night time meds and has had little intake throughout day.

## 2020-02-20 ENCOUNTER — Ambulatory Visit (INDEPENDENT_AMBULATORY_CARE_PROVIDER_SITE_OTHER): Payer: PPO | Admitting: Vascular Surgery

## 2020-02-20 ENCOUNTER — Encounter (INDEPENDENT_AMBULATORY_CARE_PROVIDER_SITE_OTHER): Payer: PPO

## 2020-06-14 ENCOUNTER — Other Ambulatory Visit: Payer: Self-pay | Admitting: Family Medicine

## 2020-06-14 DIAGNOSIS — Z1231 Encounter for screening mammogram for malignant neoplasm of breast: Secondary | ICD-10-CM

## 2020-09-13 ENCOUNTER — Other Ambulatory Visit: Payer: Self-pay

## 2020-09-13 ENCOUNTER — Ambulatory Visit
Admission: RE | Admit: 2020-09-13 | Discharge: 2020-09-13 | Disposition: A | Discharge status: Home / Self Care | Payer: Medicare HMO | Source: Ambulatory Visit | Attending: Family Medicine | Admitting: Family Medicine

## 2020-09-13 DIAGNOSIS — Z1231 Encounter for screening mammogram for malignant neoplasm of breast: Secondary | ICD-10-CM | POA: Diagnosis present

## 2020-10-10 ENCOUNTER — Emergency Department: Payer: HMO

## 2020-10-10 ENCOUNTER — Emergency Department
Admission: EM | Admit: 2020-10-10 | Discharge: 2020-10-10 | Disposition: A | Discharge status: Home / Self Care | Payer: HMO | Attending: Student in an Organized Health Care Education/Training Program | Admitting: Student in an Organized Health Care Education/Training Program

## 2020-10-10 ENCOUNTER — Other Ambulatory Visit: Payer: Self-pay

## 2020-10-10 DIAGNOSIS — E039 Hypothyroidism, unspecified: Secondary | ICD-10-CM | POA: Insufficient documentation

## 2020-10-10 DIAGNOSIS — R0789 Other chest pain: Secondary | ICD-10-CM | POA: Diagnosis not present

## 2020-10-10 DIAGNOSIS — Z794 Long term (current) use of insulin: Secondary | ICD-10-CM | POA: Insufficient documentation

## 2020-10-10 DIAGNOSIS — N189 Chronic kidney disease, unspecified: Secondary | ICD-10-CM | POA: Diagnosis not present

## 2020-10-10 DIAGNOSIS — E1142 Type 2 diabetes mellitus with diabetic polyneuropathy: Secondary | ICD-10-CM | POA: Diagnosis not present

## 2020-10-10 DIAGNOSIS — Z7902 Long term (current) use of antithrombotics/antiplatelets: Secondary | ICD-10-CM | POA: Diagnosis not present

## 2020-10-10 DIAGNOSIS — R079 Chest pain, unspecified: Secondary | ICD-10-CM | POA: Diagnosis present

## 2020-10-10 DIAGNOSIS — E1122 Type 2 diabetes mellitus with diabetic chronic kidney disease: Secondary | ICD-10-CM | POA: Insufficient documentation

## 2020-10-10 DIAGNOSIS — R0602 Shortness of breath: Secondary | ICD-10-CM | POA: Diagnosis not present

## 2020-10-10 DIAGNOSIS — Z7984 Long term (current) use of oral hypoglycemic drugs: Secondary | ICD-10-CM | POA: Diagnosis not present

## 2020-10-10 DIAGNOSIS — Z79899 Other long term (current) drug therapy: Secondary | ICD-10-CM | POA: Insufficient documentation

## 2020-10-10 DIAGNOSIS — I129 Hypertensive chronic kidney disease with stage 1 through stage 4 chronic kidney disease, or unspecified chronic kidney disease: Secondary | ICD-10-CM | POA: Insufficient documentation

## 2020-10-10 LAB — BASIC METABOLIC PANEL
Anion gap: 10 (ref 5–15)
BUN: 25 mg/dL — ABNORMAL HIGH (ref 8–23)
CO2: 24 mmol/L (ref 22–32)
Calcium: 9.4 mg/dL (ref 8.9–10.3)
Chloride: 105 mmol/L (ref 98–111)
Creatinine, Ser: 1.5 mg/dL — ABNORMAL HIGH (ref 0.44–1.00)
GFR, Estimated: 36 mL/min — ABNORMAL LOW (ref >60)
Glucose, Bld: 181 mg/dL — ABNORMAL HIGH (ref 70–99)
Potassium: 4.1 mmol/L (ref 3.5–5.1)
Sodium: 139 mmol/L (ref 135–145)

## 2020-10-10 LAB — PROTIME-INR
INR: 1.1 (ref 0.8–1.2)
Prothrombin Time: 13.9 seconds (ref 11.4–15.2)

## 2020-10-10 LAB — CBC
HCT: 38.4 % (ref 36.0–46.0)
Hemoglobin: 12.9 g/dL (ref 12.0–15.0)
MCH: 29.5 pg (ref 26.0–34.0)
MCHC: 33.6 g/dL (ref 30.0–36.0)
MCV: 87.9 fL (ref 80.0–100.0)
Platelets: 150 10*3/uL (ref 150–400)
RBC: 4.37 MIL/uL (ref 3.87–5.11)
RDW: 12.3 % (ref 11.5–15.5)
WBC: 5.1 10*3/uL (ref 4.0–10.5)
nRBC: 0 % (ref 0.0–0.2)

## 2020-10-10 LAB — URINALYSIS, COMPLETE (UACMP) WITH MICROSCOPIC
Bacteria, UA: NONE SEEN
Bilirubin Urine: NEGATIVE
Glucose, UA: NEGATIVE mg/dL
Hgb urine dipstick: NEGATIVE
Ketones, ur: NEGATIVE mg/dL
Leukocytes,Ua: NEGATIVE
Nitrite: NEGATIVE
Protein, ur: NEGATIVE mg/dL
Specific Gravity, Urine: 1.005 (ref 1.005–1.030)
WBC, UA: NONE SEEN WBC/hpf (ref 0–5)
pH: 6 (ref 5.0–8.0)

## 2020-10-10 LAB — D-DIMER, QUANTITATIVE: D-Dimer, Quant: 0.64 ug/mL-FEU — ABNORMAL HIGH (ref 0.00–0.50)

## 2020-10-10 LAB — HEPATIC FUNCTION PANEL
ALT: 24 U/L (ref 0–44)
AST: 34 U/L (ref 15–41)
Albumin: 3.7 g/dL (ref 3.5–5.0)
Alkaline Phosphatase: 62 U/L (ref 38–126)
Bilirubin, Direct: <0.1 mg/dL (ref 0.0–0.2)
Total Bilirubin: 0.7 mg/dL (ref 0.3–1.2)
Total Protein: 7.7 g/dL (ref 6.5–8.1)

## 2020-10-10 LAB — AMMONIA: Ammonia: 11 umol/L (ref 9–35)

## 2020-10-10 LAB — TROPONIN I (HIGH SENSITIVITY)
Troponin I (High Sensitivity): 7 ng/L (ref <18)
Troponin I (High Sensitivity): 7 ng/L (ref <18)

## 2020-10-10 MED ORDER — SODIUM CHLORIDE 0.9 % IV BOLUS
500.0000 mL | Freq: Once | INTRAVENOUS | Status: AC
  Administered 2020-10-10: 500 mL via INTRAVENOUS

## 2020-10-10 MED ORDER — IOHEXOL 350 MG/ML SOLN
60.0000 mL | Freq: Once | INTRAVENOUS | Status: AC | PRN
  Administered 2020-10-10: 60 mL via INTRAVENOUS

## 2020-10-10 NOTE — ED Provider Notes (Signed)
[facility] Emergency Department Provider Note    Event Date/Time   First MD Initiated Contact with Patient 10/10/20 1115     (approximate)  I have reviewed the triage vital signs and the nursing notes.   HISTORY  Chief Complaint Chest Pain    HPI Charlotte Herrera is a 74 y.o. female below below listed past medical history presents to the ER for evaluation of chest discomfort as well as shortness of breath starting last night.  States she had fluttering sensation in her chest as a few moments.  She thought that it was related to her blood sugar being too low.  She checked and is 90.  She ate something and called EMS.  Denies any chest pain or pressure at this time.  Denies any worsening lower extremity swelling.  Denies any orthopnea.  No pain with taking deep inspiration.    Past Medical History:  Diagnosis Date  . Allergy   . Arthritis    hands, shoulders, knees, feet  . Calcification of abdominal aorta (HCC) 09/03/2017   CT scan [facility] Feb 2019  . Cirrhosis ([place]) 09/03/2017   Chest CT [facility] Feb 2019  . Depression   . Diabetes mellitus, type 2 ([place])   . Enlarged liver   . Family history of adverse reaction to anesthesia    Mother - PONV  . Fibromyalgia   . Fibromyalgia affecting multiple sites    pinched nerve in back of neck  . GERD (gastroesophageal reflux disease)   . Headache   . Hyperlipidemia   . Hypertension   . Hypothyroidism   . Motion sickness   . PONV (postoperative nausea and vomiting)   . Stroke ([place])   . Thyroid disease   . TIA (transient ischemic attack)   . Vertigo    after cataract surgery   Family History  Problem Relation Age of Onset  . Cancer Mother   . Ovarian cancer Mother   . Diabetes Father   . Cancer Father   . Prostate cancer Father   . Heart disease Father   . Diabetes Brother   . Cancer Brother   . Breast cancer Maternal Aunt        40's  . Heart disease Daughter   . Healthy Daughter   . Healthy Daughter   .  Ovarian cancer Maternal Aunt   . Liver cancer Maternal Grandmother   . Heart failure Maternal Grandfather    Past Surgical History:  Procedure Laterality Date  . ABDOMINAL HYSTERECTOMY    . CARPAL TUNNEL RELEASE Left   . COLONOSCOPY  2015  . COLONOSCOPY WITH PROPOFOL N/A 11/05/2017   Procedure: COLONOSCOPY WITH PROPOFOL;  Surgeon: Lucilla Lame, MD;  Location: [place];  Service: Endoscopy;  Laterality: N/A;  Diabetic  . COLONOSCOPY WITH PROPOFOL N/A 02/25/2018   Procedure: COLONOSCOPY WITH PROPOFOL;  Surgeon: Lucilla Lame, MD;  Location: [place];  Service: Endoscopy;  Laterality: N/A;  . ESOPHAGOGASTRODUODENOSCOPY (EGD) WITH PROPOFOL N/A 11/05/2017   Procedure: ESOPHAGOGASTRODUODENOSCOPY (EGD) WITH PROPOFOL;  Surgeon: Lucilla Lame, MD;  Location: [place];  Service: Endoscopy;  Laterality: N/A;  . ESOPHAGOGASTRODUODENOSCOPY (EGD) WITH PROPOFOL N/A 02/25/2018   Procedure: ESOPHAGOGASTRODUODENOSCOPY (EGD) WITH PROPOFOL;  Surgeon: Lucilla Lame, MD;  Location: [place];  Service: Endoscopy;  Laterality: N/A;  diabetic - insulin and oral meds  . SPINE SURGERY    . VARICOSE VEIN SURGERY Left 1975   Patient Active Problem List   Diagnosis Date Noted  .  Diabetic neuropathy ([place]) 12/29/2018  . Hammer toe of second toe of right foot 12/29/2018  . Chronic arthropathy 12/29/2018  . Gastritis without bleeding   . Carotid stenosis 01/04/2018  . 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 Anxiety, generalized 09/03/2017  . MDD (major depressive disorder) 09/03/2017  . Calcification of abdominal aorta (HCC) 09/03/2017  . Cirrhosis ([place]) 09/03/2017  . Hx of transient ischemic attack (TIA) 07/06/2017  . H/O carotid endarterectomy 05/28/2017  . Plantar fasciitis 11/27/2016  . Transaminitis 03/11/2016  . Environmental and seasonal allergies 12/05/2015  . Lipoma of abdominal wall 12/05/2015  . Osteoarthritis of  knee 08/05/2015  . Chronic gout involving toe of right foot without tophus 05/28/2015  . Hypothyroidism 05/14/2015  . Impingement syndrome of shoulder region 01/17/2015  . Vitamin B12 deficiency 12/25/2014  . Vitamin D deficiency 12/25/2014  . Uncontrolled type 2 diabetes mellitus with peripheral neuropathy ([place]) 12/25/2014  . Hx of transient ischemic attack (TIA) 12/25/2014  . Abnormal liver enzymes 12/25/2014      Prior to Admission medications   Medication Sig Start Date End Date Taking? Authorizing Provider  fluticasone (FLONASE) 50 MCG/ACT nasal spray SHAKE LIQUID AND USE 2 SPRAYS IN EACH NOSTRIL DAILY 07/17/19   Delsa Grana, PA-C  Blood Glucose Monitoring Suppl (ONE TOUCH ULTRA MINI) w/Device KIT 1 Device by Does not apply route 2 (two) times daily. 09/08/18   Poulose, Bethel Born, NP  buPROPion (WELLBUTRIN XL) 150 MG 24 hr tablet Take 150 mg by mouth daily.    Arnetha Courser, MD  Cholecalciferol (VITAMIN D-3) 25 MCG (1000 UT) CAPS Take by mouth.    [provider]  clopidogrel (PLAVIX) 75 MG tablet Take 1 tablet (75 mg total) by mouth daily. 07/27/18   Lada, Satira Anis, MD  Continuous Blood Gluc Sensor (DEXCOM G6 SENSOR) MISC 1 Device by Does not apply route every 14 (fourteen) days. 02/08/19   Poulose, Bethel Born, NP  Cyanocobalamin (VITAMIN B 12 PO) Take by mouth.    [provider]  diclofenac sodium (VOLTAREN) 1 % GEL Apply 2 g topically 2 (two) times daily as needed. 11/14/18   Poulose, Bethel Born, NP  gabapentin (NEURONTIN) 300 MG capsule TAKE 1 CAPSULE(300 MG) BY MOUTH AT BEDTIME 05/02/19   Hubbard Hartshorn, FNP  glucose blood (ONE TOUCH ULTRA TEST) test strip TEST twice a day 09/08/18   Fredderick Severance, NP  hydrOXYzine (VISTARIL) 25 MG capsule Take 1 capsule (25 mg total) by mouth daily as needed for anxiety. 02/08/19   Poulose, Bethel Born, NP  Insulin Degludec-Liraglutide (XULTOPHY) 100-3.6 UNIT-MG/ML SOPN Inject 40 Units into the skin daily. 03/20/19    Delsa Grana, PA-C  Insulin Glargine (LANTUS SOLOSTAR) 100 UNIT/ML Solostar Pen Inject 40 Units into the skin at bedtime. Patient not taking: Reported on 02/15/2019 08/04/18   Arnetha Courser, MD  Insulin Pen Needle (BD PEN NEEDLE MICRO U/F) 32G X 6 MM MISC USE AS DIRECTED once AT BEDTIME for insulin administration DX:E11.42 Lon:99 months 07/07/18   Arnetha Courser, MD  ketoconazole (NIZORAL) 2 % cream APPLY TOPICALLY TO THE AFFECTED AREA DAILY AS NEEDED FOR IRRITATION( WHERE NEEDED) 09/26/18   Fredderick Severance, NP  levothyroxine (SYNTHROID) 50 MCG tablet Take 1 tablet (50 mcg total) by mouth daily before breakfast. 05/09/19   Delsa Grana, PA-C  losartan (COZAAR) 25 MG tablet TAKE 1 TABLET(25 MG) BY MOUTH DAILY 10/06/19   Delsa Grana, PA-C  metFORMIN (  GLUCOPHAGE) 500 MG tablet TAKE 1 TO 2 TABLETS BY MOUTH TWICE DAILY WITH MEALS. MAXIMUM DAILY DOSE IS 3 TABLETS 03/20/19   Ancil Boozer, Drue Stager, MD  ondansetron (ZOFRAN ODT) 4 MG disintegrating tablet Take 1 tablet (4 mg total) by mouth every 8 (eight) hours as needed. 10/25/19   Rudene Re, MD  OneTouch Delica Lancets 74J MISC 33 g by Does not apply route 2 (two) times daily. Test twice a day as directed 09/08/18   Fredderick Severance, NP  pantoprazole (PROTONIX) 20 MG tablet Take 1 tablet (20 mg total) by mouth daily as needed for heartburn or indigestion. 04/19/19   Delsa Grana, PA-C  pravastatin (PRAVACHOL) 20 MG tablet Take 1 tablet (20 mg total) by mouth at bedtime. For cholesterol 11/01/18   Poulose, Bethel Born, NP  traZODone (DESYREL) 50 MG tablet TAKE 1/2 TO 1 TABLET(25 TO 50 MG) BY MOUTH AT BEDTIME AS NEEDED FOR SLEEP Patient not taking: Reported on 02/08/2019 02/07/19   Fredderick Severance, NP    Allergies Aspirin, Duloxetine hcl, Penicillins, Codeine, Lyrica [pregabalin], and Sulfa antibiotics    Social History Social History   Tobacco Use  . Smoking status: Never Smoker  . Smokeless tobacco: Never Used  . Tobacco comment: smoking  cessation materials not required  Vaping Use  . Vaping Use: Never used  Substance Use Topics  . Alcohol use: No  . Drug use: No    Review of Systems Patient denies headaches, rhinorrhea, blurry vision, numbness, shortness of breath, chest pain, edema, cough, abdominal pain, nausea, vomiting, diarrhea, dysuria, fevers, rashes or hallucinations unless otherwise stated above in HPI. ____________________________________________   PHYSICAL EXAM:  VITAL SIGNS: Vitals:   10/10/20 1430 10/10/20 1445  BP:    Pulse: 76 71  Resp: 16 14  Temp:    SpO2: 98% 98%    Constitutional: Alert and oriented.  Eyes: Conjunctivae are normal.  Head: Atraumatic. Nose: No congestion/rhinnorhea. Mouth/Throat: Mucous membranes are moist.   Neck: No stridor. Painless ROM.  Cardiovascular: Normal rate, regular rhythm. Grossly normal heart sounds.  Good peripheral circulation. Respiratory: Normal respiratory effort.  No retractions. Lungs CTAB. Gastrointestinal: Soft and nontender in all four quadrants. No distention. No abdominal bruits. No CVA tenderness. Genitourinary:  Musculoskeletal: No lower extremity tenderness nor edema.  No joint effusions. Neurologic:  Normal speech and language. No gross focal neurologic deficits are appreciated. No facial droop Skin:  Skin is warm, dry and intact. No rash noted. Psychiatric: Mood and affect are normal. Speech and behavior are normal.  ____________________________________________   LABS (all labs ordered are listed, but only abnormal results are displayed)  Results for orders placed or performed during the hospital encounter of 10/10/20 (from the past 24 hour(s))  Basic metabolic panel     Status: Abnormal   Collection Time: 10/10/20 11:03 AM  Result Value Ref Range   Sodium 139 135 - 145 mmol/L   Potassium 4.1 3.5 - 5.1 mmol/L   Chloride 105 98 - 111 mmol/L   CO2 24 22 - 32 mmol/L   Glucose, Bld 181 (H) 70 - 99 mg/dL   BUN 25 (H) 8 - 23 mg/dL    Creatinine, Ser 1.50 (H) 0.44 - 1.00 mg/dL   Calcium 9.4 8.9 - 10.3 mg/dL   GFR, Estimated 36 (L) >60 mL/min   Anion gap 10 5 - 15  CBC     Status: None   Collection Time: 10/10/20 11:03 AM  Result Value Ref Range   WBC 5.1 4.0 -  10.5 K/uL   RBC 4.37 3.87 - 5.11 MIL/uL   Hemoglobin 12.9 12.0 - 15.0 g/dL   HCT 38.4 36.0 - 46.0 %   MCV 87.9 80.0 - 100.0 fL   MCH 29.5 26.0 - 34.0 pg   MCHC 33.6 30.0 - 36.0 g/dL   RDW 12.3 11.5 - 15.5 %   Platelets 150 150 - 400 K/uL   nRBC 0.0 0.0 - 0.2 %  Troponin I (High Sensitivity)     Status: None   Collection Time: 10/10/20 11:03 AM  Result Value Ref Range   Troponin I (High Sensitivity) 7 <18 ng/L  D-dimer, quantitative     Status: Abnormal   Collection Time: 10/10/20 12:17 PM  Result Value Ref Range   D-Dimer, Quant 0.64 (H) 0.00 - 0.50 ug/mL-FEU  Hepatic function panel     Status: None   Collection Time: 10/10/20 12:17 PM  Result Value Ref Range   Total Protein 7.7 6.5 - 8.1 g/dL   Albumin 3.7 3.5 - 5.0 g/dL   AST 34 15 - 41 U/L   ALT 24 0 - 44 U/L   Alkaline Phosphatase 62 38 - 126 U/L   Total Bilirubin 0.7 0.3 - 1.2 mg/dL   Bilirubin, Direct <0.1 0.0 - 0.2 mg/dL   Indirect Bilirubin NOT CALCULATED 0.3 - 0.9 mg/dL  Protime-INR     Status: None   Collection Time: 10/10/20 12:17 PM  Result Value Ref Range   Prothrombin Time 13.9 11.4 - 15.2 seconds   INR 1.1 0.8 - 1.2  Ammonia     Status: None   Collection Time: 10/10/20 12:17 PM  Result Value Ref Range   Ammonia 11 9 - 35 umol/L  Troponin I (High Sensitivity)     Status: None   Collection Time: 10/10/20 12:55 PM  Result Value Ref Range   Troponin I (High Sensitivity) 7 <18 ng/L  Urinalysis, Complete w Microscopic Urine, Clean Catch     Status: Abnormal   Collection Time: 10/10/20  2:00 PM  Result Value Ref Range   Color, Urine STRAW (A) YELLOW   APPearance CLEAR (A) CLEAR   Specific Gravity, Urine 1.005 1.005 - 1.030   pH 6.0 5.0 - 8.0   Glucose, UA NEGATIVE NEGATIVE  mg/dL   Hgb urine dipstick NEGATIVE NEGATIVE   Bilirubin Urine NEGATIVE NEGATIVE   Ketones, ur NEGATIVE NEGATIVE mg/dL   Protein, ur NEGATIVE NEGATIVE mg/dL   Nitrite NEGATIVE NEGATIVE   Leukocytes,Ua NEGATIVE NEGATIVE   WBC, UA NONE SEEN 0 - 5 WBC/hpf   Bacteria, UA NONE SEEN NONE SEEN   Squamous Epithelial / LPF 0-5 0 - 5   Mucus PRESENT    ____________________________________________  EKG My review and personal interpretation at Time: 11:04   Indication: chestpain  Rate: 70  Rhythm: sinus Axis: normal Other: normal intervals, no stemi ____________________________________________  RADIOLOGY  I personally reviewed all radiographic images ordered to evaluate for the above acute complaints and reviewed radiology reports and findings.  These findings were personally discussed with the patient.  Please see medical record for radiology report.  ____________________________________________   PROCEDURES  Procedure(s) performed:  Procedures    Critical Care performed: no ____________________________________________   INITIAL IMPRESSION / ASSESSMENT AND PLAN / ED COURSE  Pertinent labs & imaging results that were available during my care of the patient were reviewed by me and considered in my medical decision making (see chart for details).   DDX: ACS, pericarditis, esophagitis, boerhaaves, pe, dissection, pna, bronchitis,  costochondritis   Edwina Grossberg is a 75 y.o. who presents to the ED with symptoms as described above.  Patient nontoxic-appearing presentation as described above.  Exam is reassuring.  EKG nonischemic but given age and risk factors will observe in the ER order serial enzymes.  Given her shortness of breath component will order D-dimer to further stratify for PE.  Chest x-ray without evidence of pneumonia have a lower suspicion for bronchitis that she does not have any wheezing on exam she is not hypoxic.  Does not seem consistent with dissection.  No hypoglycemia.   The patient will be placed on continuous pulse oximetry and telemetry for monitoring.  Laboratory evaluation will be sent to evaluate for the above complaints.     Clinical Course as of 10/10/20 1457  Thu Oct 10, 2020  1306 D-dimer is elevated.  Based on her presentation will order CTA. [PR]  0600 CT imaging is reassuring.  Serial enzymes negative.  Work-up overall is reassuring may be a little bit of dehydration therefore will give IV fluids.  Patient tolerating p.o. [PR]  1448 Patient feels well at this point.  Family at bedside.  No additional concerns.  Given reassuring work-up I think she is appropriate for outpatient follow-up.  Patient plans to contact her cardiologist for clinic appointment.  We discussed signs and symptoms for which she should return to the ER. [PR]    Clinical Course User Index [PR] Merlyn Lot, MD    The patient was evaluated in Emergency Department today for the symptoms described in the history of present illness. He/she was evaluated in the context of the global COVID-19 pandemic, which necessitated consideration that the patient might be at risk for infection with the SARS-CoV-2 virus that causes COVID-19. Institutional protocols and algorithms that pertain to the evaluation of patients at risk for COVID-19 are in a state of rapid change based on information released by regulatory bodies including the CDC and federal and state organizations. These policies and algorithms were followed during the patient's care in the ED.  As part of my medical decision making, I reviewed the following data within the [facility] notes reviewed and incorporated, Labs reviewed, notes from prior ED visits and [place] Controlled Substance Database   ____________________________________________   FINAL CLINICAL IMPRESSION(S) / ED DIAGNOSES  Final diagnoses:  Atypical chest pain      NEW MEDICATIONS STARTED DURING THIS VISIT:  New Prescriptions   No  medications on file     Note:  This document was prepared using Dragon voice recognition software and may include unintentional dictation errors.    Merlyn Lot, MD 10/10/20 [phone]

## 2020-10-10 NOTE — ED Triage Notes (Signed)
Pt to ED [facility] from home for chest pressure that started last night with shob. Called E[facility] for hypoglycemia, [facility] 180 for eMS, pt eating pb crackers on arrival.  Denies N/V Speaking in complete sentences, NAD noted, RR unlabored  Pt unable to reach sig pad, verbalizes understanding and agreement of MSE waiver

## 2020-10-10 NOTE — ED Notes (Signed)
With pt approval, this RN updated pt's daughter, Rosann Auerbach, on plan of care.

## 2021-01-18 LAB — EXTERNAL GENERIC LAB PROCEDURE: COLOGUARD: NEGATIVE

## 2021-01-18 LAB — COLOGUARD: COLOGUARD: NEGATIVE

## 2021-07-09 ENCOUNTER — Other Ambulatory Visit: Payer: Self-pay

## 2021-07-09 NOTE — Patient Outreach (Signed)
Received a call from Ms. Charlotte Herrera a [facility] patient. She stated she is needing someone to come to her house to give her a Repatha shot on Jan 23 in the afternoon.   Following [facility] workflow I have sent this referral to their Care Management group through email [email or web address].     [facility], [place], [place] [facility] [phone]

## 2021-07-21 ENCOUNTER — Other Ambulatory Visit: Payer: Self-pay | Admitting: Family Medicine

## 2021-07-21 DIAGNOSIS — Z1231 Encounter for screening mammogram for malignant neoplasm of breast: Secondary | ICD-10-CM

## 2021-09-24 ENCOUNTER — Ambulatory Visit
Admission: RE | Admit: 2021-09-24 | Discharge: 2021-09-24 | Disposition: A | Discharge status: Home / Self Care | Payer: PRIVATE HEALTH INSURANCE | Source: Ambulatory Visit | Attending: Family Medicine | Admitting: Family Medicine

## 2021-09-24 DIAGNOSIS — Z1231 Encounter for screening mammogram for malignant neoplasm of breast: Secondary | ICD-10-CM | POA: Insufficient documentation

## 2021-12-16 ENCOUNTER — Ambulatory Visit: Payer: Medicare Other | Admitting: Podiatry

## 2021-12-16 DIAGNOSIS — E0843 Diabetes mellitus due to underlying condition with diabetic autonomic (poly)neuropathy: Secondary | ICD-10-CM | POA: Diagnosis not present

## 2021-12-16 DIAGNOSIS — B351 Tinea unguium: Secondary | ICD-10-CM | POA: Diagnosis not present

## 2021-12-16 DIAGNOSIS — M79675 Pain in left toe(s): Secondary | ICD-10-CM | POA: Diagnosis not present

## 2021-12-16 DIAGNOSIS — M2041 Other hammer toe(s) (acquired), right foot: Secondary | ICD-10-CM | POA: Diagnosis not present

## 2021-12-16 DIAGNOSIS — E119 Type 2 diabetes mellitus without complications: Secondary | ICD-10-CM

## 2021-12-16 DIAGNOSIS — M2141 Flat foot [pes planus] (acquired), right foot: Secondary | ICD-10-CM

## 2021-12-16 DIAGNOSIS — M79674 Pain in right toe(s): Secondary | ICD-10-CM

## 2021-12-16 DIAGNOSIS — M2142 Flat foot [pes planus] (acquired), left foot: Secondary | ICD-10-CM

## 2021-12-16 NOTE — Progress Notes (Signed)
   SUBJECTIVE Patient with a history of diabetes mellitus presents to office today complaining of elongated, thickened nails that cause pain while ambulating in shoes.  Patient is unable to trim their own nails.  Patient is also requesting diabetic shoes today.  She states that she has a written prescription from her PCP for diabetic shoes.  Patient is here for further evaluation and treatment.   Past Medical History:  Diagnosis Date   Allergy    Arthritis    hands, shoulders, knees, feet   Calcification of abdominal aorta ([place]) 09/03/2017   CT scan [facility] Feb 2019   Cirrhosis ([place]) 09/03/2017   Chest CT [facility] Feb 2019   Depression    Diabetes mellitus, type 2 ([place])    Enlarged liver    Family history of adverse reaction to anesthesia    Mother - PONV   Fibromyalgia    Fibromyalgia affecting multiple sites    pinched nerve in back of neck   GERD (gastroesophageal reflux disease)    Headache    Hyperlipidemia    Hypertension    Hypothyroidism    Motion sickness    PONV (postoperative nausea and vomiting)    Stroke ([place])    Thyroid disease    TIA (transient ischemic attack)    Vertigo    after cataract surgery    OBJECTIVE General Patient is awake, alert, and oriented x 3 and in no acute distress. Derm Skin is dry and supple bilateral. Negative open lesions or macerations. Remaining integument unremarkable. Nails are tender, [place], thickened and dystrophic with subungual debris, consistent with onychomycosis, 1-5 bilateral. No signs of infection noted. Vasc  DP and PT pedal pulses palpable bilaterally. Temperature gradient within normal limits.  Neuro Epicritic and protective threshold sensation diminished bilaterally.  Musculoskeletal Exam clinical evidence of a hammertoe deformity contracture noted to the right second digit as well as pes planovalgus with collapse of the medial longitudinal arch with weightbearing  ASSESSMENT 1. Diabetes Mellitus w/ peripheral neuropathy 2.  Pain  due to onychomycosis of toenails bilateral 3.  Hammertoe right second digit 4.  Pes planovalgus deformity bilateral  PLAN OF CARE 1. Patient evaluated today.  Comprehensive diabetic foot exam performed today 2. Instructed to maintain good pedal hygiene and foot care. Stressed importance of controlling blood sugar.  3. Mechanical debridement of nails 1-5 bilaterally performed using a nail nipper. Filed with dremel without incident.  4.  Appointment with Pedorthist for diabetic shoes and insoles.  Order placed  5.  Return to clinic in 3 mos.     Edrick Kins, DPM [facility]  Dr. Edrick Kins, DPM    [address], [place] [address]                Office [phone]  Fax [phone]

## 2022-01-09 ENCOUNTER — Ambulatory Visit (INDEPENDENT_AMBULATORY_CARE_PROVIDER_SITE_OTHER): Payer: Medicare Other | Admitting: Podiatry

## 2022-01-09 DIAGNOSIS — E0843 Diabetes mellitus due to underlying condition with diabetic autonomic (poly)neuropathy: Secondary | ICD-10-CM | POA: Diagnosis not present

## 2022-01-09 NOTE — Progress Notes (Signed)
Patient presents for Diabetic shoe inserts only.  She stated she did not want to get the shoes because she has already bought a new pair of shoes.  I molded her feet.  I told her we would obtain the authorization for the inserts.  I told her we will contact her whenever the inserts come back and schedule her for an appointment to pick them up.

## 2022-01-27 ENCOUNTER — Telehealth: Payer: Self-pay | Admitting: *Deleted

## 2022-01-27 NOTE — Telephone Encounter (Signed)
Patient is calling for the status of her inserts, was measures a few weeks ago,explained that it usually takes up to 6 weeks for them to come back and someone from our office will contact to schedule an appointment for pick up.

## 2022-02-24 NOTE — Telephone Encounter (Signed)
Pt is calling to check the status of her orthotics  Please advise

## 2022-03-06 NOTE — Telephone Encounter (Signed)
Returned call to pt but unable to leave voicemail. At this time we have not received the paperwork from the treating physician so the order is still on hold.

## 2022-03-19 ENCOUNTER — Ambulatory Visit: Payer: Medicare Other | Admitting: Podiatry

## 2022-03-19 ENCOUNTER — Encounter: Payer: Self-pay | Admitting: Podiatry

## 2022-03-19 DIAGNOSIS — E0843 Diabetes mellitus due to underlying condition with diabetic autonomic (poly)neuropathy: Secondary | ICD-10-CM

## 2022-03-19 DIAGNOSIS — B351 Tinea unguium: Secondary | ICD-10-CM | POA: Diagnosis not present

## 2022-03-19 DIAGNOSIS — M79675 Pain in left toe(s): Secondary | ICD-10-CM | POA: Diagnosis not present

## 2022-03-19 DIAGNOSIS — M79674 Pain in right toe(s): Secondary | ICD-10-CM | POA: Diagnosis not present

## 2022-03-19 NOTE — Progress Notes (Signed)
This patient returns to my office for at risk foot care.  This patient requires this care by a professional since this patient will be at risk due to having diabetic neuropathy and coagulation defect.  This patient is unable to cut nails himself since the patient cannot reach his nails.These nails are painful walking and wearing shoes.  This patient presents for at risk foot care today.  General Appearance  Alert, conversant and in no acute stress.  Vascular  Dorsalis pedis and posterior tibial  pulses are palpable  bilaterally.  Capillary return is within normal limits  bilaterally. Temperature is within normal limits  bilaterally.  Neurologic  Senn-Weinstein monofilament wire test within normal limits  bilaterally. Muscle power within normal limits bilaterally.  Nails Thick disfigured discolored nails with subungual debris  from hallux to fifth toes bilaterally. No evidence of bacterial infection or drainage bilaterally.  Orthopedic  No limitations of motion  feet .  No crepitus or effusions noted.  No bony pathology or digital deformities noted.  Skin  normotropic skin with no porokeratosis noted bilaterally.  No signs of infections or ulcers noted.     Onychomycosis  Pain in right toes  Pain in left toes  Consent was obtained for treatment procedures.   Mechanical debridement of nails 1-5  bilaterally performed with a nail nipper.  Filed with dremel without incident.    Return office visit    3 months                  Told patient to return for periodic foot care and evaluation due to potential at risk complications.   Gardiner Barefoot DPM

## 2022-03-20 ENCOUNTER — Telehealth: Payer: Self-pay | Admitting: Podiatry

## 2022-03-20 NOTE — Telephone Encounter (Signed)
Pt called checking on the status of her inserts.  Please advise

## 2022-04-01 NOTE — Telephone Encounter (Signed)
Spoke with pt and informed her that forms were just received and [facility] lab has been notified to release and rush her order.

## 2022-04-01 NOTE — Telephone Encounter (Signed)
Pt is calling about the status of her orthotics and is very upset that no one has returned any of her calls. She states that her PCP  has sent over the documentation to have this ordered. Please check the status and give the pt a call. She states she has already paid for the orthotics and really need to get them as she is in a lot of pain in her feet without them. Pt has asked to speak with the administrator of the practice and has threatened to report the practice of fraud.  Please advise

## 2022-04-14 ENCOUNTER — Ambulatory Visit (INDEPENDENT_AMBULATORY_CARE_PROVIDER_SITE_OTHER): Payer: Medicare Other | Admitting: Podiatry

## 2022-04-14 DIAGNOSIS — E0843 Diabetes mellitus due to underlying condition with diabetic autonomic (poly)neuropathy: Secondary | ICD-10-CM

## 2022-04-14 DIAGNOSIS — M79674 Pain in right toe(s): Secondary | ICD-10-CM

## 2022-04-14 DIAGNOSIS — B351 Tinea unguium: Secondary | ICD-10-CM

## 2022-04-14 DIAGNOSIS — M79675 Pain in left toe(s): Secondary | ICD-10-CM

## 2022-04-14 NOTE — Progress Notes (Signed)
Diabetic shoes were dispensed and functioning well.  Patient has signed for him that she received it.

## 2022-07-02 ENCOUNTER — Ambulatory Visit: Payer: Medicare Other | Admitting: Podiatry

## 2022-07-07 ENCOUNTER — Telehealth: Payer: Self-pay | Admitting: Podiatry

## 2022-07-07 NOTE — Telephone Encounter (Signed)
Pt called to get an appt for her foot. States her hammertoe is red and purple from rubbing against the top part of her shoe while wearing the custom orthotic. She has stopped wearing the orthotic and is now in an open surgical shoe that was given to her in office. She states that its very painful and swollen and she is a diabetic. Offered pt appt in the [place] location for sooner appt, pt declined. Pt scheduled with Dr Posey Pronto on 1/19 at 1130.

## 2022-07-17 ENCOUNTER — Ambulatory Visit: Payer: 59 | Admitting: Podiatry

## 2022-07-17 VITALS — BP 136/78

## 2022-07-17 DIAGNOSIS — M2041 Other hammer toe(s) (acquired), right foot: Secondary | ICD-10-CM

## 2022-07-17 NOTE — Progress Notes (Signed)
Subjective:  Patient ID: Charlotte Herrera, female    DOB: 05/07/1946,  MRN: [id number]  Chief Complaint  Patient presents with   Toe Pain    77 y.o. female presents with the above complaint.  Patient presents with right second digit hammertoe contracture with pain.  Patient states that is somewhat painful.  She wanted discuss conservative treatment options.  She would like to decrease some of her pain.  She has tried some shoe gear modification which has not helped.  She denies any other acute complaints.   Review of Systems: Negative except as noted in the HPI. Denies N/V/F/Ch.  Past Medical History:  Diagnosis Date   Allergy    Arthritis    hands, shoulders, knees, feet   Calcification of abdominal aorta ([place]) 09/03/2017   CT scan [facility] Feb 2019   Cirrhosis ([place]) 09/03/2017   Chest CT [facility] Feb 2019   Depression    Diabetes mellitus, type 2 ([place])    Enlarged liver    Family history of adverse reaction to anesthesia    Mother - PONV   Fibromyalgia    Fibromyalgia affecting multiple sites    pinched nerve in back of neck   GERD (gastroesophageal reflux disease)    Headache    Hyperlipidemia    Hypertension    Hypothyroidism    Motion sickness    PONV (postoperative nausea and vomiting)    Stroke ([facility])    Thyroid disease    TIA (transient ischemic attack)    Vertigo    after cataract surgery    Current Outpatient Medications:    Blood Glucose Monitoring Suppl (ONE TOUCH ULTRA MINI) w/Device KIT, 1 Device by Does not apply route 2 (two) times daily., Disp: 1 each, Rfl: 0   buPROPion (WELLBUTRIN XL) 150 MG 24 hr tablet, Take 150 mg by mouth daily., Disp: , Rfl:    Cholecalciferol (VITAMIN D-3) 25 MCG (1000 UT) CAPS, Take by mouth., Disp: , Rfl:    clopidogrel (PLAVIX) 75 MG tablet, Take 1 tablet (75 mg total) by mouth daily., Disp: 90 tablet, Rfl: 3   Continuous Blood Gluc Sensor (DEXCOM G6 SENSOR) MISC, 1 Device by Does not apply route every 14 (fourteen) days., Disp: 6 each, Rfl:  3   Cyanocobalamin (VITAMIN B 12 PO), Take by mouth., Disp: , Rfl:    diclofenac sodium (VOLTAREN) 1 % GEL, Apply 2 g topically 2 (two) times daily as needed., Disp: 100 g, Rfl: 2   fluticasone (FLONASE) 50 MCG/ACT nasal spray, SHAKE LIQUID AND USE 2 SPRAYS IN EACH NOSTRIL DAILY, Disp: 16 g, Rfl: 11   gabapentin (NEURONTIN) 300 MG capsule, TAKE 1 CAPSULE(300 MG) BY MOUTH AT BEDTIME, Disp: 90 capsule, Rfl: 1   glucose blood (ONE TOUCH ULTRA TEST) test strip, TEST twice a day, Disp: 180 each, Rfl: 3   hydrOXYzine (VISTARIL) 25 MG capsule, Take 1 capsule (25 mg total) by mouth daily as needed for anxiety., Disp: 30 capsule, Rfl: 0   Insulin Degludec-Liraglutide (XULTOPHY) 100-3.6 UNIT-MG/ML SOPN, Inject 40 Units into the skin daily., Disp: 15 mL, Rfl: 2   Insulin Glargine (LANTUS SOLOSTAR) 100 UNIT/ML Solostar Pen, Inject 40 Units into the skin at bedtime., Disp: 5 pen, Rfl: 11   Insulin Pen Needle (BD PEN NEEDLE MICRO U/F) 32G X 6 MM MISC, USE AS DIRECTED once AT BEDTIME for insulin administration DX:E11.42 Lon:99 months, Disp: 100 each, Rfl: 3   ketoconazole (NIZORAL) 2 % cream, APPLY TOPICALLY TO THE AFFECTED AREA DAILY AS  NEEDED FOR IRRITATION( WHERE NEEDED), Disp: 60 g, Rfl: 2   levothyroxine (SYNTHROID) 50 MCG tablet, Take 1 tablet (50 mcg total) by mouth daily before breakfast., Disp: 90 tablet, Rfl: 1   losartan (COZAAR) 25 MG tablet, TAKE 1 TABLET(25 MG) BY MOUTH DAILY, Disp: 90 tablet, Rfl: 0   metFORMIN (GLUCOPHAGE) 500 MG tablet, TAKE 1 TO 2 TABLETS BY MOUTH TWICE DAILY WITH MEALS. MAXIMUM DAILY DOSE IS 3 TABLETS, Disp: 270 tablet, Rfl: 0   ondansetron (ZOFRAN ODT) 4 MG disintegrating tablet, Take 1 tablet (4 mg total) by mouth every 8 (eight) hours as needed., Disp: 20 tablet, Rfl: 0   OneTouch Delica Lancets 38G MISC, 33 g by Does not apply route 2 (two) times daily. Test twice a day as directed, Disp: 100 each, Rfl: 2   pantoprazole (PROTONIX) 20 MG tablet, Take 1 tablet (20 mg total) by  mouth daily as needed for heartburn or indigestion., Disp: 90 tablet, Rfl: 1   pravastatin (PRAVACHOL) 20 MG tablet, Take 1 tablet (20 mg total) by mouth at bedtime. For cholesterol, Disp: 90 tablet, Rfl: 3   traZODone (DESYREL) 50 MG tablet, TAKE 1/2 TO 1 TABLET(25 TO 50 MG) BY MOUTH AT BEDTIME AS NEEDED FOR SLEEP, Disp: 30 tablet, Rfl: 0  Social History   Tobacco Use  Smoking Status Never  Smokeless Tobacco Never  Tobacco Comments   smoking cessation materials not required    Allergies  Allergen Reactions   Aspirin    Duloxetine Hcl    Penicillins    Codeine Nausea And Vomiting and Rash   Lyrica [Pregabalin] Nausea And Vomiting   Sulfa Antibiotics Swelling   Objective:   Vitals:   07/17/22 1137  BP: 136/78   There is no height or weight on file to calculate BMI. Constitutional Well developed. Well nourished.  Vascular Dorsalis pedis pulses palpable bilaterally. Posterior tibial pulses palpable bilaterally. Capillary refill normal to all digits.  No cyanosis or clubbing noted. Pedal hair growth normal.  Neurologic Normal speech. Oriented to person, place, and time. Epicritic sensation to light touch grossly present bilaterally.  Dermatologic Nails well groomed and normal in appearance. No open wounds. No skin lesions.  Orthopedic: Right second digit hammertoe contracture.  Semiflexible in nature.  No open wounds or lesions noted.   Radiographs: None Assessment:   1. Hammertoe of second toe of right foot    Plan:  Patient was evaluated and treated and all questions answered.  Right second digit hammertoe contracture -All questions and concerns were discussed with the patient in extensive detail.  Given the amount of contracture that is present she will benefit from surgical correction of the hammertoe however for now we will focus on conservative care with shoe gear modification padding.  Toe protectors were dispensed if any foot and ankle issues in the future she  will come back and see me.  She states understanding.  No follow-ups on file.

## 2022-08-05 ENCOUNTER — Encounter: Payer: Self-pay | Admitting: Family Medicine

## 2022-08-05 DIAGNOSIS — Z1231 Encounter for screening mammogram for malignant neoplasm of breast: Secondary | ICD-10-CM

## 2022-09-28 ENCOUNTER — Other Ambulatory Visit: Payer: Self-pay | Admitting: Family Medicine

## 2022-09-28 DIAGNOSIS — Z1231 Encounter for screening mammogram for malignant neoplasm of breast: Secondary | ICD-10-CM

## 2022-10-16 ENCOUNTER — Ambulatory Visit
Admission: RE | Admit: 2022-10-16 | Discharge: 2022-10-16 | Disposition: A | Discharge status: Home / Self Care | Payer: Medicare HMO | Source: Ambulatory Visit | Attending: Family Medicine | Admitting: Family Medicine

## 2022-10-16 DIAGNOSIS — Z1231 Encounter for screening mammogram for malignant neoplasm of breast: Secondary | ICD-10-CM | POA: Insufficient documentation

## 2022-11-17 ENCOUNTER — Emergency Department
Admission: EM | Admit: 2022-11-17 | Discharge: 2022-11-18 | Discharge status: Against Medical Advice | Payer: Medicare HMO | Attending: Emergency Medicine | Admitting: Emergency Medicine

## 2022-11-17 ENCOUNTER — Encounter: Payer: Self-pay | Admitting: *Deleted

## 2022-11-17 ENCOUNTER — Other Ambulatory Visit: Payer: Self-pay

## 2022-11-17 DIAGNOSIS — E162 Hypoglycemia, unspecified: Secondary | ICD-10-CM | POA: Diagnosis not present

## 2022-11-17 DIAGNOSIS — R197 Diarrhea, unspecified: Secondary | ICD-10-CM | POA: Diagnosis present

## 2022-11-17 DIAGNOSIS — M542 Cervicalgia: Secondary | ICD-10-CM | POA: Diagnosis not present

## 2022-11-17 DIAGNOSIS — G8929 Other chronic pain: Secondary | ICD-10-CM | POA: Diagnosis not present

## 2022-11-17 DIAGNOSIS — Z5321 Procedure and treatment not carried out due to patient leaving prior to being seen by health care provider: Secondary | ICD-10-CM | POA: Insufficient documentation

## 2022-11-17 DIAGNOSIS — R42 Dizziness and giddiness: Secondary | ICD-10-CM | POA: Insufficient documentation

## 2022-11-17 LAB — URINALYSIS, ROUTINE W REFLEX MICROSCOPIC
Bacteria, UA: NONE SEEN
Bilirubin Urine: NEGATIVE
Glucose, UA: NEGATIVE mg/dL
Hgb urine dipstick: NEGATIVE
Ketones, ur: NEGATIVE mg/dL
Nitrite: NEGATIVE
Protein, ur: NEGATIVE mg/dL
Specific Gravity, Urine: 1.016 (ref 1.005–1.030)
pH: 5 (ref 5.0–8.0)

## 2022-11-17 LAB — COMPREHENSIVE METABOLIC PANEL
ALT: 17 U/L (ref 0–44)
AST: 25 U/L (ref 15–41)
Albumin: 3.8 g/dL (ref 3.5–5.0)
Alkaline Phosphatase: 64 U/L (ref 38–126)
Anion gap: 7 (ref 5–15)
BUN: 23 mg/dL (ref 8–23)
CO2: 22 mmol/L (ref 22–32)
Calcium: 8.8 mg/dL — ABNORMAL LOW (ref 8.9–10.3)
Chloride: 111 mmol/L (ref 98–111)
Creatinine, Ser: 1.65 mg/dL — ABNORMAL HIGH (ref 0.44–1.00)
GFR, Estimated: 32 mL/min — ABNORMAL LOW (ref >60)
Glucose, Bld: 134 mg/dL — ABNORMAL HIGH (ref 70–99)
Potassium: 4 mmol/L (ref 3.5–5.1)
Sodium: 140 mmol/L (ref 135–145)
Total Bilirubin: 0.6 mg/dL (ref 0.3–1.2)
Total Protein: 7.3 g/dL (ref 6.5–8.1)

## 2022-11-17 LAB — CBG MONITORING, ED: Glucose-Capillary: 118 mg/dL — ABNORMAL HIGH (ref 70–99)

## 2022-11-17 LAB — CBC WITH DIFFERENTIAL/PLATELET
Abs Immature Granulocytes: 0.02 10*3/uL (ref 0.00–0.07)
Basophils Absolute: 0 10*3/uL (ref 0.0–0.1)
Basophils Relative: 1 %
Eosinophils Absolute: 0.2 10*3/uL (ref 0.0–0.5)
Eosinophils Relative: 3 %
HCT: 39.9 % (ref 36.0–46.0)
Hemoglobin: 13.3 g/dL (ref 12.0–15.0)
Immature Granulocytes: 0 %
Lymphocytes Relative: 33 %
Lymphs Abs: 2.3 10*3/uL (ref 0.7–4.0)
MCH: 29.9 pg (ref 26.0–34.0)
MCHC: 33.3 g/dL (ref 30.0–36.0)
MCV: 89.7 fL (ref 80.0–100.0)
Monocytes Absolute: 0.5 10*3/uL (ref 0.1–1.0)
Monocytes Relative: 7 %
Neutro Abs: 3.9 10*3/uL (ref 1.7–7.7)
Neutrophils Relative %: 56 %
Platelets: 148 10*3/uL — ABNORMAL LOW (ref 150–400)
RBC: 4.45 MIL/uL (ref 3.87–5.11)
RDW: 12.4 % (ref 11.5–15.5)
WBC: 6.9 10*3/uL (ref 4.0–10.5)
nRBC: 0 % (ref 0.0–0.2)

## 2022-11-17 LAB — LIPASE, BLOOD: Lipase: 27 U/L (ref 11–51)

## 2022-11-17 NOTE — ED Triage Notes (Signed)
BIB family from home for diarrhea, associated with low blood sugars, dizziness. Onset 1 week ago. Denies fever, syncope, sob, NV. Mentions sharp abd gas pain with BMs.  PCP instructed to come to ED. Mentions fall 4/20. Alert, NAD, calm, interactive. Mentions chronic neck pain.

## 2022-11-18 NOTE — ED Notes (Signed)
No answer when called several times from lobby 

## 2024-01-21 LAB — COLOGUARD: COLOGUARD: POSITIVE — AB

## 2024-05-11 ENCOUNTER — Other Ambulatory Visit: Payer: Self-pay

## 2024-05-11 ENCOUNTER — Emergency Department

## 2024-05-11 ENCOUNTER — Emergency Department
Admission: EM | Admit: 2024-05-11 | Discharge: 2024-05-11 | Disposition: A | Discharge status: Home / Self Care | Attending: Emergency Medicine | Admitting: Emergency Medicine

## 2024-05-11 DIAGNOSIS — S81811A Laceration without foreign body, right lower leg, initial encounter: Secondary | ICD-10-CM | POA: Diagnosis not present

## 2024-05-11 DIAGNOSIS — W230XXA Caught, crushed, jammed, or pinched between moving objects, initial encounter: Secondary | ICD-10-CM | POA: Diagnosis not present

## 2024-05-11 DIAGNOSIS — Z8673 Personal history of transient ischemic attack (TIA), and cerebral infarction without residual deficits: Secondary | ICD-10-CM | POA: Diagnosis not present

## 2024-05-11 DIAGNOSIS — S8991XA Unspecified injury of right lower leg, initial encounter: Secondary | ICD-10-CM | POA: Diagnosis present

## 2024-05-11 NOTE — Discharge Instructions (Addendum)
 The x-ray of your right lower leg was negative for fracture.  Please wash the wound on your leg with soap and water  daily.  You can apply the antibiotic ointment you were previously prescribed and then cover with a bandage. Watch for signs of infection including redness, warmth, swelling, pain and pus drainage.  If you develop any of these please return to the ED, urgent care or your primary care provider.

## 2024-05-11 NOTE — ED Notes (Signed)
 See triage note  Presents with pain to right lower leg  States she had a car door shut on her leg

## 2024-05-11 NOTE — ED Triage Notes (Signed)
 Patient states she shut car door on right lower leg; laceration with bleeding controlled.

## 2024-05-11 NOTE — ED Provider Notes (Signed)
 [facility] Provider Note    Event Date/Time   First MD Initiated Contact with Patient 05/11/24 1558     (approximate)   History   Leg Pain   HPI  Charlotte Herrera is a 78 y.o. female with PMH of diabetes with peripheral neuropathy, cirrhosis, stroke, TIA, fibromyalgia presents for evaluation of right lower leg pain after shutting it in a car door today.  Patient reports there was a cut to her leg that was bleeding quite a bit.  She cleaned it at home and put antibiotic ointment on it.  She came to the emergency department to make sure she did not need stitches.  She has been able to walk on the leg.      Physical Exam   Triage Vital Signs: ED Triage Vitals  Encounter Vitals Group     BP 05/11/24 1533 (!) 117/102     Girls Systolic BP Percentile --      Girls Diastolic BP Percentile --      Boys Systolic BP Percentile --      Boys Diastolic BP Percentile --      Pulse Rate 05/11/24 1533 88     Resp 05/11/24 1533 20     Temp 05/11/24 1533 98.2 F (36.8 C)     Temp Source 05/11/24 1533 Oral     SpO2 05/11/24 1533 98 %     Weight 05/11/24 1534 163 lb (73.9 kg)     Height 05/11/24 1534 5' 3 (1.6 m)     Head Circumference --      Peak Flow --      Pain Score --      Pain Loc --      Pain Education --      Exclude from Growth Chart --     Most recent vital signs: Vitals:   05/11/24 1533  BP: (!) 117/102  Pulse: 88  Resp: 20  Temp: 98.2 F (36.8 C)  SpO2: 98%   General: Awake, no distress.  CV:  Good peripheral perfusion.  Resp:  Normal effort.  Abd:  No distention.  Other:  Approximately 2 cm Werber skin tear to the lateral right lower leg, no active bleeding, is tender to palpation at the skin tear site, otherwise no tenderness, dorsalis pedis pulse 2+ and regular, patient able to range the leg without difficulty, decrease sensation in the foot   ED Results / Procedures / Treatments   Labs (all labs ordered are listed, but only abnormal  results are displayed) Labs Reviewed - No data to display   RADIOLOGY  Right tibia-fibula x-ray obtained, I interpreted the images as well as reviewed the radiologist report, images are negative for fractures.  PROCEDURES:  Critical Care performed: No  Procedures   MEDICATIONS ORDERED IN ED: Medications - No data to display   IMPRESSION / MDM / ASSESSMENT AND PLAN / ED COURSE  I reviewed the triage vital signs and the nursing notes.                             78 year old female presents for evaluation of right lower leg pain.  Diastolic blood pressure little bit elevated, otherwise vital signs are stable and patient NAD on exam.  Differential diagnosis includes, but is not limited to, laceration, abrasion, skin tear, fracture, contusion.  Patient's presentation is most consistent with acute complicated illness / injury requiring diagnostic workup.  X-ray of the  tibia-fibula is negative for fracture.  Patient has a skin tear on the right lateral lower leg with no active bleeding.  Do not feel that sutures are indicated given it is superficial and no longer actively bleeding.  I am also concerned that if I were to try to stitch this close the sutures would rip through the skin.  Wound was cleaned and covered with a bandage.  Advised patient to continue applying triple antibiotic ointment over the area at home.  Patient voiced understanding, questions were answered and she was stable at discharge.      FINAL CLINICAL IMPRESSION(S) / ED DIAGNOSES   Final diagnoses:  Skin tear of lower leg without complication, right, initial encounter     Rx / DC Orders   ED Discharge Orders     None        Note:  This document was prepared using Dragon voice recognition software and may include unintentional dictation errors.   Cleaster Tinnie LABOR, PA-C 05/11/24 1631    Claudene Rover, MD 05/11/24 KENITH
